# Patient Record
Sex: Female | Born: 1980 | Race: White | Hispanic: No | Marital: Single | Smoking: Current every day smoker
Health system: Southern US, Community
[De-identification: ages and names within clinical notes are randomized; demographics above are authoritative.]

## PROBLEM LIST (undated history)

## (undated) DIAGNOSIS — R569 Unspecified convulsions: Secondary | ICD-10-CM

## (undated) DIAGNOSIS — G4700 Insomnia, unspecified: Secondary | ICD-10-CM

## (undated) DIAGNOSIS — F1910 Other psychoactive substance abuse, uncomplicated: Secondary | ICD-10-CM

## (undated) DIAGNOSIS — F329 Major depressive disorder, single episode, unspecified: Secondary | ICD-10-CM

## (undated) DIAGNOSIS — F419 Anxiety disorder, unspecified: Secondary | ICD-10-CM

## (undated) DIAGNOSIS — F32A Depression, unspecified: Secondary | ICD-10-CM

## (undated) HISTORY — PX: SHOULDER SURGERY: SHX246

## (undated) HISTORY — PX: NO PAST SURGERIES: SHX2092

## (undated) HISTORY — PX: WISDOM TOOTH EXTRACTION: SHX21

---

## 2011-07-19 ENCOUNTER — Emergency Department (HOSPITAL_BASED_OUTPATIENT_CLINIC_OR_DEPARTMENT_OTHER)
Admission: EM | Admit: 2011-07-19 | Discharge: 2011-07-19 | Disposition: A | Discharge status: Home / Self Care | Payer: BC Managed Care – PPO | Attending: Emergency Medicine | Admitting: Emergency Medicine

## 2011-07-19 ENCOUNTER — Encounter: Payer: Self-pay | Admitting: *Deleted

## 2011-07-19 DIAGNOSIS — F411 Generalized anxiety disorder: Secondary | ICD-10-CM | POA: Insufficient documentation

## 2011-07-19 DIAGNOSIS — F17200 Nicotine dependence, unspecified, uncomplicated: Secondary | ICD-10-CM | POA: Insufficient documentation

## 2011-07-19 DIAGNOSIS — F419 Anxiety disorder, unspecified: Secondary | ICD-10-CM

## 2011-07-19 DIAGNOSIS — Z760 Encounter for issue of repeat prescription: Secondary | ICD-10-CM | POA: Insufficient documentation

## 2011-07-19 HISTORY — DX: Insomnia, unspecified: G47.00

## 2011-07-19 HISTORY — DX: Anxiety disorder, unspecified: F41.9

## 2011-07-19 MED ORDER — CLONAZEPAM 2 MG PO TABS: 2.0000 mg | ORAL_TABLET | Freq: Two times a day (BID) | ORAL | Status: DC
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
## 2011-07-19 NOTE — ED Notes (Signed)
Pt requesting refill of clonopin. Pt states she has been out x 4 days. Pt tearful.

## 2011-07-19 NOTE — ED Provider Notes (Signed)
Medical screening examination/treatment/procedure(s) were conducted as a shared visit with non-physician practitioner(s) and myself.  I personally evaluated the patient during the encounter   Celene Kras, MD 07/19/11 [phone]

## 2011-11-11 ENCOUNTER — Encounter (HOSPITAL_BASED_OUTPATIENT_CLINIC_OR_DEPARTMENT_OTHER): Payer: Self-pay

## 2011-11-11 ENCOUNTER — Emergency Department (HOSPITAL_BASED_OUTPATIENT_CLINIC_OR_DEPARTMENT_OTHER)
Admission: EM | Admit: 2011-11-11 | Discharge: 2011-11-11 | Disposition: A | Discharge status: Home / Self Care | Payer: Self-pay | Attending: Emergency Medicine | Admitting: Emergency Medicine

## 2011-11-11 ENCOUNTER — Emergency Department (INDEPENDENT_AMBULATORY_CARE_PROVIDER_SITE_OTHER): Payer: Self-pay

## 2011-11-11 DIAGNOSIS — M549 Dorsalgia, unspecified: Secondary | ICD-10-CM

## 2011-11-11 DIAGNOSIS — R079 Chest pain, unspecified: Secondary | ICD-10-CM

## 2011-11-11 DIAGNOSIS — M25519 Pain in unspecified shoulder: Secondary | ICD-10-CM

## 2011-11-11 DIAGNOSIS — F411 Generalized anxiety disorder: Secondary | ICD-10-CM | POA: Insufficient documentation

## 2011-11-11 DIAGNOSIS — Z79899 Other long term (current) drug therapy: Secondary | ICD-10-CM | POA: Insufficient documentation

## 2011-11-11 DIAGNOSIS — F17200 Nicotine dependence, unspecified, uncomplicated: Secondary | ICD-10-CM | POA: Insufficient documentation

## 2011-11-11 LAB — BASIC METABOLIC PANEL
BUN: 17 mg/dL (ref 6–23)
Creatinine, Ser: 0.7 mg/dL (ref 0.50–1.10)
GFR calc Af Amer: >90 mL/min (ref >90)
GFR calc non Af Amer: >90 mL/min (ref >90)

## 2011-11-11 LAB — CBC
Hemoglobin: 12.2 g/dL (ref 12.0–15.0)
MCH: 32.4 pg (ref 26.0–34.0)
MCHC: 35 g/dL (ref 30.0–36.0)
RDW: 11.3 % — ABNORMAL LOW (ref 11.5–15.5)

## 2011-11-11 LAB — DIFFERENTIAL
Basophils Relative: 0 % (ref 0–1)
Eosinophils Absolute: 0 10*3/uL (ref 0.0–0.7)
Monocytes Absolute: 0.4 10*3/uL (ref 0.1–1.0)
Monocytes Relative: 8 % (ref 3–12)
Neutrophils Relative %: 75 % (ref 43–77)

## 2011-11-11 MED ORDER — DIAZEPAM 5 MG PO TABS: 5.0000 mg | ORAL_TABLET | Freq: Three times a day (TID) | ORAL | Status: AC | PRN

## 2011-11-11 MED ORDER — IOHEXOL 350 MG/ML SOLN
80.0000 mL | Freq: Once | INTRAVENOUS | Status: AC | PRN
Start: 2011-11-11 — End: 2011-11-11
  Administered 2011-11-11: 80 mL via INTRAVENOUS

## 2011-11-11 MED ORDER — OXYCODONE-ACETAMINOPHEN 5-325 MG PO TABS: 1.0000 | ORAL_TABLET | ORAL | Status: AC | PRN

## 2011-11-11 MED ORDER — OXYCODONE-ACETAMINOPHEN 5-325 MG PO TABS
1.0000 | ORAL_TABLET | Freq: Once | ORAL | Status: AC
  Administered 2011-11-11: 1 via ORAL
  Filled 2011-11-11: qty 1

## 2011-11-11 MED ORDER — METHOCARBAMOL 500 MG PO TABS
1000.0000 mg | ORAL_TABLET | Freq: Once | ORAL | Status: AC
  Administered 2011-11-11: 1000 mg via ORAL
  Filled 2011-11-11: qty 2

## 2011-11-11 MED ORDER — SODIUM CHLORIDE 0.9 % IV BOLUS (SEPSIS)
1000.0000 mL | Freq: Once | INTRAVENOUS | Status: AC
  Administered 2011-11-11: 1000 mL via INTRAVENOUS

## 2011-11-11 NOTE — ED Notes (Signed)
Patient transported to X-ray 
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
## 2011-11-11 NOTE — ED Notes (Signed)
Pt returned from CT.  States improvement after PO medication. PO fluids offered. Pt informed of plan of care.  Awaiting CT results.

## 2011-11-11 NOTE — ED Notes (Signed)
Patient transported to CT 
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
## 2011-11-11 NOTE — ED Notes (Signed)
MD at bedside. 

## 2012-11-18 ENCOUNTER — Emergency Department (HOSPITAL_COMMUNITY)
Admission: EM | Admit: 2012-11-18 | Discharge: 2012-11-18 | Disposition: A | Discharge status: Home / Self Care | Payer: Self-pay | Attending: Emergency Medicine | Admitting: Emergency Medicine

## 2012-11-18 ENCOUNTER — Emergency Department (HOSPITAL_COMMUNITY): Payer: Self-pay

## 2012-11-18 ENCOUNTER — Encounter (HOSPITAL_COMMUNITY): Payer: Self-pay | Admitting: Emergency Medicine

## 2012-11-18 DIAGNOSIS — F411 Generalized anxiety disorder: Secondary | ICD-10-CM | POA: Insufficient documentation

## 2012-11-18 DIAGNOSIS — S298XXA Other specified injuries of thorax, initial encounter: Secondary | ICD-10-CM | POA: Insufficient documentation

## 2012-11-18 DIAGNOSIS — T07XXXA Unspecified multiple injuries, initial encounter: Secondary | ICD-10-CM

## 2012-11-18 DIAGNOSIS — F17200 Nicotine dependence, unspecified, uncomplicated: Secondary | ICD-10-CM | POA: Insufficient documentation

## 2012-11-18 DIAGNOSIS — S6990XA Unspecified injury of unspecified wrist, hand and finger(s), initial encounter: Secondary | ICD-10-CM | POA: Insufficient documentation

## 2012-11-18 DIAGNOSIS — IMO0002 Reserved for concepts with insufficient information to code with codable children: Secondary | ICD-10-CM | POA: Insufficient documentation

## 2012-11-18 DIAGNOSIS — S0993XA Unspecified injury of face, initial encounter: Secondary | ICD-10-CM | POA: Insufficient documentation

## 2012-11-18 DIAGNOSIS — R0781 Pleurodynia: Secondary | ICD-10-CM

## 2012-11-18 MED ORDER — NAPROXEN 500 MG PO TABS: 500.0000 mg | ORAL_TABLET | Freq: Two times a day (BID) | ORAL | Status: DC

## 2012-11-18 MED ORDER — OXYCODONE-ACETAMINOPHEN 5-325 MG PO TABS
2.0000 | ORAL_TABLET | Freq: Once | ORAL | Status: AC
  Administered 2012-11-18: 2 via ORAL
  Filled 2012-11-18: qty 2

## 2012-11-18 MED ORDER — HYDROCODONE-ACETAMINOPHEN 5-325 MG PO TABS: 2.0000 | ORAL_TABLET | Freq: Four times a day (QID) | ORAL | Status: DC | PRN
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
## 2012-11-19 NOTE — ED Provider Notes (Signed)
Medical screening examination/treatment/procedure(s) were performed by non-physician practitioner and as supervising physician I was immediately available for consultation/collaboration.   Dione Booze, MD 11/19/12 [phone]

## 2012-12-03 ENCOUNTER — Emergency Department (HOSPITAL_COMMUNITY): Payer: Self-pay

## 2012-12-03 ENCOUNTER — Emergency Department (HOSPITAL_COMMUNITY)
Admission: EM | Admit: 2012-12-03 | Discharge: 2012-12-03 | Disposition: A | Discharge status: Home / Self Care | Payer: Self-pay | Attending: Emergency Medicine | Admitting: Emergency Medicine

## 2012-12-03 ENCOUNTER — Encounter (HOSPITAL_COMMUNITY): Payer: Self-pay | Admitting: *Deleted

## 2012-12-03 DIAGNOSIS — F17200 Nicotine dependence, unspecified, uncomplicated: Secondary | ICD-10-CM | POA: Insufficient documentation

## 2012-12-03 DIAGNOSIS — S8990XA Unspecified injury of unspecified lower leg, initial encounter: Secondary | ICD-10-CM | POA: Insufficient documentation

## 2012-12-03 DIAGNOSIS — M25561 Pain in right knee: Secondary | ICD-10-CM

## 2012-12-03 DIAGNOSIS — M25572 Pain in left ankle and joints of left foot: Secondary | ICD-10-CM

## 2012-12-03 DIAGNOSIS — S298XXA Other specified injuries of thorax, initial encounter: Secondary | ICD-10-CM | POA: Insufficient documentation

## 2012-12-03 DIAGNOSIS — Z79899 Other long term (current) drug therapy: Secondary | ICD-10-CM | POA: Insufficient documentation

## 2012-12-03 DIAGNOSIS — R0789 Other chest pain: Secondary | ICD-10-CM

## 2012-12-03 DIAGNOSIS — F411 Generalized anxiety disorder: Secondary | ICD-10-CM | POA: Insufficient documentation

## 2012-12-03 MED ORDER — HYDROCODONE-ACETAMINOPHEN 5-325 MG PO TABS
1.0000 | ORAL_TABLET | Freq: Once | ORAL | Status: AC
  Administered 2012-12-03: 1 via ORAL
  Filled 2012-12-03: qty 1

## 2012-12-03 MED ORDER — HYDROCODONE-ACETAMINOPHEN 5-325 MG PO TABS: 1.0000 | ORAL_TABLET | Freq: Four times a day (QID) | ORAL | Status: DC | PRN

## 2012-12-03 MED ORDER — IBUPROFEN 600 MG PO TABS: 600.0000 mg | ORAL_TABLET | Freq: Three times a day (TID) | ORAL | Status: DC | PRN

## 2012-12-03 MED ORDER — IBUPROFEN 400 MG PO TABS
600.0000 mg | ORAL_TABLET | Freq: Once | ORAL | Status: AC
  Administered 2012-12-03: 600 mg via ORAL
  Filled 2012-12-03: qty 1

## 2012-12-03 NOTE — Progress Notes (Signed)
Orthopedic Tech Progress Note Patient Details:  Rita Carey [birth year] [id number]  Ortho Devices Type of Ortho Device: Crutches Ortho Device/Splint Interventions: Application   Nikki Dom 12/03/2012, 6:24 PM
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
## 2012-12-03 NOTE — ED Notes (Signed)
Ortho paged. 
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
## 2013-07-03 ENCOUNTER — Encounter (HOSPITAL_COMMUNITY): Payer: Self-pay | Admitting: Emergency Medicine

## 2013-07-03 ENCOUNTER — Emergency Department (HOSPITAL_COMMUNITY)
Admission: EM | Admit: 2013-07-03 | Discharge: 2013-07-04 | Disposition: A | Discharge status: Other Acute Inpatient Hospital | Payer: BC Managed Care – PPO | Attending: Emergency Medicine | Admitting: Emergency Medicine

## 2013-07-03 DIAGNOSIS — G4700 Insomnia, unspecified: Secondary | ICD-10-CM | POA: Insufficient documentation

## 2013-07-03 DIAGNOSIS — F1910 Other psychoactive substance abuse, uncomplicated: Secondary | ICD-10-CM

## 2013-07-03 DIAGNOSIS — Z79899 Other long term (current) drug therapy: Secondary | ICD-10-CM | POA: Insufficient documentation

## 2013-07-03 DIAGNOSIS — Z3202 Encounter for pregnancy test, result negative: Secondary | ICD-10-CM | POA: Insufficient documentation

## 2013-07-03 DIAGNOSIS — F411 Generalized anxiety disorder: Secondary | ICD-10-CM | POA: Insufficient documentation

## 2013-07-03 DIAGNOSIS — F17200 Nicotine dependence, unspecified, uncomplicated: Secondary | ICD-10-CM | POA: Insufficient documentation

## 2013-07-03 DIAGNOSIS — F1110 Opioid abuse, uncomplicated: Secondary | ICD-10-CM | POA: Insufficient documentation

## 2013-07-03 LAB — COMPREHENSIVE METABOLIC PANEL
ALT: 8 U/L (ref 0–35)
AST: 20 U/L (ref 0–37)
Alkaline Phosphatase: 70 U/L (ref 39–117)
BUN: 8 mg/dL (ref 6–23)
CO2: 26 mEq/L (ref 19–32)
GFR calc Af Amer: >90 mL/min (ref >90)
GFR calc non Af Amer: >90 mL/min (ref >90)
Glucose, Bld: 93 mg/dL (ref 70–99)
Potassium: 3.7 mEq/L (ref 3.5–5.1)
Sodium: 138 mEq/L (ref 135–145)
Total Protein: 7 g/dL (ref 6.0–8.3)

## 2013-07-03 LAB — POCT PREGNANCY, URINE: Preg Test, Ur: NEGATIVE

## 2013-07-03 LAB — CBC
Hemoglobin: 12.6 g/dL (ref 12.0–15.0)
MCHC: 34.8 g/dL (ref 30.0–36.0)
Platelets: 254 10*3/uL (ref 150–400)
RBC: 3.89 MIL/uL (ref 3.87–5.11)

## 2013-07-03 LAB — RAPID URINE DRUG SCREEN, HOSP PERFORMED
Amphetamines: NOT DETECTED
Barbiturates: NOT DETECTED
Cocaine: NOT DETECTED

## 2013-07-03 LAB — ETHANOL: Alcohol, Ethyl (B): <11 mg/dL (ref 0–11)

## 2013-07-03 MED ORDER — ALUM & MAG HYDROXIDE-SIMETH 200-200-20 MG/5ML PO SUSP: 30.0000 mL | ORAL | Status: DC | PRN

## 2013-07-03 MED ORDER — ZOLPIDEM TARTRATE 5 MG PO TABS: 5.0000 mg | ORAL_TABLET | Freq: Every evening | ORAL | Status: DC | PRN

## 2013-07-03 MED ORDER — IBUPROFEN 200 MG PO TABS: 600.0000 mg | ORAL_TABLET | Freq: Three times a day (TID) | ORAL | Status: DC | PRN

## 2013-07-03 MED ORDER — LORAZEPAM 1 MG PO TABS
1.0000 mg | ORAL_TABLET | Freq: Once | ORAL | Status: AC
  Administered 2013-07-03: 1 mg via ORAL
  Filled 2013-07-03: qty 1

## 2013-07-03 MED ORDER — LORAZEPAM 1 MG PO TABS: 1.0000 mg | ORAL_TABLET | Freq: Three times a day (TID) | ORAL | Status: DC | PRN

## 2013-07-03 MED ORDER — NICOTINE 21 MG/24HR TD PT24: 21.0000 mg | MEDICATED_PATCH | Freq: Every day | TRANSDERMAL | Status: DC

## 2013-07-03 MED ORDER — ONDANSETRON HCL 4 MG PO TABS: 4.0000 mg | ORAL_TABLET | Freq: Three times a day (TID) | ORAL | Status: DC | PRN
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
## 2013-07-03 NOTE — ED Notes (Signed)
PT HAS 2 WALLETS AND 2 PHONES.

## 2013-07-03 NOTE — ED Notes (Signed)
PT AND BELONGINGS SEARCHED AND WANDED BY SECURITY

## 2013-07-03 NOTE — ED Notes (Signed)
telepsych in progress 

## 2013-07-03 NOTE — Progress Notes (Signed)
Patient confirms she does not have a pcp.  Patient confirms she has a psychiatrist Dr. Jiles Crocker.

## 2013-07-03 NOTE — ED Provider Notes (Signed)
CSN: [id number]     Arrival date & time 07/03/13  1836 History   First MD Initiated Contact with Patient 07/03/13 1907     Chief Complaint  Patient presents with  . Medical Clearance   (Consider location/radiation/quality/duration/timing/severity/associated sxs/prior Treatment) HPI Comments: 32 year old female with a past medical history of heroin abuse and anxiety presents to the emergency department requesting detox from heroin. Patient states she's been using heroine "for a while", last used at 1:00 this morning. Also admits to abusing methadone and alcohol, drinks about half a pint of alcohol daily. No history of withdrawal seizures. States she lost her her mom and that is why she began using drugs again. She went through withdrawal 5 years ago the hospital in [place]. Denies suicidal or homicidal ideations. She is very anxious about going through detox. States she's compliant with her anti-anxiety and insomnia medications. Denies any other medical problems.  The history is provided by the patient.    Past Medical History  Diagnosis Date  . Insomnia   . Anxiety   . Anxiety    History reviewed. No pertinent past surgical history. No family history on file. History  Substance Use Topics  . Smoking status: Current Every Day Smoker -- 1.00 packs/day  . Smokeless tobacco: Not on file  . Alcohol Use: No   OB History   Grav Para Term Preterm Abortions TAB SAB Ect Mult Living                 Review of Systems  Psychiatric/Behavioral: Negative for suicidal ideas. The patient is nervous/anxious.        Positive for drug abuse.  All other systems reviewed and are negative.    Allergies  Review of patient's allergies indicates no known allergies.  Home Medications   Current Outpatient Rx  Name  Route  Sig  Dispense  Refill  . clonazePAM (KLONOPIN) 2 MG tablet   Oral   Take 1-2 mg by mouth 2 (two) times daily as needed. Take 1 tab in the morning and at bedtime and take 1/2 tab in  the afternoon          . zolpidem (AMBIEN) 10 MG tablet   Oral   Take 10 mg by mouth at bedtime as needed for sleep.          BP 122/73  Pulse 85  Temp(Src) 99.2 F (37.3 C) (Oral)  Resp 21  SpO2 99%  LMP 07/03/2013 Physical Exam  Nursing note and vitals reviewed. Constitutional: She is oriented to person, place, and time. She appears well-developed and well-nourished. No distress.  Shaky, tearful.  HENT:  Head: Normocephalic and atraumatic.  Mouth/Throat: Oropharynx is clear and moist.  Eyes: Conjunctivae and EOM are normal. Pupils are equal, round, and reactive to light.  Neck: Normal range of motion. Neck supple.  Cardiovascular: Normal rate, regular rhythm and normal heart sounds.   Pulmonary/Chest: Effort normal and breath sounds normal.  Abdominal: Soft. Bowel sounds are normal. There is no tenderness.  Musculoskeletal: Normal range of motion. She exhibits no edema.  Neurological: She is alert and oriented to person, place, and time.  Skin: Skin is warm and dry. She is not diaphoretic.  Psychiatric: Her speech is normal and behavior is normal. Thought content normal. Her mood appears anxious. She expresses no homicidal and no suicidal ideation.    ED Course  Procedures (including critical care time) Labs Review Labs Reviewed  COMPREHENSIVE METABOLIC PANEL - Abnormal; Notable for the following:  Total Bilirubin 0.2 (*)    All other components within normal limits  CBC  ETHANOL  URINE RAPID DRUG SCREEN (HOSP PERFORMED)   Imaging Review No results found.  MDM   1. Polysubstance abuse      Patient with polysubstance abuse, anxiety, requesting detox. She is very anxious, tearful. Renda Rolls with ACT team evaluated patient and feels she needs inpatient detox. Psych labs pending.   Patient medically cleared. She was accepted at behavioral health.  Trevor Mace, PA-C 07/03/13 2300
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
## 2013-07-03 NOTE — BH Assessment (Signed)
Notified by Oneita Hurt at [facility]. Pt has been accepted to [facility] by Janann August NP, assigned to bed 303-1, Attending will be Dr. Dub Mikes for inpatient detox treatment. All Appropriate support documentation is complete to include Consent to Release, Voluntary Admission form, and checklist. Pt's nurse Autumn was informed and original support documentation given to pt's nurse. Pt's support documentation was faxed to [facility].   Glorious Peach, MS, LCASA Assessment Counselor

## 2013-07-04 ENCOUNTER — Encounter (HOSPITAL_COMMUNITY): Payer: Self-pay | Admitting: *Deleted

## 2013-07-04 ENCOUNTER — Inpatient Hospital Stay (HOSPITAL_COMMUNITY)
Admission: AD | Admit: 2013-07-04 | Discharge: 2013-07-10 | DRG: 897 | Disposition: A | Discharge status: Home / Self Care | Payer: No Typology Code available for payment source | Source: Intra-hospital | Attending: Psychiatry | Admitting: Psychiatry

## 2013-07-04 DIAGNOSIS — F1994 Other psychoactive substance use, unspecified with psychoactive substance-induced mood disorder: Secondary | ICD-10-CM | POA: Diagnosis present

## 2013-07-04 DIAGNOSIS — F1920 Other psychoactive substance dependence, uncomplicated: Secondary | ICD-10-CM | POA: Diagnosis present

## 2013-07-04 DIAGNOSIS — F410 Panic disorder [episodic paroxysmal anxiety] without agoraphobia: Secondary | ICD-10-CM | POA: Diagnosis present

## 2013-07-04 DIAGNOSIS — F10239 Alcohol dependence with withdrawal, unspecified: Secondary | ICD-10-CM

## 2013-07-04 DIAGNOSIS — F19939 Other psychoactive substance use, unspecified with withdrawal, unspecified: Principal | ICD-10-CM | POA: Diagnosis present

## 2013-07-04 DIAGNOSIS — F1193 Opioid use, unspecified with withdrawal: Secondary | ICD-10-CM | POA: Diagnosis present

## 2013-07-04 DIAGNOSIS — F39 Unspecified mood [affective] disorder: Secondary | ICD-10-CM

## 2013-07-04 DIAGNOSIS — G4700 Insomnia, unspecified: Secondary | ICD-10-CM | POA: Diagnosis present

## 2013-07-04 DIAGNOSIS — F1120 Opioid dependence, uncomplicated: Secondary | ICD-10-CM

## 2013-07-04 DIAGNOSIS — F1020 Alcohol dependence, uncomplicated: Secondary | ICD-10-CM

## 2013-07-04 DIAGNOSIS — F19239 Other psychoactive substance dependence with withdrawal, unspecified: Secondary | ICD-10-CM

## 2013-07-04 DIAGNOSIS — Z79899 Other long term (current) drug therapy: Secondary | ICD-10-CM

## 2013-07-04 MED ORDER — ACETAMINOPHEN 325 MG PO TABS
650.0000 mg | ORAL_TABLET | Freq: Four times a day (QID) | ORAL | Status: DC | PRN
Start: 2013-07-04 — End: 2013-07-10
  Administered 2013-07-04 – 2013-07-09 (×3): 650 mg via ORAL

## 2013-07-04 MED ORDER — MAGNESIUM HYDROXIDE 400 MG/5ML PO SUSP: 30.0000 mL | Freq: Every day | ORAL | Status: DC | PRN

## 2013-07-04 MED ORDER — HYDROXYZINE HCL 25 MG PO TABS: 25.0000 mg | ORAL_TABLET | Freq: Four times a day (QID) | ORAL | Status: DC | PRN

## 2013-07-04 MED ORDER — NAPROXEN 500 MG PO TABS
500.0000 mg | ORAL_TABLET | Freq: Two times a day (BID) | ORAL | Status: AC | PRN
  Administered 2013-07-07 (×2): 500 mg via ORAL
  Filled 2013-07-04 (×2): qty 1

## 2013-07-04 MED ORDER — THIAMINE HCL 100 MG/ML IJ SOLN: 100.0000 mg | Freq: Once | INTRAMUSCULAR | Status: DC

## 2013-07-04 MED ORDER — ADULT MULTIVITAMIN W/MINERALS CH
1.0000 | ORAL_TABLET | Freq: Every day | ORAL | Status: DC
  Administered 2013-07-04 – 2013-07-10 (×7): 1 via ORAL
  Filled 2013-07-04 (×9): qty 1

## 2013-07-04 MED ORDER — ONDANSETRON 4 MG PO TBDP: 4.0000 mg | ORAL_TABLET | Freq: Four times a day (QID) | ORAL | Status: AC | PRN

## 2013-07-04 MED ORDER — TRAZODONE HCL 50 MG PO TABS
50.0000 mg | ORAL_TABLET | Freq: Every day | ORAL | Status: DC
  Filled 2013-07-04 (×5): qty 1

## 2013-07-04 MED ORDER — CLONIDINE HCL 0.1 MG PO TABS
0.1000 mg | ORAL_TABLET | ORAL | Status: AC
  Administered 2013-07-07 – 2013-07-08 (×4): 0.1 mg via ORAL
  Filled 2013-07-04 (×5): qty 1

## 2013-07-04 MED ORDER — DICYCLOMINE HCL 20 MG PO TABS: 20.0000 mg | ORAL_TABLET | Freq: Four times a day (QID) | ORAL | Status: AC | PRN

## 2013-07-04 MED ORDER — CLONIDINE HCL 0.1 MG PO TABS
0.1000 mg | ORAL_TABLET | Freq: Four times a day (QID) | ORAL | Status: AC
  Administered 2013-07-04 – 2013-07-06 (×8): 0.1 mg via ORAL
  Filled 2013-07-04 (×12): qty 1

## 2013-07-04 MED ORDER — ONDANSETRON 4 MG PO TBDP
4.0000 mg | ORAL_TABLET | Freq: Four times a day (QID) | ORAL | Status: DC | PRN
  Administered 2013-07-04: 4 mg via ORAL

## 2013-07-04 MED ORDER — CHLORDIAZEPOXIDE HCL 25 MG PO CAPS
25.0000 mg | ORAL_CAPSULE | Freq: Four times a day (QID) | ORAL | Status: AC
  Administered 2013-07-04 – 2013-07-05 (×6): 25 mg via ORAL
  Filled 2013-07-04 (×6): qty 1

## 2013-07-04 MED ORDER — METHOCARBAMOL 500 MG PO TABS
500.0000 mg | ORAL_TABLET | Freq: Three times a day (TID) | ORAL | Status: AC | PRN
  Administered 2013-07-06 – 2013-07-08 (×6): 500 mg via ORAL
  Filled 2013-07-04 (×6): qty 1

## 2013-07-04 MED ORDER — VITAMIN B-1 100 MG PO TABS
100.0000 mg | ORAL_TABLET | Freq: Every day | ORAL | Status: DC
  Administered 2013-07-05 – 2013-07-10 (×6): 100 mg via ORAL
  Filled 2013-07-04 (×8): qty 1

## 2013-07-04 MED ORDER — BOOST / RESOURCE BREEZE PO LIQD
1.0000 | Freq: Two times a day (BID) | ORAL | Status: DC
  Administered 2013-07-04 – 2013-07-10 (×13): 1 via ORAL
  Filled 2013-07-04 (×15): qty 1

## 2013-07-04 MED ORDER — CHLORDIAZEPOXIDE HCL 25 MG PO CAPS
25.0000 mg | ORAL_CAPSULE | Freq: Every day | ORAL | Status: AC
  Administered 2013-07-08: 25 mg via ORAL
  Filled 2013-07-04: qty 1

## 2013-07-04 MED ORDER — HYDROXYZINE HCL 50 MG PO TABS: 50.0000 mg | ORAL_TABLET | Freq: Three times a day (TID) | ORAL | Status: DC | PRN

## 2013-07-04 MED ORDER — NICOTINE 21 MG/24HR TD PT24
21.0000 mg | MEDICATED_PATCH | Freq: Every day | TRANSDERMAL | Status: DC
  Administered 2013-07-04 – 2013-07-10 (×7): 21 mg via TRANSDERMAL
  Filled 2013-07-04 (×11): qty 1

## 2013-07-04 MED ORDER — CLONIDINE HCL 0.1 MG PO TABS
0.1000 mg | ORAL_TABLET | Freq: Every day | ORAL | Status: AC
  Administered 2013-07-09 – 2013-07-10 (×2): 0.1 mg via ORAL
  Filled 2013-07-04 (×2): qty 1

## 2013-07-04 MED ORDER — CHLORDIAZEPOXIDE HCL 25 MG PO CAPS
25.0000 mg | ORAL_CAPSULE | Freq: Three times a day (TID) | ORAL | Status: AC
  Administered 2013-07-05 – 2013-07-06 (×3): 25 mg via ORAL
  Filled 2013-07-04 (×2): qty 1

## 2013-07-04 MED ORDER — CHLORDIAZEPOXIDE HCL 25 MG PO CAPS
25.0000 mg | ORAL_CAPSULE | Freq: Four times a day (QID) | ORAL | Status: AC | PRN
  Administered 2013-07-05: 25 mg via ORAL
  Filled 2013-07-04: qty 1

## 2013-07-04 MED ORDER — CHLORDIAZEPOXIDE HCL 25 MG PO CAPS
25.0000 mg | ORAL_CAPSULE | ORAL | Status: AC
  Administered 2013-07-06 – 2013-07-07 (×2): 25 mg via ORAL
  Filled 2013-07-04 (×2): qty 1

## 2013-07-04 MED ORDER — LOPERAMIDE HCL 2 MG PO CAPS: 2.0000 mg | ORAL_CAPSULE | ORAL | Status: DC | PRN

## 2013-07-04 MED ORDER — ALUM & MAG HYDROXIDE-SIMETH 200-200-20 MG/5ML PO SUSP: 30.0000 mL | ORAL | Status: DC | PRN

## 2013-07-04 MED ORDER — ZOLPIDEM TARTRATE 5 MG PO TABS: 5.0000 mg | ORAL_TABLET | Freq: Every evening | ORAL | Status: DC | PRN

## 2013-07-04 NOTE — Progress Notes (Signed)
Patient did attend the evening karaoke group. Pt was engaged, supportive, and participated by singing two songs.   

## 2013-07-04 NOTE — Tx Team (Signed)
Initial Interdisciplinary Treatment Plan  PATIENT STRENGTHS: (choose at least two) Ability for insight Active sense of humor Average or above average intelligence Capable of independent living Communication skills General fund of knowledge Motivation for treatment/growth Physical Health Special hobby/interest Supportive family/friends Work skills  PATIENT STRESSORS: Educational concerns Financial difficulties Occupational concerns Substance abuse   PROBLEM LIST: Problem List/Patient Goals Date to be addressed Date deferred Reason deferred Estimated date of resolution  "To get thru my detox in a safe space" 07/04/13     "self help classes eat better, balance a checkbook"            Substance abuse 07/04/13     Increased risk for suicide 07/04/13                              DISCHARGE CRITERIA:  Ability to meet basic life and health needs Adequate post-discharge living arrangements Improved stabilization in mood, thinking, and/or behavior Motivation to continue treatment in a less acute level of care Need for constant or close observation no longer present Reduction of life-threatening or endangering symptoms to within safe limits Safe-care adequate arrangements made Verbal commitment to aftercare and medication compliance Withdrawal symptoms are absent or subacute and managed without 24-hour nursing intervention  PRELIMINARY DISCHARGE PLAN: Attend 12-step recovery group Outpatient therapy Return to previous living arrangement  PATIENT/FAMIILY INVOLVEMENT: This treatment plan has been presented to and reviewed with the patient, SWARA DONZE, and/or family member.  The patient and family have been given the opportunity to ask questions and make suggestions.  Fransico Michael Laverne 07/04/2013, 1:16 AM
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
## 2013-07-04 NOTE — Progress Notes (Signed)
Recreation Therapy Notes  Date: 09.25.2014 Time: 2:30pm Location: [address] Hall Dayroom  Group Topic: Software engineer Activities (AAA)  Behavioral Response: Engaged, Attentive, Appropriate  Affect: Euthymic  Clinical Observations/Feedback: Dog Team: [employer] Patient interacted appropriately with peer, dog team, LRT and MHT.   [name] Patrica Mendell, LRT/CTRS  Kendarius Vigen L 07/04/2013 5:03 PM

## 2013-07-04 NOTE — Progress Notes (Signed)
Patient ID: ENZLEY KITCHENS, female   DOB: [birth year], 32 y.o.   MRN: [id number]  D: Pt was pleasant and cooperative, but flat, and slightly anxious during the adm process. States she drinks either liquor and/or beer daily. 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  Stated she asked for time off so "she could get through something". Stated that lately she's been spending all her money on etoh and drugs, then has to borrow to pay her bills. Stated she just "started drinking heavily after she got involved with her girlfriend". Pt stated that she and her girlfriend "recently went their separate ways". However, writer was informed that pt called another pt on the [address] hall. Pt admitted to visiting on [address] several days ago. It is believed that pt's ex/present girlfriend is a pt on the [address].  Pt also list the death of her mother in 05-Jun-2014as a stressor.

## 2013-07-04 NOTE — Progress Notes (Signed)
D: Patient appropriate and cooperative with staff. Patient's affect/mood is anxious. She complained of nausea/vomiting. Patient reported on the self inventory sheet that her sleep and appetite is poor, energy level is low and ability to pay attention is improving. She rated depression and hopelessness as non applicable. Patient attended one morning group. Compliant with medication regimen.  A: Support and encouragement provided to patient. Scheduled medications administered per MD orders. PRN Zofran given for nausea/vomiting. Maintain Q15 minute checks for safety.  R: Patient receptive. Reported relief from nausea/vomiting symptoms. Denies SI/HI/AVH. Patient remains safe.

## 2013-07-04 NOTE — H&P (Signed)
Psychiatric Admission Assessment Adult  Patient Identification:  Rita Carey Date of Evaluation:  07/04/2013 Chief Complaint:  ALCOHOL DEPENDENCE History of Present Illness: 32 year old WF presented to [facility] requesting detox from Heroin, methadone, alcohol and also increased anxiety. She had been clean for 6 years until her mother died in 15-Mar-2023 of this year. She relapsed shortly after that and now reports that she uses 5-6 bags of heroin a day, when she can get it she will drink methadone and prefers this because she says it lasts longer and she is able to work. She can not quantify how much she used last, and feels that it was about 10 days ago. She last used Heroine 48 hours ago. Xaviera also reports drinking 1/2 pint of liquor every 3 days or so. She states she has never had DTs but she has had blackouts.  She denies alcohol withdrawal seizures.       Rita Carey also takes Welbutrin, Klonopin and Ambien given to her by [employer].       She denies SI/HI or AVH. She is however grieving the loss of her mother.  Elements:  Location:  Adult unit in patient. Quality:  chronic. Severity:  severe. Timing:  6 months. Duration:  6 months. Context:  patient notes a decrease in her job performance, worsening her current relationship, weight loss, derease in her ADLs, outburst of anger with mood labi. ility. Associated Signs/Synptoms: Depression Symptoms:  anhedonia, psychomotor agitation, difficulty concentrating, (Hypo) Manic Symptoms:  Distractibility, Impulsivity, Irritable Mood, Labiality of Mood, Anxiety Symptoms:  Excessive Worry, Psychotic Symptoms:  None PTSD Symptoms: Had a traumatic exposure:  does not disclose  Psychiatric Specialty Exam: Physical Exam  Constitutional: She appears well-developed and well-nourished.  HENT:  Head: Normocephalic and atraumatic.  Skin: She is diaphoretic.  Psychiatric: Her speech is normal and behavior is normal. Thought content normal. Her mood appears  anxious. Cognition and memory are normal. She expresses impulsivity and inappropriate judgment.  Patient is seen and the chart is reviewed. I agree with the findings of the exam completed in the ED by PA < 24 hours ago with no exceptions.    Review of Systems  Constitutional: Positive for chills, malaise/fatigue and diaphoresis.  HENT: Positive for congestion.   Eyes: Positive for blurred vision (coriza).  Cardiovascular: Negative.   Gastrointestinal: Positive for nausea, abdominal pain and diarrhea.  Genitourinary: Negative.   Musculoskeletal: Positive for myalgias and back pain.  Skin: Negative.   Neurological: Positive for tingling, weakness and headaches.  Endo/Heme/Allergies: Negative.   Psychiatric/Behavioral: Positive for substance abuse. Negative for depression, suicidal ideas, hallucinations and memory loss. The patient is nervous/anxious and has insomnia.     Blood pressure 110/82, pulse 84, temperature 98.5 F (36.9 C), temperature source Oral, resp. rate 16, height 5\' 3"  (1.6 m), weight 44.906 kg (99 lb), last menstrual period 07/03/2013.Body mass index is 17.54 kg/(m^2).  General Appearance: Disheveled  Eye Contact::  Good  Speech:  Clear and Coherent  Volume:  Normal  Mood:  Anxious  Affect:  Congruent  Thought Process:  Goal Directed  Orientation:  Full (Time, Place, and Person)  Thought Content:  Negative  Suicidal Thoughts:  No  Homicidal Thoughts:  No  Memory:  Immediate;   Fair  Judgement:  Impaired  Insight:  Shallow  Psychomotor Activity:  Restlessness and Tremor  Concentration:  Fair  Recall:  Fair  Akathisia:  No  Handed:  Right  AIMS (if indicated):     Assets:  Communication Skills Desire for Improvement Housing Physical Health  Sleep:  Number of Hours: 2.75    Past Psychiatric History: Diagnosis:PTSD, GAD  Hospitalizations:  [facility] at age 22,   Outpatient Care:   Methadone clinic x 6 years  Substance Abuse Care:  Self-Mutilation:   Punches walls, bangs head  Suicidal Attempts:  Violent Behaviors:   Past Medical History:   Past Medical History  Diagnosis Date  . Insomnia   . Anxiety   . Anxiety    None. Allergies:  No Known Allergies PTA Medications: Prescriptions prior to admission  Medication Sig Dispense Refill  . zolpidem (AMBIEN) 10 MG tablet Take 10 mg by mouth at bedtime as needed for sleep.      . clonazePAM (KLONOPIN) 2 MG tablet Take 1-2 mg by mouth 2 (two) times daily as needed. Take 1 tab in the morning and at bedtime and take 1/2 tab in the afternoon         Previous Psychotropic Medications:  Medication/Dose    Welbutrin    Klonopin  1 mg BID    Ambien           Substance Abuse History in the last 12 months:  yes  Consequences of Substance Abuse: Legal Consequences:  2nd DWI and several driving while license is revoked.  Social History:  reports that she has been smoking Cigarettes.  She has a 11 pack-year smoking history. She does not have any smokeless tobacco history on file. She reports that  drinks alcohol. She reports that she uses illicit drugs. Additional Social History: Pain Medications: none History of alcohol / drug use?: Yes Longest period of sobriety (when/how long): approx one yr, 3 yrs ago Negative Consequences of Use: Financial;Legal;Work / School;Personal relationships Withdrawal Symptoms: Sweats;Other (Comment) (insomnia, RLS) 2 - Last Use / Amount: "some years ago" Current Place of Residence:   Place of Birth:   Family Members: Marital Status:  Single Children:  Sons:  Daughters: Relationships: Education:  Corporate treasurer Problems/Performance: Religious Beliefs/Practices: History of Abuse (Emotional/Phsycial/Sexual) Occupational Experiences; Military History:  None. Legal History: Hobbies/Interests:  Family History:  History reviewed. No pertinent family history.  Results for orders placed during the hospital encounter of 07/03/13 (from the past  72 hour(s))  CBC     Status: None   Collection Time    07/03/13  8:35 PM      Result Value Range   WBC 5.0  4.0 - 10.5 K/uL   RBC 3.89  3.87 - 5.11 MIL/uL   Hemoglobin 12.6  12.0 - 15.0 g/dL   HCT 10.2  72.5 - 36.6 %   MCV 93.1  78.0 - 100.0 fL   MCH 32.4  26.0 - 34.0 pg   MCHC 34.8  30.0 - 36.0 g/dL   RDW 44.0  34.7 - 42.5 %   Platelets 254  150 - 400 K/uL  COMPREHENSIVE METABOLIC PANEL     Status: Abnormal   Collection Time    07/03/13  8:35 PM      Result Value Range   Sodium 138  135 - 145 mEq/L   Potassium 3.7  3.5 - 5.1 mEq/L   Chloride 101  96 - 112 mEq/L   CO2 26  19 - 32 mEq/L   Glucose, Bld 93  70 - 99 mg/dL   BUN 8  6 - 23 mg/dL   Creatinine, Ser 9.56  0.50 - 1.10 mg/dL   Calcium 9.2  8.4 - 38.7 mg/dL   Total  Protein 7.0  6.0 - 8.3 g/dL   Albumin 3.8  3.5 - 5.2 g/dL   AST 20  0 - 37 U/L   ALT 8  0 - 35 U/L   Alkaline Phosphatase 70  39 - 117 U/L   Total Bilirubin 0.2 (*) 0.3 - 1.2 mg/dL   GFR calc non Af Amer >90  >90 mL/min   GFR calc Af Amer >90  >90 mL/min   Comment: (NOTE)     The eGFR has been calculated using the CKD EPI equation.     This calculation has not been validated in all clinical situations.     eGFR's persistently <90 mL/min signify possible Chronic Kidney     Disease.  ETHANOL     Status: None   Collection Time    07/03/13  8:35 PM      Result Value Range   Alcohol, Ethyl (B) <11  0 - 11 mg/dL   Comment:            LOWEST DETECTABLE LIMIT FOR     SERUM ALCOHOL IS 11 mg/dL     FOR MEDICAL PURPOSES ONLY  URINE RAPID DRUG SCREEN (HOSP PERFORMED)     Status: Abnormal   Collection Time    07/03/13 10:52 PM      Result Value Range   Opiates POSITIVE (*) NONE DETECTED   Cocaine NONE DETECTED  NONE DETECTED   Benzodiazepines NONE DETECTED  NONE DETECTED   Amphetamines NONE DETECTED  NONE DETECTED   Tetrahydrocannabinol POSITIVE (*) NONE DETECTED   Barbiturates NONE DETECTED  NONE DETECTED   Comment:            DRUG SCREEN FOR MEDICAL  PURPOSES     ONLY.  IF CONFIRMATION IS NEEDED     FOR ANY PURPOSE, NOTIFY LAB     WITHIN 5 DAYS.                LOWEST DETECTABLE LIMITS     FOR URINE DRUG SCREEN     Drug Class       Cutoff (ng/mL)     Amphetamine      1000     Barbiturate      200     Benzodiazepine   200     Tricyclics       300     Opiates          300     Cocaine          300     THC              50  POCT PREGNANCY, URINE     Status: None   Collection Time    07/03/13 10:59 PM      Result Value Range   Preg Test, Ur NEGATIVE  NEGATIVE   Comment:            THE SENSITIVITY OF THIS     METHODOLOGY IS >24 mIU/mL   Psychological Evaluations:  Assessment:   DSM5:  Schizophrenia Disorders:   Obsessive-Compulsive Disorders:   Trauma-Stressor Disorders:  Posttraumatic Stress Disorder (309.81) Substance/Addictive Disorders:  Alcohol Withdrawal (291.81) and Opioid Disorder - Severe (304.00) Depressive Disorders:  Disruptive Mood Dysregulation Disorder (296.99)  AXIS I:  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 AXIS II:  Deferred AXIS III:   Past Medical History  Diagnosis Date  . Insomnia   . Anxiety   .  Anxiety    AXIS IV:  educational problems, occupational problems, problems related to legal system/crime and problems with primary support group AXIS V:  41-50 serious symptoms  Treatment Plan/Recommendations:   1. Admit for crisis management and stabilization. 2. Medication management to reduce current symptoms to base line and improve the patient's overall level of functioning. 3. Treat health problems as indicated. 4. Develop treatment plan to decrease risk of relapse upon discharge and to reduce the need for readmission. 5. Psycho-social education regarding relapse prevention and self care. 6. Health care follow up as needed for medical problems. 7. Restart home medications where appropriate. 8. Librium protocol for  benzo and alcohol withdrawal. 9. Clonidine protocol for opiate withdrawal. 10. Will restart her Welbutrin as written.  Treatment Plan Summary: Daily contact with patient to assess and evaluate symptoms and progress in treatment Medication management Current Medications:  Current Facility-Administered Medications  Medication Dose Route Frequency Provider Last Rate Last Dose  . acetaminophen (TYLENOL) tablet 650 mg  650 mg Oral Q6H PRN Evanna Janann August, NP      . alum & mag hydroxide-simeth (MAALOX/MYLANTA) 200-200-20 MG/5ML suspension 30 mL  30 mL Oral Q4H PRN Evanna Janann August, NP      . chlordiazePOXIDE (LIBRIUM) capsule 25 mg  25 mg Oral Q6H PRN Evanna Janann August, NP      . chlordiazePOXIDE (LIBRIUM) capsule 25 mg  25 mg Oral QID Audrea Muscat, NP   25 mg at 07/04/13 1155   Followed by  . [START ON 07/05/2013] chlordiazePOXIDE (LIBRIUM) capsule 25 mg  25 mg Oral TID Audrea Muscat, NP       Followed by  . [START ON 07/06/2013] chlordiazePOXIDE (LIBRIUM) capsule 25 mg  25 mg Oral BH-qamhs Evanna Janann August, NP       Followed by  . [START ON 07/08/2013] chlordiazePOXIDE (LIBRIUM) capsule 25 mg  25 mg Oral Daily Evanna Cori Merry Proud, NP      . hydrOXYzine (ATARAX/VISTARIL) tablet 50 mg  50 mg Oral TID PRN Audrea Muscat, NP      . loperamide (IMODIUM) capsule 2-4 mg  2-4 mg Oral PRN Evanna Janann August, NP      . magnesium hydroxide (MILK OF MAGNESIA) suspension 30 mL  30 mL Oral Daily PRN Evanna Janann August, NP      . multivitamin with minerals tablet 1 tablet  1 tablet Oral Daily Evanna Janann August, NP   1 tablet at 07/04/13 0749  . nicotine (NICODERM CQ - dosed in mg/24 hours) patch 21 mg  21 mg Transdermal Daily Nehemiah Settle, MD   21 mg at 07/04/13 0820  . ondansetron (ZOFRAN-ODT) disintegrating tablet 4 mg  4 mg Oral Q6H PRN Audrea Muscat, NP   4 mg at 07/04/13 1020  . thiamine (B-1) injection 100 mg  100 mg Intramuscular Once Evanna Janann August, NP      . Melene Muller ON 07/05/2013] thiamine (VITAMIN B-1) tablet 100 mg  100 mg Oral Daily Evanna Cori Burkett, NP      . zolpidem (AMBIEN) tablet 5 mg  5 mg Oral QHS PRN Evanna Janann August, NP        Observation Level/Precautions:  Detox  Laboratory:  reviewed  Psychotherapy:    Medications:  Clonidine, Librium, Welbutrin, Trazodone  Consultations:  If needed  Discharge Concerns:  Follow up care  Estimated LOS:  4-5 days  Other:     I certify that inpatient services furnished can reasonably be  expected to improve the patient's condition.   Rona Ravens. Mashburn RPAC 2:11 PM 07/04/2013  Patient is seen personally for psychiatric evaluation along with physician extender and completed suicide risk assessment and developed treatment plan. Reviewed the information documented and agree with the treatment plan.  Hayat Warbington,JANARDHAHA R. 07/04/2013 4:16 PM

## 2013-07-04 NOTE — Progress Notes (Addendum)
Nutrition Brief Note  Pt meets criteria for severe MALNUTRITION in the context of social/environmental circumstances as evidenced by <50-75% estimated energy intake for the past 4 months in addition to pt with visible severe muscle wasting and subcutaneous fat loss in clavicles, upper arms, and hands.  Patient identified on the Malnutrition Screening Tool (MST) Report.  Wt Readings from Last 10 Encounters:  07/04/13 99 lb (44.906 kg)  11/11/11 100 lb (45.36 kg)   Body mass index is 17.54 kg/(m^2). Patient meets criteria for underweight based on current BMI.   Discussed intake PTA with patient and compared to intake presently.  Pt reports since heroin use in May of this year after her mother passed away, food has not been a priority for her. Pt states when she was getting high food would make her nauseous and she would gradually eat less and less. Reports usual weight of 105-108 pounds, now at 99 pounds - states this weight loss occurred in the past 5 months. Pt also drinks 1/2 pint of liquor or drinks 2 beers every few days. Pt states she has been on Ensure in the past but felt like when she used to drink it that it would just sit on her stomach and make her nausea worse. Pt getting Zofran for nausea, however states Phenergan helps her better. Pt also c/o that she has not slept in 4 days and is requesting Ambien to help her sleep as she takes this for her insomnia at home. Pt also states sometimes her throat/stomach burns after eating and she thinks she may have acid reflux. Pt getting daily multivitamin and thiamine.  Pt getting daily multivitamin, thiamine, and PRN Zofran.   Current diet order is regular and pt is also offered choice of unit snacks mid-morning and mid-afternoon.  Pt is eating as desired.   Labs and medications reviewed.   Nutrition Dx:  Unintended wt change r/t suboptimal oral intake AEB pt report  Interventions:   Discussed the importance of nutrition and getting on a  regular meal schedule and encouraged intake of food and beverages as nausea improves.    Discussed pt's medications requests with PA   Supplements: Resource Breeze BID  No additional nutrition interventions warranted at this time. If nutrition issues arise, please consult RD.   Levon Hedger MS, RD, LDN [phone] Pager [phone] After Hours Pager

## 2013-07-04 NOTE — Progress Notes (Signed)
Patient ID: Rita Carey, female   DOB: [birth year], 32 y.o.   MRN: [id number] Toni Amend, a recently d/c'd pt and Zabella's girlfriend came to visit her this evening and was asked to leave the facility after being allowed onto the [address] [employer] [facility]. 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 Toni Amend appeared to be intoxicated but was cooperative with staff and left when asked to. Writer encouraged pt to consider recusing herself of this duty do to the obvious conflict of interest, especially if she plans to stay clean and sober after d/c. Pt agreed.

## 2013-07-04 NOTE — [facility] Suicide Risk Assessment (Signed)
[facility] INPATIENT: Family/Significant Other Suicide Prevention Education  Suicide Prevention Education:  Education Completed; No one has been identified by the patient as the family member/significant other with whom the patient will be residing, and identified as the person(s) who will aid the patient in the event of a mental health crisis (suicidal ideations/suicide attempt). With written consent from the patient, the family member/significant other has been provided the following suicide prevention education, prior to the and/or following the discharge of the patient.  The suicide prevention education provided includes the following:  Suicide risk factors  Suicide prevention and interventions  National Suicide Hotline telephone number  [facility] assessment telephone number  [facility] Emergency Assistance 911  [facility] and/or Residential Mobile Crisis Unit telephone number Request made of family/significant other to:  Remove weapons (e.g., guns, rifles, knives), all items previously/currently identified as safety concern.  Remove drugs/medications (over-the-counter, prescriptions, illicit drugs), all items previously/currently identified as a safety concern. The family member/significant other verbalizes understanding of the suicide prevention education information provided. The family member/significant other agrees to remove the items of safety concern listed above. Pt did not c/o SI at admission, nor have they endorsed SI during their stay here. SPE not required.  Reyes Ivan, LCSWA 07/04/2013  9:46 AM

## 2013-07-04 NOTE — [facility] Group Notes (Signed)
[facility] LCSW Group Therapy  07/04/2013 3:51 PM  Type of Therapy:  Group Therapy  Participation Level:  Did Not Attend   Rita Carey, Rita Carey 07/04/2013, 3:51 PM  

## 2013-07-04 NOTE — Progress Notes (Signed)
Adult Psychoeducational Group Note  Date:  07/04/2013 Time:  3:03 PM  Group Topic/Focus:  Stages of Change:   The focus of this group is to explain the stages of change and help patients identify changes they want to make upon discharge.  Participation Level:  Minimal  Participation Quality:  Attended even though she was not feeling well   Affect:  Anxious  Cognitive:  Oriented  Insight: Lacking  Engagement in Group:  Improving, Limited and Resistant  Modes of Intervention:  Discussion, Education, Socialization and Support  Additional Comments: Pt shared she is having a difficult time with her detox symptoms.  Reynolds Bowl 07/04/2013, 3:03 PM

## 2013-07-04 NOTE — Progress Notes (Signed)
Adult Psychoeducational Group Note  Date:  07/04/2013 Time:  9:00 AM  Group Topic/Focus:  Morning Wellness  Participation Level:  Minimal  Participation Quality:  Attentive  Affect:  Depressed and Flat  Cognitive:  Alert and Oriented  Insight: Limited  Engagement in Group:  Limited  Modes of Intervention:  Discussion  Additional Comments:  Pt. stated a goal for the day and shared with the group.  Harold Barban E 07/04/2013, 11:29 AM

## 2013-07-05 NOTE — Progress Notes (Signed)
[facility] MD Progress Note  07/05/2013 1:21 PM Rita Carey  MRN:  [id number] Subjective:  Patient reports she was able to rest last night. Continues to have chills and some aches and pains. She states the wellbutrin has not been helping her and feels none of the psychotropic medications work well for her except klonopin.  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 Insomnia   . Anxiety   . Anxiety    Axis IV: other psychosocial or environmental problems Axis V: 41-50 serious symptoms  ADL's:  Intact  Sleep: Fair  Appetite:  Fair  Suicidal Ideation:  denies Homicidal Ideation:  denies AEB (as evidenced by):  Psychiatric Specialty Exam: Review of Systems  Constitutional: Positive for malaise/fatigue.  HENT: Negative.   Eyes: Negative.   Respiratory: Negative.   Cardiovascular: Negative.   Gastrointestinal: Negative.   Genitourinary: Negative.   Musculoskeletal: Positive for myalgias.  Skin: Negative.   Neurological: Negative.   Endo/Heme/Allergies: Negative.   Psychiatric/Behavioral: Positive for substance abuse. The patient is nervous/anxious.     Blood pressure 111/64, pulse 78, temperature 97.4 F (36.3 C), temperature source Oral, resp. rate 12, height 5\' 3"  (1.6 m), weight 44.906 kg (99 lb), last menstrual period 07/03/2013.Body mass index is 17.54 kg/(m^2).  General Appearance: Casual  Eye Contact::  Fair  Speech:  Normal Rate  Volume:  Normal  Mood:  Anxious and Dysphoric  Affect:  Constricted and Flat  Thought Process:  Circumstantial   Orientation:  Full (Time, Place, and Person)  Thought Content:  Rumination  Suicidal Thoughts:  No  Homicidal Thoughts:  No  Memory:  Immediate;   Fair Recent;   Fair Remote;   Fair  Judgement:  Impaired  Insight:  Shallow  Psychomotor Activity:  Normal  Concentration:  Fair  Recall:  Fair  Akathisia:  No  Handed:  Right  AIMS (if indicated):     Assets:  Communication Skills Housing Social Support  Sleep:  Number of Hours: 5.25   Current Medications: Current Facility-Administered Medications  Medication Dose Route Frequency Provider Last Rate Last Dose  . acetaminophen (TYLENOL) tablet 650 mg  650 mg Oral Q6H PRN Audrea Muscat, NP   650 mg at 07/04/13 1704  . alum & mag hydroxide-simeth (MAALOX/MYLANTA) 200-200-20 MG/5ML suspension 30 mL  30 mL Oral Q4H PRN Evanna Janann August, NP      . chlordiazePOXIDE (LIBRIUM) capsule 25 mg  25 mg Oral Q6H PRN Evanna Janann August, NP      . chlordiazePOXIDE (LIBRIUM) capsule 25 mg  25 mg Oral QID Audrea Muscat, NP   25 mg at 07/05/13 4098   Followed by  . chlordiazePOXIDE (LIBRIUM) capsule 25 mg  25 mg Oral TID Audrea Muscat, NP       Followed by  . [START ON 07/06/2013] chlordiazePOXIDE (LIBRIUM) capsule 25 mg  25 mg Oral BH-qamhs Evanna Janann August, NP       Followed by  . [START ON 07/08/2013] chlordiazePOXIDE (LIBRIUM) capsule 25 mg  25 mg Oral Daily Evanna Cori Burkett, NP      . cloNIDine (CATAPRES) tablet 0.1 mg  0.1 mg Oral  QID Verne Spurr, PA-C   0.1 mg at 07/04/13 2204   Followed by  . [START ON 07/07/2013] cloNIDine (CATAPRES) tablet 0.1 mg  0.1 mg Oral BH-qamhs Verne Spurr, PA-C       Followed by  . [START ON 07/09/2013] cloNIDine (CATAPRES) tablet 0.1 mg  0.1 mg Oral QAC breakfast Verne Spurr, PA-C      . dicyclomine (BENTYL) tablet 20 mg  20 mg Oral Q6H PRN Verne Spurr, PA-C      . feeding supplement (RESOURCE BREEZE) liquid 1 Container  1 Container Oral BID BM Lavena Bullion, RD   1 Container at  07/05/13 1000  . magnesium hydroxide (MILK OF MAGNESIA) suspension 30 mL  30 mL Oral Daily PRN Evanna Janann August, NP      . methocarbamol (ROBAXIN) tablet 500 mg  500 mg Oral Q8H PRN Verne Spurr, PA-C      . multivitamin with minerals tablet 1 tablet  1 tablet Oral Daily Evanna Janann August, NP   1 tablet at 07/05/13 0816  . naproxen (NAPROSYN) tablet 500 mg  500 mg Oral BID PRN Verne Spurr, PA-C      . nicotine (NICODERM CQ - dosed in mg/24 hours) patch 21 mg  21 mg Transdermal Daily Nehemiah Settle, MD   21 mg at 07/05/13 0817  . ondansetron (ZOFRAN-ODT) disintegrating tablet 4 mg  4 mg Oral Q6H PRN Verne Spurr, PA-C      . thiamine (B-1) injection 100 mg  100 mg Intramuscular Once Evanna Janann August, NP      . thiamine (VITAMIN B-1) tablet 100 mg  100 mg Oral Daily Evanna Cori Merry Proud, NP   100 mg at 07/05/13 0816  . traZODone (DESYREL) tablet 50 mg  50 mg Oral QHS Verne Spurr, PA-C        Lab Results:  Results for orders placed during the hospital encounter of 07/03/13 (from the past 48 hour(s))  CBC     Status: None   Collection Time    07/03/13  8:35 PM      Result Value Range   WBC 5.0  4.0 - 10.5 K/uL   RBC 3.89  3.87 - 5.11 MIL/uL   Hemoglobin 12.6  12.0 - 15.0 g/dL   HCT 16.1  09.6 - 04.5 %   MCV 93.1  78.0 - 100.0 fL   MCH 32.4  26.0 - 34.0 pg   MCHC 34.8  30.0 - 36.0 g/dL   RDW 40.9  81.1 - 91.4 %   Platelets 254  150 - 400 K/uL  COMPREHENSIVE METABOLIC PANEL     Status: Abnormal   Collection Time    07/03/13  8:35 PM      Result Value Range   Sodium 138  135 - 145 mEq/L   Potassium 3.7  3.5 - 5.1 mEq/L   Chloride 101  96 - 112 mEq/L   CO2 26  19 - 32 mEq/L   Glucose, Bld 93  70 - 99 mg/dL   BUN 8  6 - 23 mg/dL   Creatinine, Ser 7.82  0.50 - 1.10 mg/dL   Calcium 9.2  8.4 - 95.6 mg/dL   Total Protein 7.0  6.0 - 8.3 g/dL   Albumin 3.8  3.5 - 5.2 g/dL   AST 20  0 - 37 U/L   ALT 8  0 - 35 U/L   Alkaline Phosphatase 70  39 - 117 U/L   Total  Bilirubin 0.2 (*) 0.3 -  1.2 mg/dL   GFR calc non Af Amer >90  >90 mL/min   GFR calc Af Amer >90  >90 mL/min   Comment: (NOTE)     The eGFR has been calculated using the CKD EPI equation.     This calculation has not been validated in all clinical situations.     eGFR's persistently <90 mL/min signify possible Chronic Kidney     Disease.  ETHANOL     Status: None   Collection Time    07/03/13  8:35 PM      Result Value Range   Alcohol, Ethyl (B) <11  0 - 11 mg/dL   Comment:            LOWEST DETECTABLE LIMIT FOR     SERUM ALCOHOL IS 11 mg/dL     FOR MEDICAL PURPOSES ONLY  URINE RAPID DRUG SCREEN (HOSP PERFORMED)     Status: Abnormal   Collection Time    07/03/13 10:52 PM      Result Value Range   Opiates POSITIVE (*) NONE DETECTED   Cocaine NONE DETECTED  NONE DETECTED   Benzodiazepines NONE DETECTED  NONE DETECTED   Amphetamines NONE DETECTED  NONE DETECTED   Tetrahydrocannabinol POSITIVE (*) NONE DETECTED   Barbiturates NONE DETECTED  NONE DETECTED   Comment:            DRUG SCREEN FOR MEDICAL PURPOSES     ONLY.  IF CONFIRMATION IS NEEDED     FOR ANY PURPOSE, NOTIFY LAB     WITHIN 5 DAYS.                LOWEST DETECTABLE LIMITS     FOR URINE DRUG SCREEN     Drug Class       Cutoff (ng/mL)     Amphetamine      1000     Barbiturate      200     Benzodiazepine   200     Tricyclics       300     Opiates          300     Cocaine          300     THC              50  POCT PREGNANCY, URINE     Status: None   Collection Time    07/03/13 10:59 PM      Result Value Range   Preg Test, Ur NEGATIVE  NEGATIVE   Comment:            THE SENSITIVITY OF THIS     METHODOLOGY IS >24 mIU/mL    Physical Findings: AIMS: Facial and Oral Movements Muscles of Facial Expression: None, normal Lips and Perioral Area: None, normal Jaw: None, normal Tongue: None, normal,Extremity Movements Upper (arms, wrists, hands, fingers): None, normal Lower (legs, knees, ankles, toes): None,  normal, Trunk Movements Neck, shoulders, hips: None, normal, Overall Severity Severity of abnormal movements (highest score from questions above): None, normal Incapacitation due to abnormal movements: None, normal Patient's awareness of abnormal movements (rate only patient's report): No Awareness, Dental Status Current problems with teeth and/or dentures?: No Does patient usually wear dentures?: No  CIWA:  CIWA-Ar Total: 1 COWS:  COWS Total Score: 2  Treatment Plan Summary: Daily contact with patient to assess and evaluate symptoms and progress in treatment Medication management  Plan: Patient detoxing without complications. Not interested in rehab or satrting  medication to address anxiety. If patient continues to be stable, consider discharge Sunday.  Medical Decision Making Problem Points:  Established problem, stable/improving (1) and Review of psycho-social stressors (1) Data Points:  Review of medication regiment & side effects (2)  I certify that inpatient services furnished can reasonably be expected to improve the patient's condition.   Ilyanna Baillargeon 07/05/2013, 1:21 PM

## 2013-07-05 NOTE — BHH Group Notes (Signed)
Adult Psychoeducational Group Note  Date:  07/05/2013 Time:  10:03 PM  Group Topic/Focus:  AA Meeting  Participation Level:  Active  Participation Quality:  Appropriate  Affect:  Appropriate  Cognitive:  Appropriate  Insight: Appropriate  Engagement in Group:  Engaged  Modes of Intervention:  Discussion and Education  Additional Comments:  Robby attended group.  Caroll Rancher A 07/05/2013, 10:03 PM

## 2013-07-05 NOTE — Progress Notes (Signed)
D patient states he slept well last nite, appetite is poor, energy level is low and ability to pay attention is good, c/o WD s/s of diarrhea, chills. Denies SI or HI and no AVH. Her goal today is to eat better, realign priorities, no drugs or alcohol, focus on work and positive leisure, go to NA and find a sponsor. Taking meds as ordered by MD, going to DR for meals, attending group and participating. A q54min safety checks continues and support offered R patient remains safe on the unit

## 2013-07-05 NOTE — Progress Notes (Signed)
Recreation Therapy Notes  Date: 09.25.2014 Time: 3:00pm Location: [address] Dayroom  Group Topic: Leisure Social worker) Addresses:  Patient will identify leisure areas of interest. Patient will identify   Behavioral Response: Engaged, Attentive, Appropraite  Intervention: Informational Survey  Activity: Leisure Skills Checklist. Patient was asked to complete a survey to identify how they view leisure, as well as leisure areas of interest (social, health and wellness, intellectual).   Education:  Leisure Education, Building control surveyor  Education Outcome: Acknowledges understanding  Clinical Observations/Feedback: Patient completed survey as requested and commented on the difference in the way she views leisure vs the book definition offered at the beginning of group session. Patient actively engaged in group discussion about the meaningful use of leisure, as well as offering suggestions and support to peers. Patient spoke about living in [place] for some years and missing the art scene there. Patient stated she has had difficulty finding same type of leisure interest here in [place]. Patient receptive to options provided by LRT.   Patient observed to shiver while wrapped in a blanket during group session. Patient additionally chewed on a straw to help her counteract withdrawal symptoms. LRT reported patient physical symptoms to RN following session.     [name] [name], LRT/CTRS  [name] L 07/05/2013 8:26 AM

## 2013-07-05 NOTE — BHH Group Notes (Signed)
[facility] LCSW Aftercare Discharge Planning Group Note   07/05/2013 8:45 AM  Participation Quality:  Alert and Appropriate   Mood/Affect:  Appropriate, Manic  Depression Rating:  1  Anxiety Rating:  1  Thoughts of Suicide:  Pt denies SI/HI  Will you contract for safety?   Yes  Current AVH:  Pt denies  Plan for Discharge/Comments:  Pt attended discharge planning group and actively participated in group.  CSW provided pt with today's workbook.  Pt presents manic today with pressured speech and flight of ideas.  Pt states that she is still interested in going to [facility] from here but wants to go home first.  Pt will follow up at [facility] the [place] for medication management and therapy.  Pt states that she is also open to trying NA meetings and a sponsor.  No further needs voiced by pt at this time.    Transportation Means: Pt reports access to transportation  Supports: No supports mentioned at this time  Reyes Ivan, LCSWA 07/05/2013 9:42 AM

## 2013-07-05 NOTE — Tx Team (Signed)
Interdisciplinary Treatment Plan Update (Adult)  Date: 07/05/2013  Time Reviewed:  9:45 AM  Progress in Treatment: Attending groups: Yes Participating in groups:  Yes Taking medication as prescribed:  Yes Tolerating medication:  Yes Family/Significant othe contact made: Yes, Patient understands diagnosis:  Yes Discussing patient identified problems/goals with staff:  Yes Medical problems stabilized or resolved:  Yes Denies suicidal/homicidal ideation: Yes Issues/concerns per patient self-inventory:  Yes Other:  New problem(s) identified: N/A  Discharge Plan or Barriers: CSW assessing for appropriate referrals.    Reason for Continuation of Hospitalization: Anxiety Depression Medication Stabilization  Comments: N/A  Estimated length of stay: 2-3 days  For review of initial/current patient goals, please see plan of care.  Attendees: Patient:     Family:     Physician:  Dr. Daleen Bo 07/05/2013 11:11 AM   Nursing:   Roswell Miners, RN 07/05/2013 11:11 AM   Clinical Social Worker:  Reyes Ivan, LCSWA 07/05/2013 11:11 AM   Other:  Burnetta Sabin, RN 07/05/2013 11:11 AM   Other:  Trula Slade, LCSWA 07/05/2013 11:11 AM   Other:  07/05/2013 11:11 AM   Other:     Other:    Other:    Other:    Other:    Other:    Other:     Scribe for Treatment Team:   Carmina Miller, 07/05/2013 , 11:11 AM

## 2013-07-05 NOTE — BHH Group Notes (Signed)
[facility] LCSW Group Therapy  07/05/2013 3:16 PM  Type of Therapy:  Group Therapy  Participation Level:  Did Not Attend  Smart, Herbert Seta 07/05/2013, 3:16 PM

## 2013-07-05 NOTE — Progress Notes (Deleted)
Recreation Therapy Notes  Date: 09.25.2014 Time: 3:00pm Location: [address] Dayroom  Group Topic: Leisure Education  Goal Area(s) Addresses:  Patient will identify leisure areas of interest. Patient will identify   Behavioral Response: Did not attend.   [name] Adeli Frost, LRT/CTRS  Jarquavious Fentress L 07/05/2013 8:23 AM

## 2013-07-05 NOTE — Progress Notes (Signed)
Adult Psychoeducational Group Note  Date:  07/05/2013 Time:  11:00 AM  Group Topic/Focus:  Recovery Goals:   The focus of this group is to identify appropriate goals for recovery and establish a plan to achieve them.  Participation Level:  Active  Participation Quality:  Appropriate, Sharing and Supportive  Affect:  Appropriate  Cognitive:  Appropriate  Insight: Appropriate  Engagement in Group:  Engaged  Modes of Intervention:  Activity and Discussion  Additional Comments:  The focus of the group was SMART goals in relation to relapse and recovery. The SMART acronym was broken down and explained in detail. Each pt received a [facility] journal and was encouraged to write their goals, feelings, and questions down.   Roanna Banning, [place] N 07/05/2013, 11:00 AM

## 2013-07-06 DIAGNOSIS — F1010 Alcohol abuse, uncomplicated: Secondary | ICD-10-CM

## 2013-07-06 DIAGNOSIS — F1120 Opioid dependence, uncomplicated: Secondary | ICD-10-CM

## 2013-07-06 DIAGNOSIS — F19239 Other psychoactive substance dependence with withdrawal, unspecified: Principal | ICD-10-CM

## 2013-07-06 DIAGNOSIS — F411 Generalized anxiety disorder: Secondary | ICD-10-CM

## 2013-07-06 MED ORDER — MIRTAZAPINE 15 MG PO TABS
15.0000 mg | ORAL_TABLET | Freq: Every day | ORAL | Status: DC
  Administered 2013-07-06: 15 mg via ORAL
  Filled 2013-07-06 (×4): qty 1

## 2013-07-06 MED ORDER — DIPHENHYDRAMINE HCL 25 MG PO CAPS
50.0000 mg | ORAL_CAPSULE | Freq: Every evening | ORAL | Status: DC | PRN
  Administered 2013-07-06: 50 mg via ORAL
  Filled 2013-07-06: qty 2

## 2013-07-06 NOTE — Progress Notes (Signed)
Patient ID: Rita Carey, female   DOB: 03-31-1981, 33 y.o.   MRN: [id number] D)  Has been quiet but anxious, tearful at times.  Attended group. Came to med window afterward for hs meds, requested trazodone be changed to [place] which was what she takes at home. States main issue with w/d is the anxiety.   A)  Will continue to monitor for safety, continue POC, NP ordered benadryl for sleep, pt became tearful, had wanted ambien, R)  Safety maintained.

## 2013-07-06 NOTE — ED Provider Notes (Signed)
Medical screening examination/treatment/procedure(s) were performed by non-physician practitioner and as supervising physician I was immediately available for consultation/collaboration.  Flint Melter, MD 07/06/13 [phone]

## 2013-07-06 NOTE — Progress Notes (Signed)
Psychoeducational Group Note  Date:  05/19/2012 Time: 1300  Group Topic/Focus:  Identifying Needs:   The focus of this group is to help patients identify their personal needs that have been historically problematic and identify healthy behaviors to address their needs.  Participation Level:  Active  Participation Quality: good  Affect: flat  Cognitive:  fair  Insight:  good  Engagement in Group: engaged  Additional Comments:   PDuke RN [employer]

## 2013-07-06 NOTE — Progress Notes (Addendum)
D Ryna has done a good job today...geting adjusted to her detox.Marland Kitchenattending her groups and learning the routine and her peers. SHe was given prn robaxin, for c/o leg discomfort, and she stated this " helped a lot". SHe attends her groups, is engaged in her recovery and is actively trying to develop healthier coping skills.   A SHe denies active SI within the past 24 hrs and stated her DC plan includes to " eat better and to abstyain from substances".\   R Safety is in place and poc moves forward.

## 2013-07-06 NOTE — Progress Notes (Signed)
[facility] MD Progress Note  07/06/2013 6:52 PM Rita Carey  MRN:  [id number] Subjective:  Rita Carey is sitting on her bed reading this evening after dinner. She continues to experience withdrawal symptoms, but denies any cravings. She endorses poor sleep. Her appetite is improving. She denies that she has any depression, and rates her depression as a 1 on a scale of 1-10 where 10 is the worst. She does endorse a significant amount of anxiety, and rates it as a 7 on a scale of 1-10. She denies any suicidal or homicidal ideation. She denies any auditory or visual hallucinations. Diagnosis:   DSM5:  Schizophrenia Disorders: none  Obsessive-Compulsive Disorders: denies  Trauma-Stressor Disorders: denies  Substance/Addictive Disorders: Alcohol Related Disorder - Moderate (303.90) and Opioid Disorder - Moderate (304.00)  Depressive Disorders: Disruptive Mood Dysregulation Disorder (296.99)  Axis I: Alcohol Abuse and Anxiety Disorder NOS, Opiate dependence in early withdrawal  Axis II: Deferred  Axis III:  Past Medical History   Diagnosis  Date   .  Insomnia    .  Anxiety    .  Anxiety    Axis IV: other psychosocial or environmental problems  Axis V: 41-50 serious symptoms  ADL's:  Intact  Sleep: Poor  Appetite:  Fair  Suicidal Ideation:  Patient denies any thought, plan, or intent Homicidal Ideation:  Patient denies any thought, plan, or intent AEB (as evidenced by):  Psychiatric Specialty Exam: Review of Systems  Constitutional: Negative.   HENT: Negative.   Eyes: Negative.   Respiratory: Negative.   Cardiovascular: Negative.   Gastrointestinal: Negative.   Genitourinary: Negative.   Musculoskeletal: Positive for myalgias and joint pain.  Skin: Negative.   Neurological: Negative.   Endo/Heme/Allergies: Negative.   Psychiatric/Behavioral: Positive for substance abuse. Negative for depression, suicidal ideas and hallucinations. The patient is nervous/anxious and has insomnia.      Blood pressure 92/62, pulse 90, temperature 97.6 F (36.4 C), temperature source Oral, resp. rate 16, height 5\' 3"  (1.6 m), weight 44.906 kg (99 lb), last menstrual period 07/03/2013.Body mass index is 17.54 kg/(m^2).  General Appearance: Casual  Eye Contact::  Fair  Speech:  Clear and Coherent  Volume:  Decreased  Mood:  Anxious and Dysphoric  Affect:  Congruent  Thought Process:  Linear  Orientation:  Full (Time, Place, and Person)  Thought Content:  WDL  Suicidal Thoughts:  No  Homicidal Thoughts:  No  Memory:  Immediate;   Fair Recent;   Fair Remote;   Fair  Judgement:  Fair  Insight:  Fair  Psychomotor Activity:  Normal  Concentration:  Good  Recall:  Good  Akathisia:  No  Handed:    AIMS (if indicated):     Assets:  Communication Skills Desire for Improvement  Sleep:  Number of Hours: 5.5   Current Medications: Current Facility-Administered Medications  Medication Dose Route Frequency Provider Last Rate Last Dose  . acetaminophen (TYLENOL) tablet 650 mg  650 mg Oral Q6H PRN Audrea Muscat, NP   650 mg at 07/04/13 1704  . alum & mag hydroxide-simeth (MAALOX/MYLANTA) 200-200-20 MG/5ML suspension 30 mL  30 mL Oral Q4H PRN Evanna Janann August, NP      . chlordiazePOXIDE (LIBRIUM) capsule 25 mg  25 mg Oral Q6H PRN Audrea Muscat, NP   25 mg at 07/05/13 2139  . chlordiazePOXIDE (LIBRIUM) capsule 25 mg  25 mg Oral BH-qamhs Evanna Janann August, NP       Followed by  . [START ON 07/08/2013] chlordiazePOXIDE (  LIBRIUM) capsule 25 mg  25 mg Oral Daily Evanna Janann August, NP      . cloNIDine (CATAPRES) tablet 0.1 mg  0.1 mg Oral QID Verne Spurr, PA-C   0.1 mg at 07/06/13 1737   Followed by  . [START ON 07/07/2013] cloNIDine (CATAPRES) tablet 0.1 mg  0.1 mg Oral BH-qamhs Verne Spurr, PA-C       Followed by  . [START ON 07/09/2013] cloNIDine (CATAPRES) tablet 0.1 mg  0.1 mg Oral QAC breakfast Verne Spurr, PA-C      . dicyclomine (BENTYL) tablet 20 mg  20 mg Oral Q6H  PRN Verne Spurr, PA-C      . diphenhydrAMINE (BENADRYL) capsule 50 mg  50 mg Oral QHS PRN Verne Spurr, PA-C   50 mg at 07/06/13 0027  . feeding supplement (RESOURCE BREEZE) liquid 1 Container  1 Container Oral BID BM Lavena Bullion, RD   1 Container at 07/06/13 [id number]  . magnesium hydroxide (MILK OF MAGNESIA) suspension 30 mL  30 mL Oral Daily PRN Evanna Janann August, NP      . methocarbamol (ROBAXIN) tablet 500 mg  500 mg Oral Q8H PRN Verne Spurr, PA-C   500 mg at 07/06/13 1215  . mirtazapine (REMERON) tablet 15 mg  15 mg Oral QHS Jorje Guild, PA-C      . multivitamin with minerals tablet 1 tablet  1 tablet Oral Daily Evanna Janann August, NP   1 tablet at 07/06/13 0810  . naproxen (NAPROSYN) tablet 500 mg  500 mg Oral BID PRN Verne Spurr, PA-C      . nicotine (NICODERM CQ - dosed in mg/24 hours) patch 21 mg  21 mg Transdermal Daily Nehemiah Settle, MD   21 mg at 07/06/13 0811  . ondansetron (ZOFRAN-ODT) disintegrating tablet 4 mg  4 mg Oral Q6H PRN Verne Spurr, PA-C      . thiamine (B-1) injection 100 mg  100 mg Intramuscular Once Evanna Cori Burkett, NP      . thiamine (VITAMIN B-1) tablet 100 mg  100 mg Oral Daily Evanna Janann August, NP   100 mg at 07/06/13 9604    Lab Results: No results found for this or any previous visit (from the past 48 hour(s)).  Physical Findings: AIMS: Facial and Oral Movements Muscles of Facial Expression: None, normal Lips and Perioral Area: None, normal Jaw: None, normal Tongue: None, normal,Extremity Movements Upper (arms, wrists, hands, fingers): None, normal Lower (legs, knees, ankles, toes): None, normal, Trunk Movements Neck, shoulders, hips: None, normal, Overall Severity Severity of abnormal movements (highest score from questions above): None, normal Incapacitation due to abnormal movements: None, normal Patient's awareness of abnormal movements (rate only patient's report): No Awareness, Dental Status Current problems with teeth  and/or dentures?: No Does patient usually wear dentures?: No  CIWA:  CIWA-Ar Total: 5 COWS:  COWS Total Score: 2  Treatment Plan Summary: Daily contact with patient to assess and evaluate symptoms and progress in treatment Medication management  Plan: Observation Level/Precautions:  Detox  Laboratory:  Reviewed  Psychotherapy:   attend groups  Medications:   Continue detox protocol. Try Remeron 15 mg at bedtime for sleep.  Consultations:  None  Discharge Concerns:   high risk for relapse  Estimated LOS: 5-7 days  Other:      Medical Decision Making Problem Points:  Established problem, stable/improving (1) and Review of psycho-social stressors (1) Data Points:  Review or order clinical lab tests (1) Review of new medications or change in  dosage (2)  I certify that inpatient services furnished can reasonably be expected to improve the patient's condition.   WATT,ALAN 07/06/2013, 6:52 PM  Discussed with provider. Reviewed note. Agree with above findings, assessment and plan.   Jacqulyn Cane, M.D.  07/06/2013 11:16 PM
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
## 2013-07-06 NOTE — [facility] Group Notes (Signed)
[facility] Group Notes:  (Nursing/MHT/Case Management/Adjunct)  Date:  07/06/2013  Time:  3:26 PM  Type of Therapy:  Psychoeducational Skills  Participation Level:  Active  Participation Quality:  Appropriate  Affect:  Appropriate  Cognitive:  Appropriate  Insight:  Appropriate  Engagement in Group:  Engaged  Modes of Intervention:  Problem-solving  Summary of Progress/Problems: Pt attended healthy coping skills group, and was able to engage in treatment.   Corry Storie Shanta 07/06/2013, 3:26 PM 

## 2013-07-07 DIAGNOSIS — F988 Other specified behavioral and emotional disorders with onset usually occurring in childhood and adolescence: Secondary | ICD-10-CM

## 2013-07-07 MED ORDER — GABAPENTIN 300 MG PO CAPS
ORAL_CAPSULE | ORAL | Status: AC
  Filled 2013-07-07: qty 1

## 2013-07-07 MED ORDER — ATOMOXETINE HCL 25 MG PO CAPS
25.0000 mg | ORAL_CAPSULE | Freq: Every day | ORAL | Status: DC
  Administered 2013-07-07 – 2013-07-09 (×3): 25 mg via ORAL
  Filled 2013-07-07 (×2): qty 1
  Filled 2013-07-07: qty 28
  Filled 2013-07-07 (×2): qty 1

## 2013-07-07 MED ORDER — GABAPENTIN 300 MG PO CAPS
300.0000 mg | ORAL_CAPSULE | Freq: Three times a day (TID) | ORAL | Status: DC
  Administered 2013-07-07 – 2013-07-10 (×9): 300 mg via ORAL
  Filled 2013-07-07 (×7): qty 1
  Filled 2013-07-07: qty 42
  Filled 2013-07-07: qty 1
  Filled 2013-07-07: qty 42
  Filled 2013-07-07: qty 1
  Filled 2013-07-07: qty 42
  Filled 2013-07-07: qty 1

## 2013-07-07 MED ORDER — TRAZODONE HCL 50 MG PO TABS
50.0000 mg | ORAL_TABLET | Freq: Every evening | ORAL | Status: DC | PRN
  Administered 2013-07-07 – 2013-07-09 (×4): 50 mg via ORAL
  Filled 2013-07-07 (×5): qty 1
  Filled 2013-07-07: qty 28
  Filled 2013-07-07 (×3): qty 1
  Filled 2013-07-07: qty 28

## 2013-07-07 NOTE — [facility] Group Notes (Signed)
[facility] Group Notes: (Clinical Social Work)   07/07/2013      Type of Therapy:  Group Therapy   Participation Level:  Did Not Attend    Ambrose Mantle, LCSW 07/07/2013, 12:38 PM

## 2013-07-07 NOTE — Progress Notes (Signed)
[facility] MD Progress Note  07/07/2013 12:02 PM Rita Carey  MRN:  [id number] Subjective:  Rita Carey reports that she did not sleep well, and feels that the Remeron gave her a headache. We reviewed multiple other medications and she does not want to try Vistaril, Seroquel, amitriptyline, or Risperdal. She is willing to do trazodone. She continues to express significant withdrawal symptoms including stomach cramps, muscle aches, joint pain, and chills. She denies any cravings for opioids or other substances. She endorses a significant amount of anxiety, and rates her anxiety as a 5 on a scale of 1-10 where 10 is the worst. She denies that she has any depression. She denies any suicidal or homicidal ideation. She denies any auditory or visual hallucinations. Her appetite is good. She asked to be screened for ADHD, and reports that she is easily distracted, has difficulty focusing, his forgetful, loses items, procrastinates, and has trouble finishing projects.  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  Insomnia    .  Anxiety    .  Anxiety    Axis IV: other psychosocial or environmental problems  Axis V: 41-50 serious symptoms  ADL's:  Intact  Sleep: Poor  Appetite:  Good  Suicidal Ideation:  Patient denies thoughts, plan, or intent Homicidal Ideation:  Patient denies thoughts, plan, or intent AEB (as evidenced by):  Psychiatric Specialty Exam: Review of Systems  Constitutional: Positive for chills and malaise/fatigue.  Eyes: Negative.    Cardiovascular: Negative.   Gastrointestinal: Positive for abdominal pain.  Genitourinary: Negative.   Musculoskeletal: Positive for myalgias and joint pain.  Neurological: Positive for headaches.  Endo/Heme/Allergies: Negative.   Psychiatric/Behavioral: Positive for substance abuse. Negative for depression, suicidal ideas and hallucinations. The patient is nervous/anxious and has insomnia.     Blood pressure 108/70, pulse 63, temperature 97.5 F (36.4 C), temperature source Oral, resp. rate 19, height 5\' 3"  (1.6 m), weight 44.906 kg (99 lb), last menstrual period 07/03/2013.Body mass index is 17.54 kg/(m^2).  General Appearance: Disheveled  Eye Solicitor::  Fair  Speech:  Clear and Coherent  Volume:  Decreased  Mood:  Anxious and Dysphoric  Affect:  Congruent  Thought Process:  Goal Directed  Orientation:  Full (Time, Place, and Person)  Thought Content:  WDL  Suicidal Thoughts:  No  Homicidal Thoughts:  No  Memory:  Immediate;   Fair Recent;   Fair Remote;   Fair  Judgement:  Impaired  Insight:  Lacking  Psychomotor Activity:  Normal  Concentration:  Fair  Recall:  Fair  Akathisia:  No  Handed:    AIMS (if indicated):     Assets:  Communication Skills Desire for Improvement  Sleep:  Number of Hours: 5.5   Current Medications: Current Facility-Administered Medications  Medication Dose Route Frequency Provider Last Rate Last Dose  . acetaminophen (TYLENOL) tablet 650 mg  650 mg Oral Q6H PRN Audrea Muscat, NP   650 mg at 07/04/13 1704  . alum & mag hydroxide-simeth (MAALOX/MYLANTA) 200-200-20 MG/5ML suspension 30 mL  30 mL Oral Q4H PRN Evanna Janann August, NP      . atomoxetine (  STRATTERA) capsule 25 mg  25 mg Oral QHS Jorje Guild, PA-C      . [START ON 07/08/2013] chlordiazePOXIDE (LIBRIUM) capsule 25 mg  25 mg Oral Daily Evanna Cori Merry Proud, NP      . cloNIDine (CATAPRES) tablet 0.1 mg  0.1 mg Oral BH-qamhs Neil Mashburn, PA-C   0.1 mg at 07/07/13 0745   Followed by   . [START ON 07/09/2013] cloNIDine (CATAPRES) tablet 0.1 mg  0.1 mg Oral QAC breakfast Verne Spurr, PA-C      . dicyclomine (BENTYL) tablet 20 mg  20 mg Oral Q6H PRN Verne Spurr, PA-C      . diphenhydrAMINE (BENADRYL) capsule 50 mg  50 mg Oral QHS PRN Verne Spurr, PA-C   50 mg at 07/06/13 0027  . feeding supplement (RESOURCE BREEZE) liquid 1 Container  1 Container Oral BID BM Lavena Bullion, RD   1 Container at 07/07/13 0745  . magnesium hydroxide (MILK OF MAGNESIA) suspension 30 mL  30 mL Oral Daily PRN Evanna Janann August, NP      . methocarbamol (ROBAXIN) tablet 500 mg  500 mg Oral Q8H PRN Verne Spurr, PA-C   500 mg at 07/07/13 0745  . multivitamin with minerals tablet 1 tablet  1 tablet Oral Daily Evanna Janann August, NP   1 tablet at 07/07/13 0744  . naproxen (NAPROSYN) tablet 500 mg  500 mg Oral BID PRN Verne Spurr, PA-C      . nicotine (NICODERM CQ - dosed in mg/24 hours) patch 21 mg  21 mg Transdermal Daily Nehemiah Settle, MD   21 mg at 07/07/13 0744  . ondansetron (ZOFRAN-ODT) disintegrating tablet 4 mg  4 mg Oral Q6H PRN Verne Spurr, PA-C      . thiamine (B-1) injection 100 mg  100 mg Intramuscular Once Evanna Janann August, NP      . thiamine (VITAMIN B-1) tablet 100 mg  100 mg Oral Daily Evanna Cori Merry Proud, NP   100 mg at 07/07/13 0745  . traZODone (DESYREL) tablet 50 mg  50 mg Oral QHS,MR X 1 Jorje Guild, PA-C        Lab Results: No results found for this or any previous visit (from the past 48 hour(s)).  Physical Findings: AIMS: Facial and Oral Movements Muscles of Facial Expression: None, normal Lips and Perioral Area: None, normal Jaw: None, normal Tongue: None, normal,Extremity Movements Upper (arms, wrists, hands, fingers): None, normal Lower (legs, knees, ankles, toes): None, normal, Trunk Movements Neck, shoulders, hips: None, normal, Overall Severity Severity of abnormal movements (highest score from questions above): None, normal Incapacitation  due to abnormal movements: None, normal Patient's awareness of abnormal movements (rate only patient's report): No Awareness, Dental Status Current problems with teeth and/or dentures?: No Does patient usually wear dentures?: No  CIWA:  CIWA-Ar Total: 3 COWS:  COWS Total Score: 2  Treatment Plan Summary: Daily contact with patient to assess and evaluate symptoms and progress in treatment Medication management Research options for followup care  Plan: We will discontinue the Remeron and try her on trazodone 50 mg at bedtime with a repeat if necessary. We will also initiate Strattera 25 mg at bedtime to target her and attentive type ADHD. Otherwise we will continue her safe medical detox. Medical Decision Making Problem Points:  Established problem, stable/improving (1) and Review of psycho-social stressors (1) Data Points:  Review or order clinical lab tests (1) Review of medication regiment & side effects (2) Review of new medications or change in  dosage (2)  I certify that inpatient services furnished can reasonably be expected to improve the patient's condition.   WATT,ALAN 07/07/2013, 12:02 PM  Discussed with provider. Reviewed note. Agree with above findings, assessment and plan.   Jacqulyn Cane, M.D.  07/07/2013 11:38 PM

## 2013-07-07 NOTE — Progress Notes (Signed)
Pt continues to report that she is having severe withdrawal symptoms, and that the neurontin did not work. Jorje Guild PA made aware of situation, no new orders noted.

## 2013-07-07 NOTE — Progress Notes (Signed)
Patient ID: Rita Carey, female   DOB: 06-05-1981, 32 y.o.   MRN: [id number] D)   Stays in her room frequently to stay was, she reports, and wears a big hoodie as well as a blanket when she comes out of her room.  States she is still chilling at times, has trouble keeping warm.  Was given heat packs for pain and aching in her rt leg, which seemed to be helping.  Is interacting more with peers on the hall, pleasant, cooperative, compliant. A)  Will continue to monitor for safety, Continue POC R)  Safety maintained.

## 2013-07-07 NOTE — Progress Notes (Signed)
[name] has had a difficult day today, staying emotionally regulated and in control of her behaviors and her emotions, simultaneously. She is seen crying and sitting on the floor, outside the 300 hall med room, crying and shaking, sobbing " I just need some medicine"   [name] attends group and takes meds as scheduled. She completed her AM self inventory and on it she denied SI within the past 24 hrs, she rated her derpession and hopelessness "1/1" and stated  Her DC plan is to " eat better, abstain from alcohol and drugs and maintain her meds".   R Safety is in place and poc moves forward.

## 2013-07-08 DIAGNOSIS — F1193 Opioid use, unspecified with withdrawal: Secondary | ICD-10-CM | POA: Diagnosis present

## 2013-07-08 DIAGNOSIS — F410 Panic disorder [episodic paroxysmal anxiety] without agoraphobia: Secondary | ICD-10-CM | POA: Diagnosis present

## 2013-07-08 DIAGNOSIS — F1920 Other psychoactive substance dependence, uncomplicated: Secondary | ICD-10-CM | POA: Diagnosis present

## 2013-07-08 DIAGNOSIS — F1994 Other psychoactive substance use, unspecified with psychoactive substance-induced mood disorder: Secondary | ICD-10-CM | POA: Diagnosis present

## 2013-07-08 DIAGNOSIS — F1120 Opioid dependence, uncomplicated: Secondary | ICD-10-CM | POA: Diagnosis present

## 2013-07-08 DIAGNOSIS — F39 Unspecified mood [affective] disorder: Secondary | ICD-10-CM | POA: Diagnosis present

## 2013-07-08 MED ORDER — CHLORDIAZEPOXIDE HCL 25 MG PO CAPS
25.0000 mg | ORAL_CAPSULE | Freq: Once | ORAL | Status: AC
  Administered 2013-07-08: 25 mg via ORAL
  Filled 2013-07-08: qty 1

## 2013-07-08 MED ORDER — ZOLPIDEM TARTRATE 10 MG PO TABS: 5.0000 mg | ORAL_TABLET | Freq: Every evening | ORAL | Status: DC | PRN

## 2013-07-08 MED ORDER — ZOLPIDEM TARTRATE 5 MG PO TABS
5.0000 mg | ORAL_TABLET | Freq: Every evening | ORAL | Status: DC | PRN
  Administered 2013-07-08: 5 mg via ORAL
  Filled 2013-07-08: qty 1

## 2013-07-08 NOTE — Progress Notes (Signed)
D:Pt reports that she plans to eat better, improve finances and stay away from drugs following discharge. Pt has mild anxiety. She is interacting with staff and peers on the unit.   A:Offered support, encouragement and 15 minute checks. R:Pt denies si and hi. Safety maintained on the unit.

## 2013-07-08 NOTE — Tx Team (Signed)
Interdisciplinary Treatment Plan Update (Adult)  Date: 07/08/2013  Time Reviewed:  9:45 AM  Progress in Treatment: Attending groups: Yes Participating in groups:  Yes Taking medication as prescribed:  Yes Tolerating medication:  Yes Family/Significant othe contact made: N/A Patient understands diagnosis:  Yes Discussing patient identified problems/goals with staff:  Yes Medical problems stabilized or resolved:  Yes Denies suicidal/homicidal ideation: Yes Issues/concerns per patient self-inventory:  Yes Other:  New problem(s) identified: N/A  Discharge Plan or Barriers: Pt will follow up at [facility] of the [place] for medication management and therapy.  Referral made to [facility] for further treatment but pt doesn't want to go now.    Reason for Continuation of Hospitalization: Anxiety Depression Detox Medication Stabilization  Comments: N/A  Estimated length of stay: 1-2 days  For review of initial/current patient goals, please see plan of care.  Attendees: Patient:     Family:     Physician:  Dr. Dub Mikes 07/08/2013 10:46 AM   Nursing:   Dellia Cloud, RN 07/08/2013 10:46 AM   Clinical Social Worker:  Reyes Ivan, LCSWA 07/08/2013 10:46 AM   Other: Onnie Boer, RN case manager 07/08/2013 10:46 AM   Other:  Trula Slade, LCSWA 07/08/2013 10:46 AM   Other:  Serena Colonel, Np 07/08/2013 10:46 AM   Other:     Other:    Other:    Other:    Other:    Other:    Other:     Scribe for Treatment Team:   Carmina Miller, 07/08/2013 , 10:46 AM

## 2013-07-08 NOTE — [facility] Group Notes (Signed)
[facility] LCSW Aftercare Discharge Planning Group Note   07/08/2013 8:45 AM  Participation Quality:  Alert and Appropriate   Mood/Affect:  Appropriate, Anxious  Depression Rating:  0  Anxiety Rating:  8-9  Thoughts of Suicide:  Pt denies SI/HI  Will you contract for safety?   Yes  Current AVH:  Pt denies  Plan for Discharge/Comments:  Pt attended discharge planning group and actively participated in group.  CSW provided pt with today's workbook.  Pt reports having panic attacks yesterday and still high anxiety.  Referral was made to [facility] but pt states she no longer interested in going there due to missing too much work.  Pt will follow up at [facility] of the [facility] for medication management and therapy.  Pt states that she is also open to trying NA meetings and a sponsor.  No further needs voiced by pt at this time.    Transportation Means: Pt reports access to transportation  Supports: No supports mentioned at this time  Reyes Ivan, LCSWA 07/08/2013 9:50 AM

## 2013-07-08 NOTE — Progress Notes (Addendum)
Patient ID: Rita Carey, female   DOB: 1980-12-19, 32 y.o.   MRN: [id number] D: pt. Reports anxiety at "8" of 10. "withdrawal symptoms have tapered off quite a bit" A: Writer introduced self to client and encourage her to also use coping skills i.e. Deep breathing for symptoms of anxiety. Writer encouraged group. Staff will monitor q87min for safety. R: pt. Is safe on the unit. Pt. Attended group.

## 2013-07-08 NOTE — Progress Notes (Signed)
[facility] MD Progress Note  07/08/2013 3:51 PM Rita Carey  MRN:  [id number] Subjective:  Endorses persistent severe anxiety. States that she had a panic attack and that it was very embarrassing to have one in front of the other patients. She claims she has not have one in years as she was taking the Klonopin. Claims is the only medication that helps her. She used to take Klonopin 2 mg TID and it has been decreased to 1 mg BID. She claims that she has not been able to have follow up appointments and she has ran out of it. She admits she will have to buy off the streets. She states she is under a lot of stress. Still grieving the death of her mother. She is having a hard time dealing with her girlfriend active use of alcohol and her behavior when intoxicated. She is employed only part time and she has financial difficulties. She claims to have used multiple other medications but none of them help with the panic. She is concerned of the panic getting worst if she was not able to have klonopin available to her Diagnosis:   DSM5: Schizophrenia Disorders:   Obsessive-Compulsive Disorders:   Trauma-Stressor Disorders:   Substance/Addictive Disorders:  Opioid Disorder - Severe (304.00) Depressive Disorders:  Major Depressive Disorder - Severe (296.23)  Axis I: Panic Disorder  ADL's:  Intact  Sleep: Poor  Appetite:  Poor  Suicidal Ideation:  Plan:  denies Intent:  denies Means:  denies Homicidal Ideation:  Plan:  denies Intent:  denies Means:  denies AEB (as evidenced by):  Psychiatric Specialty Exam: Review of Systems  Constitutional: Negative.   HENT: Negative.   Eyes: Negative.   Respiratory: Negative.   Cardiovascular: Negative.   Gastrointestinal: Negative.   Genitourinary: Negative.   Musculoskeletal: Negative.   Skin: Negative.   Neurological: Negative.   Endo/Heme/Allergies: Negative.   Psychiatric/Behavioral: Positive for depression and substance abuse. The patient is  nervous/anxious and has insomnia.     Blood pressure 118/86, pulse 80, temperature 97.5 F (36.4 C), temperature source Oral, resp. rate 16, height 5\' 3"  (1.6 m), weight 44.906 kg (99 lb), last menstrual period 07/03/2013.Body mass index is 17.54 kg/(m^2).  General Appearance: Disheveled  Eye Solicitor::  Fair  Speech:  Clear and Coherent  Volume:  fluctuated  Mood:  Anxious and Irritable  Affect:  anxious, worried  Thought Process:  Coherent and Goal Directed  Orientation:  Full (Time, Place, and Person)  Thought Content:  symptoms, worried about not having the klonopin and experiencing persistence of the panic attacks  Suicidal Thoughts:  No  Homicidal Thoughts:  No  Memory:  Immediate;   Fair Recent;   Fair Remote;   Fair  Judgement:  Fair  Insight:  Present and superficial  Psychomotor Activity:  Restlessness  Concentration:  Fair  Recall:  Fair  Akathisia:  No  Handed:    AIMS (if indicated):     Assets:  Desire for Improvement Housing  Sleep:  Number of Hours: 6.5   Current Medications: Current Facility-Administered Medications  Medication Dose Route Frequency Provider Last Rate Last Dose  . acetaminophen (TYLENOL) tablet 650 mg  650 mg Oral Q6H PRN Audrea Muscat, NP   650 mg at 07/04/13 1704  . alum & mag hydroxide-simeth (MAALOX/MYLANTA) 200-200-20 MG/5ML suspension 30 mL  30 mL Oral Q4H PRN Evanna Janann August, NP      . atomoxetine (STRATTERA) capsule 25 mg  25 mg Oral QHS Jorje Guild,  PA-C   25 mg at 07/07/13 2148  . cloNIDine (CATAPRES) tablet 0.1 mg  0.1 mg Oral BH-qamhs Neil Mashburn, PA-C   0.1 mg at 07/08/13 4540   Followed by  . [START ON 07/09/2013] cloNIDine (CATAPRES) tablet 0.1 mg  0.1 mg Oral QAC breakfast Verne Spurr, PA-C      . dicyclomine (BENTYL) tablet 20 mg  20 mg Oral Q6H PRN Verne Spurr, PA-C      . diphenhydrAMINE (BENADRYL) capsule 50 mg  50 mg Oral QHS PRN Verne Spurr, PA-C   50 mg at 07/06/13 0027  . feeding supplement (RESOURCE  BREEZE) liquid 1 Container  1 Container Oral BID BM Lavena Bullion, RD   1 Container at 07/08/13 1427  . gabapentin (NEURONTIN) capsule 300 mg  300 mg Oral TID Jorje Guild, PA-C   300 mg at 07/08/13 1157  . magnesium hydroxide (MILK OF MAGNESIA) suspension 30 mL  30 mL Oral Daily PRN Evanna Janann August, NP      . methocarbamol (ROBAXIN) tablet 500 mg  500 mg Oral Q8H PRN Verne Spurr, PA-C   500 mg at 07/07/13 1818  . multivitamin with minerals tablet 1 tablet  1 tablet Oral Daily Evanna Janann August, NP   1 tablet at 07/08/13 9811  . naproxen (NAPROSYN) tablet 500 mg  500 mg Oral BID PRN Verne Spurr, PA-C   500 mg at 07/07/13 2015  . nicotine (NICODERM CQ - dosed in mg/24 hours) patch 21 mg  21 mg Transdermal Daily Nehemiah Settle, MD   21 mg at 07/08/13 0824  . ondansetron (ZOFRAN-ODT) disintegrating tablet 4 mg  4 mg Oral Q6H PRN Verne Spurr, PA-C      . thiamine (B-1) injection 100 mg  100 mg Intramuscular Once Evanna Janann August, NP      . thiamine (VITAMIN B-1) tablet 100 mg  100 mg Oral Daily Evanna Cori Merry Proud, NP   100 mg at 07/08/13 9147  . traZODone (DESYREL) tablet 50 mg  50 mg Oral QHS,MR X 1 Jorje Guild, PA-C   50 mg at 07/07/13 2152  . zolpidem (AMBIEN) tablet 5 mg  5 mg Oral QHS PRN Nehemiah Settle, MD        Lab Results: No results found for this or any previous visit (from the past 48 hour(s)).  Physical Findings: AIMS: Facial and Oral Movements Muscles of Facial Expression: None, normal Lips and Perioral Area: None, normal Jaw: None, normal Tongue: None, normal,Extremity Movements Upper (arms, wrists, hands, fingers): None, normal Lower (legs, knees, ankles, toes): None, normal, Trunk Movements Neck, shoulders, hips: None, normal, Overall Severity Severity of abnormal movements (highest score from questions above): None, normal Incapacitation due to abnormal movements: None, normal Patient's awareness of abnormal movements (rate only patient's  report): No Awareness, Dental Status Current problems with teeth and/or dentures?: No Does patient usually wear dentures?: No  CIWA:  CIWA-Ar Total: 1 COWS:  COWS Total Score: 2  Treatment Plan Summary: Daily contact with patient to assess and evaluate symptoms and progress in treatment Medication management  Plan: Supportive approach/coping skills/relapse prevention           Extend the Librium for 24 hours           Optimize response to the psychotropics (non benzodiazepines) to help with her anxiety disorder           Not sleeping well,poor response to Trazodone, Remeron           Will  try Ambien what she claims works for her           Complete the Opioid Detox protocol Medical Decision Making Problem Points:  Review of psycho-social stressors (1) Data Points:  Review of medication regiment & side effects (2) Review of new medications or change in dosage (2)  I certify that inpatient services furnished can reasonably be expected to improve the patient's condition.   Rhyder Bratz A 07/08/2013, 3:51 PM

## 2013-07-08 NOTE — [facility] Group Notes (Signed)
[facility] LCSW Group Therapy  07/08/2013  1:15 PM   Type of Therapy:  Group Therapy  Participation Level:  Active  Participation Quality:  Appropriate and Attentive  Affect:  Appropriate  Cognitive:  Alert and Appropriate  Insight:  Developing/Improving and Engaged  Engagement in Therapy:  Developing/Improving and Engaged  Modes of Intervention:  Clarification, Confrontation, Discussion, Education, Exploration, Limit-setting, Orientation, Problem-solving, Rapport Building, Dance movement psychotherapist, Socialization and Support  Summary of Progress/Problems: Pt identified obstacles faced currently and processed barriers involved in overcoming these obstacles. Pt identified steps necessary for overcoming these obstacles and explored motivation (internal and external) for facing these difficulties head on. Pt further identified one area of concern in their lives and chose a goal to focus on for today.  Pt shared that her biggest obstacle was using drugs and overcoming that.  Pt processed the loss of her ambition and how drugs impacted her life negatively.  Pt states that she plans to get a sponsor to hold her accountable and stay away from negative people that would influence her to use.  Pt actively participated and was engaged in group discussion.  Rita Carey, [place] 07/08/2013 2:39 PM

## 2013-07-08 NOTE — Progress Notes (Signed)
Patient ID: Rita Carey, female   DOB: 04/09/1981, 32 y.o.   MRN: [id number] D)  Has been upset at times tonight after med changes today.  States still feeling anxious, wanted librium at hs, tearful at times.  Didn't want to go to group this evening, stated AA group doesn't cover the issues that affect NA.  Compliant with meds available, did attend group but was late.  A)  Will continue to monitor for safety, continue POC R)  safety maintained.

## 2013-07-09 DIAGNOSIS — F1994 Other psychoactive substance use, unspecified with psychoactive substance-induced mood disorder: Secondary | ICD-10-CM

## 2013-07-09 MED ORDER — CLONAZEPAM 1 MG PO TABS: 1.0000 mg | ORAL_TABLET | Freq: Two times a day (BID) | ORAL | Status: DC

## 2013-07-09 MED ORDER — CLONAZEPAM 1 MG PO TABS
1.0000 mg | ORAL_TABLET | Freq: Two times a day (BID) | ORAL | Status: DC
  Administered 2013-07-09 – 2013-07-10 (×3): 1 mg via ORAL
  Filled 2013-07-09 (×3): qty 1

## 2013-07-09 NOTE — Progress Notes (Signed)
D: Patient denies SI/HI and A/V hallucinations; patient reports sleep is poor; reports appetite is good ; reports energy level is normal ; reports ability to pay attention is improving; rates depression as 1/10;  Patient reporting anxiety and is shaking and becomes tearful and physician made aware  A: Monitored q 15 minutes; patient encouraged to attend groups; patient educated about medications; patient given medications per physician orders; patient encouraged to express feelings and/or concerns  R: Patient is reports anxiety but when observed in the dayroom the patient is sitting quietly watching television during free time and during group the patient is able to focus and talk about the medications not working; patient's interaction with staff and peers is appropriate but sometimes intrusive; patient was able to set goal to talk with staff 1:1 when having feelings of SI; patient is taking medications as prescribed and tolerating medications; patient is attending all groups and sometimes monopolizing

## 2013-07-09 NOTE — Progress Notes (Signed)
Recreation Therapy Notes   Date: 09.30.2014 Time: 2:30pm Location: 300 [address] Dayroom  Group Topic: Software engineer Activities (AAA)  Behavioral Response: Engaged, Attentive, Appropriate  Affect: Euthymic  Clinical Observations/Feedback: Dog Team: Education officer, museum. Patient interacted appropriately with peer, dog team, LRT and MHT.   [name] Aundra Espin, LRT/CTRS  Jearl Klinefelter 07/09/2013 3:58 PM

## 2013-07-09 NOTE — [facility] Group Notes (Signed)
The focus of this group is to educate the patient on the purpose and policies of crisis stabilization and provide a format to answer questions about their admission.  The group details unit policies and expectations of patients while admitted.  Stretching exercises done.  Pt attended and was engaged in the group however seemed to try and monopolize the group and needed redirection several times.  Pt focused on medications and the fact that she was not getting benzodiazepines while here at [facility].  Pt is very enmeshed with Casimiro Needle, another pt on the hall.
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
## 2013-07-09 NOTE — Progress Notes (Signed)
[facility] MD Progress Note  07/09/2013 3:37 PM Rita Carey  MRN:  [id number] Subjective:  Claimed severe anxiety, point of building up to a persistent panic. states that she cant handle it. She states she thinks she has the Opioids under control but that the panic she cant deal with. She is concerned that she is not going to be able to function if the panic was to take over. She states that she has some Klonopin left and she plans to take it when she gets out, but concerned that if the panic gets established, she will not be able to get them under control. She states she has tried multiple medications in the past and klonopin seems the only one that works for her even at the 1 mg BID dose.  Diagnosis:   DSM5: Schizophrenia Disorders:   Obsessive-Compulsive Disorders:   Trauma-Stressor Disorders:   Substance/Addictive Disorders:  Opioid Disorder - Severe (304.00) Depressive Disorders:  Major Depressive Disorder - Moderate (296.22)  Axis I: Panic Disorder and Substance Induced Mood Disorder  ADL's:  Intact  Sleep: Poor  Appetite:  Fair  Suicidal Ideation:  Plan:  denies Intent:  denies Means:  denies Homicidal Ideation:  Plan:  denies Intent:  denies Means:  denies AEB (as evidenced by):  Psychiatric Specialty Exam: Review of Systems  Constitutional: Negative.   HENT: Negative.   Eyes: Negative.   Respiratory: Negative.   Cardiovascular: Negative.   Gastrointestinal: Negative.   Genitourinary: Negative.   Musculoskeletal: Negative.   Skin: Negative.   Neurological: Negative.   Endo/Heme/Allergies: Negative.   Psychiatric/Behavioral: Positive for depression and substance abuse. The patient is nervous/anxious.     Blood pressure 105/71, pulse 91, temperature 97.5 F (36.4 C), temperature source Oral, resp. rate 16, height 5\' 3"  (1.6 m), weight 44.906 kg (99 lb), last menstrual period 07/03/2013.Body mass index is 17.54 kg/(m^2).  General Appearance: Fairly Groomed  Proofreader::  Fair  Speech:  Clear and Coherent and broken at times  Volume:  fluctuates  Mood:  Anxious and fear of losing control  Affect:  Tearful and anxious, tense, frustrated, fear  Thought Process:  Coherent and Goal Directed  Orientation:  Full (Time, Place, and Person)  Thought Content:  symptoms, attempts to cope, fear of losing control  Suicidal Thoughts:  No  Homicidal Thoughts:  No  Memory:  Immediate;   Fair Recent;   Fair Remote;   Fair  Judgement:  Fair  Insight:  superficial  Psychomotor Activity:  Restlessness  Concentration:  Fair  Recall:  Fair  Akathisia:  No  Handed:    AIMS (if indicated):     Assets:  Desire for Improvement  Sleep:  Number of Hours: 6   Current Medications: Current Facility-Administered Medications  Medication Dose Route Frequency Provider Last Rate Last Dose  . acetaminophen (TYLENOL) tablet 650 mg  650 mg Oral Q6H PRN Audrea Muscat, NP   650 mg at 07/09/13 0536  . alum & mag hydroxide-simeth (MAALOX/MYLANTA) 200-200-20 MG/5ML suspension 30 mL  30 mL Oral Q4H PRN Evanna Janann August, NP      . atomoxetine (STRATTERA) capsule 25 mg  25 mg Oral QHS Jorje Guild, PA-C   25 mg at 07/08/13 2141  . clonazePAM (KLONOPIN) tablet 1 mg  1 mg Oral BID Rachael Fee, MD   1 mg at 07/09/13 1027  . cloNIDine (CATAPRES) tablet 0.1 mg  0.1 mg Oral QAC breakfast Verne Spurr, PA-C   0.1 mg at 07/09/13  1610  . diphenhydrAMINE (BENADRYL) capsule 50 mg  50 mg Oral QHS PRN Verne Spurr, PA-C   50 mg at 07/06/13 0027  . feeding supplement (RESOURCE BREEZE) liquid 1 Container  1 Container Oral BID BM Lavena Bullion, RD   1 Container at 07/09/13 1400  . gabapentin (NEURONTIN) capsule 300 mg  300 mg Oral TID Jorje Guild, PA-C   300 mg at 07/09/13 1159  . magnesium hydroxide (MILK OF MAGNESIA) suspension 30 mL  30 mL Oral Daily PRN Evanna Janann August, NP      . multivitamin with minerals tablet 1 tablet  1 tablet Oral Daily Evanna Janann August, NP   1 tablet at  07/09/13 0750  . nicotine (NICODERM CQ - dosed in mg/24 hours) patch 21 mg  21 mg Transdermal Daily Nehemiah Settle, MD   21 mg at 07/09/13 0750  . thiamine (B-1) injection 100 mg  100 mg Intramuscular Once Evanna Cori Burkett, NP      . thiamine (VITAMIN B-1) tablet 100 mg  100 mg Oral Daily Evanna Cori Merry Proud, NP   100 mg at 07/09/13 0750  . traZODone (DESYREL) tablet 50 mg  50 mg Oral QHS,MR X 1 Jorje Guild, PA-C   50 mg at 07/08/13 2317  . zolpidem (AMBIEN) tablet 5 mg  5 mg Oral QHS PRN Nehemiah Settle, MD   5 mg at 07/08/13 2145    Lab Results: No results found for this or any previous visit (from the past 48 hour(s)).  Physical Findings: AIMS: Facial and Oral Movements Muscles of Facial Expression: None, normal Lips and Perioral Area: None, normal Jaw: None, normal Tongue: None, normal,Extremity Movements Upper (arms, wrists, hands, fingers): None, normal Lower (legs, knees, ankles, toes): None, normal, Trunk Movements Neck, shoulders, hips: None, normal, Overall Severity Severity of abnormal movements (highest score from questions above): None, normal Incapacitation due to abnormal movements: None, normal Patient's awareness of abnormal movements (rate only patient's report): No Awareness, Dental Status Current problems with teeth and/or dentures?: No Does patient usually wear dentures?: No  CIWA:  CIWA-Ar Total: 1 COWS:  COWS Total Score: 6  Treatment Plan Summary: Daily contact with patient to assess and evaluate symptoms and progress in treatment Medication management  Plan: Supportive approach/coping skills/relapse prevention/CBT/Mindfulness           After discussing pros and cons we decided to resume the klonopin, get the panic under           control.   Medical Decision Making Problem Points:  Review of psycho-social stressors (1) Data Points:  Review of medication regiment & side effects (2) Review of new medications or change in dosage (2)  I  certify that inpatient services furnished can reasonably be expected to improve the patient's condition.   Rita Carey A 07/09/2013, 3:37 PM

## 2013-07-09 NOTE — [facility] Group Notes (Signed)
[facility] LCSW Group Therapy  07/09/2013 1:51 PM  Type of Therapy:  Group Therapy  Participation Level:  Minimal  Participation Quality:  Inattentive  Affect:  Flat  Cognitive:  Lacking  Insight:  Limited  Engagement in Therapy:  None  Modes of Intervention:  Discussion, Education, Exploration, Socialization and Support  Summary of Progress/Problems: MHA Speaker came to talk about his personal journey with substance abuse and addiction. The pt processed ways by which to relate to the speaker. MHA speaker provided handouts and educational information pertaining to groups and services offered by the [facility]. Rita Carey was inattentive and disengaged throughout today's group.She wrote in her journal as speaker shared his personal experiences with MI and SA. Rita Carey does not appear to be making progress in the group setting AEB her inattention and lack of interaction with other group members. Rita Carey presented with depressed mood and flat affect.    Smart, [name] 07/09/2013, 1:51 PM

## 2013-07-09 NOTE — Progress Notes (Signed)
Adult Psychoeducational Group Note  Date:  07/09/2013 Time:  11:56 PM  Group Topic/Focus:  Wrap-Up Group:   The focus of this group is to help patients review their daily goal of treatment and discuss progress on daily workbooks.  Participation Level:  Active  Participation Quality:  Appropriate  Affect:  Appropriate  Cognitive:  Appropriate  Insight: Appropriate  Engagement in Group:  Engaged  Modes of Intervention:  Discussion, Education, Socialization and Support  Additional Comments:  Pt stated that she is in the hospital for opiate addiction. 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   Laural Benes, Zowie Lundahl 07/09/2013, 11:56 PM

## 2013-07-10 MED ORDER — ZOLPIDEM TARTRATE 5 MG PO TABS: 5.0000 mg | ORAL_TABLET | Freq: Every evening | ORAL | Status: DC | PRN

## 2013-07-10 MED ORDER — TRAZODONE HCL 50 MG PO TABS: 50.0000 mg | ORAL_TABLET | Freq: Every evening | ORAL | Status: DC | PRN

## 2013-07-10 MED ORDER — ATOMOXETINE HCL 25 MG PO CAPS: 25.0000 mg | ORAL_CAPSULE | Freq: Every day | ORAL | Status: DC

## 2013-07-10 MED ORDER — CLONAZEPAM 1 MG PO TABS
1.0000 mg | ORAL_TABLET | Freq: Once | ORAL | Status: AC
  Administered 2013-07-10: 1 mg via ORAL
  Filled 2013-07-10: qty 1

## 2013-07-10 MED ORDER — GABAPENTIN 300 MG PO CAPS: 300.0000 mg | ORAL_CAPSULE | Freq: Three times a day (TID) | ORAL | Status: DC

## 2013-07-10 MED ORDER — CLONAZEPAM 2 MG PO TABS: 1.0000 mg | ORAL_TABLET | Freq: Two times a day (BID) | ORAL | Status: DC | PRN

## 2013-07-10 NOTE — Tx Team (Signed)
Interdisciplinary Treatment Plan Update (Adult)  Date: 07/10/2013  Time Reviewed:  9:45 AM  Progress in Treatment: Attending groups: Yes Participating in groups:  Yes Taking medication as prescribed:  Yes Tolerating medication:  Yes Family/Significant othe contact made: N/A Patient understands diagnosis:  Yes Discussing patient identified problems/goals with staff:  Yes Medical problems stabilized or resolved:  Yes Denies suicidal/homicidal ideation: Yes Issues/concerns per patient self-inventory:  Yes Other:  New problem(s) identified: N/A  Discharge Plan or Barriers: Pt will follow up at [facility] [place] for medication management and therapy as well as NA meetings for added support.  Reason for Continuation of Hospitalization: Stable to d/c today  Comments: N/A  Estimated length of stay: D/C today  For review of initial/current patient goals, please see plan of care.  Attendees: Patient:     Family:     Physician:  Dr. Dub Mikes 07/10/2013 10:22 AM   Nursing:   Burnetta Sabin, RN 07/10/2013 10:22 AM   Clinical Social Worker:  Reyes Ivan, LCSWA 07/10/2013 10:22 AM   Other: Onnie Boer, RN case manager 07/10/2013 10:22 AM   Other:  Trula Slade, LCSWA 07/10/2013 10:22 AM   Other:  Roswell Miners, RN 07/10/2013 10:22 AM   Other:     Other:    Other:    Other:    Other:    Other:    Other:     Scribe for Treatment Team:   Carmina Miller, 07/10/2013 , 10:22 AM

## 2013-07-10 NOTE — Progress Notes (Signed)
[facility] Adult Case Management Discharge Plan :  Will you be returning to the same living situation after discharge: Yes,  returning to own home with roommates At discharge, do you have transportation home?:Yes,  roommates will pick pt up Do you have the ability to pay for your medications:Yes,  access to meds  Release of information consent forms completed and in the chart;  Patient's signature needed at discharge.  Patient to Follow up at: Follow-up Information   Follow up with [facility] [place] On 07/11/2013. (Walk in on this date for hospital discharge appointment, for medication management and therapy.  Walk in clinic is Monday - Friday 8 am - 3 pm)    Contact information:   [address], [place] [address] Phone: [phone] Fax: [phone]      Patient denies SI/HI:   Yes,  denies SI/HI    Safety Planning and Suicide Prevention discussed:  Yes,  discussed with pt.  N/A to contact family/friends due to no SI on admission.   Carmina Miller 07/10/2013, 10:23 AM

## 2013-07-10 NOTE — [facility] Group Notes (Signed)
[facility] LCSW Aftercare Discharge Planning Group Note   07/10/2013 8:45 AM  Participation Quality:  Alert and Appropriate   Mood/Affect:  Appropriate and Calm  Depression Rating:  0  Anxiety Rating:  4  Thoughts of Suicide:  Pt denies SI/HI  Will you contract for safety?   Yes  Current AVH:  Pt denies  Plan for Discharge/Comments:  Pt attended discharge planning group and actively participated in group.  CSW provided pt with today's workbook.  Pt reports feeling stable to d/c today.  Pt will follow up at [facility] for medication management and therapy.  Pt states that she plans to attend NA meetings and a sponsor.  Pt will return home in [place].  No further needs voiced by pt at this time.    Transportation Means: Pt reports access to transportation - roommate will pick pt up  Supports: No supports mentioned at this time  Reyes Ivan, LCSWA 07/10/2013 9:31 AM

## 2013-07-10 NOTE — [facility]HH Suicide Risk Assessment (Signed)
Suicide Risk Assessment  Discharge Assessment     Demographic Factors:  Caucasian and Gay, lesbian, or bisexual orientation  Mental Status Per Nursing Assessment::   On Admission:     Current Mental Status by Physician: In full contact with reality. There are no suicidal ideas, plans or intent. Her mood is euthymic, but worried, affect is appropriate. There is no evidence of active withdrawal. She states she is committed to abstinence. She is planning to pursue the medications she is taking now on an outpatient basis. Still thinks she needs the Klonopin and is going to see if her outpatient provider can prescribe it for her. She is going to try to get another job as one of the co workers is her main source of drugs. She is also going to claim some space from her GF who is going to have to deal with her own addiction to alcohol.    Loss Factors: Loss of significant relationship  Historical Factors: NA  Risk Reduction Factors:   Employed and Positive social support  Continued Clinical Symptoms:  Severe Anxiety and/or Agitation Alcohol/Substance Abuse/Dependencies  Cognitive Features That Contribute To Risk:  Closed-mindedness Polarized thinking Thought constriction (tunnel vision)    Suicide Risk:  Minimal: No identifiable suicidal ideation.  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 Insomnia   . Anxiety   . Anxiety    AXIS IV:  other psychosocial or environmental problems AXIS V:  61-70 mild symptoms  Plan Of Care/Follow-up recommendations:  Activity:  as tolerated Diet:  regular Follow up outpatient basis Is patient on multiple antipsychotic therapies at  discharge:  No   Has Patient had three or more failed trials of antipsychotic monotherapy by history:  No  Recommended Plan for Multiple Antipsychotic Therapies: NA  Rita Carey A 07/10/2013, 12:39 PM

## 2013-07-10 NOTE — B[facility] Group Notes (Signed)
[facility] LCSW Group Therapy  07/10/2013  1:15 PM   Type of Therapy:  Group Therapy  Participation Level:  Active  Participation Quality:  Appropriate and Attentive  Affect:  Appropriate and Calm  Cognitive:  Alert and Appropriate  Insight:  Developing/Improving and Engaged  Engagement in Therapy:  Developing/Improving and Engaged  Modes of Intervention:  Clarification, Confrontation, Discussion, Education, Exploration, Limit-setting, Orientation, Problem-solving, Rapport Building, Dance movement psychotherapist, Socialization and Support  Summary of Progress/Problems: The topic for group today was emotional regulation.  This group focused on both positive and negative emotion identification and allowed group members to process ways to identify feelings, regulate negative emotions, and find healthy ways to manage internal/external emotions. Group members were asked to reflect on a time when their reaction to an emotion led to a negative outcome and explored how alternative responses using emotion regulation would have benefited them. Group members were also asked to discuss a time when emotion regulation was utilized when a negative emotion was experienced.  Pt sat quietly through group, engaging in some side conversation.  Pt actively listened to group discussion.    Reyes Ivan, LCSWA 07/10/2013 2:54 PM

## 2013-07-10 NOTE — Progress Notes (Signed)
Writer observed patient sitting in the dayroom watching tv with minimal interaction with peers. Writer spoke to patient at medication window 1:1,  where her hs medications were reviewed. Patient concerned that she will have trouble sleeping tonight and wants to know what she has other than her trazadone. Patient informed of medications, support and encouragement offered, safety maintained with 15 min checks. Will continue to monitor. Patient denies si/hi/a/v hallucinations.

## 2013-07-10 NOTE — Discharge Summary (Signed)
Physician Discharge Summary Note  Patient:  Rita Carey is an 32 y.o., female MRN:  [id number] DOB:  09-21-1981 Patient phone:  [phone] (home)  Patient address:   [address] [place] [address],   Date of Admission:  07/04/2013 Date of Discharge: 07/10/13  Reason for Admission:  Drug detox  Discharge Diagnoses: Active Problems:   Opioid dependence   Opioid use with withdrawal   Panic disorder   Unspecified episodic mood disorder   Substance induced mood disorder  Review of Systems  Constitutional: Negative.   HENT: Negative.   Eyes: Negative.   Respiratory: Negative.   Cardiovascular: Negative.   Gastrointestinal: Negative.   Genitourinary: Negative.   Musculoskeletal: Negative.   Skin: Negative.   Neurological: Negative.   Endo/Heme/Allergies: Negative.   Psychiatric/Behavioral: Positive for depression (Stabilized with medication prior to discharge) and substance abuse (Opioid dependence). Negative for suicidal ideas, hallucinations and memory loss. The patient is nervous/anxious (Stabilized with medication prior to discharge) and has insomnia (Stabilized with medication prior to discharge).     DSM5: Schizophrenia Disorders:  NA Obsessive-Compulsive Disorders:  NA Trauma-Stressor Disorders:  NA Substance/Addictive Disorders:  Opioid dependence Depressive Disorders:  Substance induced mood disorder  Axis Diagnosis:   AXIS I:  Opioid dependence. Opioid dependence, Substance induced mood disorder AXIS II:  Deferred AXIS III:   Past Medical History  Diagnosis Date  . Insomnia   . Anxiety   . Anxiety    AXIS IV:  other psychosocial or environmental problems and Substance dependence AXIS V:  63  Level of Care:  OP  Hospital Course: 32 year old WF presented to [facility] requesting detox from Heroin, methadone, alcohol and also increased anxiety. She had been clean for 6 years until her mother died in 03/01/23 of this year. She relapsed shortly after that  and now reports that she uses 5-6 bags of heroin a day, when she can get it she will drink methadone and prefers this because she says it lasts longer and she is able to work. She can not quantify how much she used last, and feels that it was about 10 days ago. She last used Heroine 48 hours ago. Rita Carey also reports drinking 1/2 pint of liquor every 3 days or so. She states she has never had DTs but she has had blackouts. She denies alcohol withdrawal seizures. Rita Carey also takes Welbutrin, Klonopin and Ambien given to her by [employer].  Upon admission in this hospital, and after admission assessment/evaluation coupled with toxicology/UDS reports that showed alcohol levels of <11, (+) opiate and THC, it was determined that [name] will need detoxification treatment protocol to stabilize her systems of drug intoxication and to combat the withdrawal symptoms of these substances as well. She was then started on clonidine detoxification treatment protocols for. She was also enrolled in group counseling sessions and activities where she was counseled and learned coping skills that should help her after discharge to cope better with her symptoms and manage her substance abuse/dependence issues for a much sustained sobreity. She also was enrolled/participated in the AA/NA meetings being offered and held on this unit. [name] rather presented with no other previously existing and or identifiable medical conditions that required treatment and or monitoring. She was monitored closely for any potential problems that may arise as a result of and or during detoxification treatment. Patient tolerated her detoxification treatment regimen without any significant adverse effects and or reactions reported and or presented.  Patient attended treatment team meeting this am  and met with the treatment team members. Her reason for admission, present symptoms, substance abuse issues, response to to treatment and discharge plans  discussed. Patient endorsed that she is doing well and stable for discharge to pursue the next phase of her substance abuse treatment. It was then agreed upon between patient and the team that she will be discharged to follow-up care at the [facility] of [place] in [place], [place] 100[address]/14 between 08:00 am and 03:00 PM. Patient has been informed and instructed that this is a walk-in appointment. The address, time, date and contact information for this clinic provided for patient in writing.   Upon discharge, patient adamantly denies suicidal, homicidal ideations, auditory, visual hallucinations, delusional thoughts, paranoia and or withdrawal symptoms. Patient left [facility] with all personal belongings in no apparent distress. She received 2 weeks worth supply samples of her [facility] discharged medications, including a 30 days worth prescriptions for all her discharge medications. Transportation per patient arrangement (room mate).  Consults:  psychiatry  Significant Diagnostic Studies:  labs: CBC with diff, CMP, UDS, Toxicology tests, U/A  Discharge Vitals:   Blood pressure 92/62, pulse 108, temperature 98 F (36.7 C), temperature source Oral, resp. rate 16, height 5\' 3"  (1.6 m), weight 44.906 kg (99 lb), last menstrual period 07/03/2013. Body mass index is 17.54 kg/(m^2). Lab Results:   No results found for this or any previous visit (from the past 72 hour(s)).  Physical Findings: AIMS: Facial and Oral Movements Muscles of Facial Expression: None, normal Lips and Perioral Area: None, normal Jaw: None, normal Tongue: None, normal,Extremity Movements Upper (arms, wrists, hands, fingers): None, normal Lower (legs, knees, ankles, toes): None, normal, Trunk Movements Neck, shoulders, hips: None, normal, Overall Severity Severity of abnormal movements (highest score from questions above): None, normal Incapacitation due to abnormal movements: None, normal Patient's awareness of abnormal movements  (rate only patient's report): No Awareness, Dental Status Current problems with teeth and/or dentures?: No Does patient usually wear dentures?: No  CIWA:  CIWA-Ar Total: 0 COWS:  COWS Total Score: 6  Psychiatric Specialty Exam: See Psychiatric Specialty Exam and Suicide Risk Assessment completed by Attending Physician prior to discharge.  Discharge destination:  Home  Is patient on multiple antipsychotic therapies at discharge:  No   Has Patient had three or more failed trials of antipsychotic monotherapy by history:  No  Recommended Plan for Multiple Antipsychotic Therapies: NA     Medication List       Indication   atomoxetine 25 MG capsule  Commonly known as:  STRATTERA  Take 1 capsule (25 mg total) by mouth at bedtime. X 1 week then increase to 2 capsules at bedtime For ADHD   Indication:  Attention Deficit Hyperactivity Disorder     clonazePAM 2 MG tablet  Commonly known as:  KLONOPIN  Take 0.5-1 tablets (1-2 mg total) by mouth 2 (two) times daily as needed. Take 1 tab in the morning and at bedtime and take 1/2 tab in the afternoon: For anxiety   Indication:  For anxiety     gabapentin 300 MG capsule  Commonly known as:  NEURONTIN  Take 1 capsule (300 mg total) by mouth 3 (three) times daily. For anxiety   Indication:  Anxiety     traZODone 50 MG tablet  Commonly known as:  DESYREL  Take 1 tablet (50 mg total) by mouth at bedtime and may repeat dose one time if needed. For sleep   Indication:  Trouble Sleeping     zolpidem  5 MG tablet  Commonly known as:  AMBIEN  Take 1 tablet (5 mg total) by mouth at bedtime as needed for sleep.   Indication:  Trouble Sleeping       Follow-up Information   Follow up with [facility] of the [place] On 07/11/2013. (Walk in on this date for hospital discharge appointment, for medication management and therapy.  Walk in clinic is Monday - Friday 8 am - 3 pm)    Contact information:   [address] [place] [address]  Phone: [phone] Fax: [phone]     Follow-up recommendations: Activity:  As tolerated Diet: As recommended by your primary care doctor. Keep all scheduled follow-up appointments as recommended.  Continue to work your relapse prevention plan Comments: Take all your medications as prescribed by your mental healthcare provider. Report any adverse effects and or reactions from your medicines to your outpatient provider promptly. Patient is instructed and cautioned to not engage in alcohol and or illegal drug use while on prescription medicines. In the event of worsening symptoms, patient is instructed to call the crisis hotline, 911 and or go to the nearest ED for appropriate evaluation and treatment of symptoms. Follow-up with your primary care provider for your other medical issues, concerns and or health care needs.    Total Discharge Time:  Greater than 30 minutes.  Signed: Sanjuana Kava, PMHNP-BC 07/10/2013, 1:46 PM Agree with assessment and plan Madie Reno A. Dub Mikes, M.D.

## 2013-07-10 NOTE — Progress Notes (Signed)
Patient ID: Rita Carey, female   DOB: 09-10-1981, 32 y.o.   MRN: [id number] Patient was discharged per physician order; patient denies SI/HI and A/V hallucinations; patient received samples, letter, copy of AVS, and prescriptions after it was reviewed; patient has no other questions or concerns at this time; patient left the unit ambulatory after verbalizing and signing that she received all belongings

## 2013-07-15 NOTE — Progress Notes (Signed)
Patient Discharge Instructions:  After Visit Summary (AVS):   Faxed to:  07/15/13 Discharge Summary Note:   Faxed to:  07/15/13 Psychiatric Admission Assessment Note:   Faxed to:  07/15/13 Suicide Risk Assessment - Discharge Assessment:   Faxed to:  07/15/13 Faxed/Sent to the Next Level Care provider:  07/15/13 Faxed to [facility] of the [facility] @ [phone]  Jerelene Redden, 07/15/2013, 2:39 PM

## 2013-12-28 ENCOUNTER — Ambulatory Visit (HOSPITAL_COMMUNITY)
Admission: RE | Admit: 2013-12-28 | Discharge: 2013-12-28 | Disposition: A | Discharge status: Home / Self Care | Payer: No Typology Code available for payment source | Attending: Psychiatry | Admitting: Psychiatry

## 2013-12-28 ENCOUNTER — Encounter (HOSPITAL_COMMUNITY): Payer: Self-pay | Admitting: Emergency Medicine

## 2013-12-28 ENCOUNTER — Emergency Department (HOSPITAL_COMMUNITY)
Admission: EM | Admit: 2013-12-28 | Discharge: 2013-12-28 | Disposition: A | Discharge status: Home / Self Care | Payer: Self-pay | Attending: Emergency Medicine | Admitting: Emergency Medicine

## 2013-12-28 ENCOUNTER — Encounter (HOSPITAL_COMMUNITY): Payer: Self-pay | Admitting: *Deleted

## 2013-12-28 DIAGNOSIS — F17200 Nicotine dependence, unspecified, uncomplicated: Secondary | ICD-10-CM | POA: Insufficient documentation

## 2013-12-28 DIAGNOSIS — F1910 Other psychoactive substance abuse, uncomplicated: Secondary | ICD-10-CM

## 2013-12-28 DIAGNOSIS — F1110 Opioid abuse, uncomplicated: Secondary | ICD-10-CM | POA: Insufficient documentation

## 2013-12-28 DIAGNOSIS — F411 Generalized anxiety disorder: Secondary | ICD-10-CM | POA: Insufficient documentation

## 2013-12-28 DIAGNOSIS — F329 Major depressive disorder, single episode, unspecified: Secondary | ICD-10-CM | POA: Insufficient documentation

## 2013-12-28 DIAGNOSIS — F1210 Cannabis abuse, uncomplicated: Secondary | ICD-10-CM | POA: Insufficient documentation

## 2013-12-28 DIAGNOSIS — Z79899 Other long term (current) drug therapy: Secondary | ICD-10-CM | POA: Insufficient documentation

## 2013-12-28 DIAGNOSIS — R259 Unspecified abnormal involuntary movements: Secondary | ICD-10-CM | POA: Insufficient documentation

## 2013-12-28 DIAGNOSIS — F1310 Sedative, hypnotic or anxiolytic abuse, uncomplicated: Secondary | ICD-10-CM | POA: Insufficient documentation

## 2013-12-28 DIAGNOSIS — G4700 Insomnia, unspecified: Secondary | ICD-10-CM | POA: Insufficient documentation

## 2013-12-28 DIAGNOSIS — R6883 Chills (without fever): Secondary | ICD-10-CM | POA: Insufficient documentation

## 2013-12-28 DIAGNOSIS — R110 Nausea: Secondary | ICD-10-CM | POA: Insufficient documentation

## 2013-12-28 DIAGNOSIS — F3289 Other specified depressive episodes: Secondary | ICD-10-CM | POA: Insufficient documentation

## 2013-12-28 DIAGNOSIS — IMO0001 Reserved for inherently not codable concepts without codable children: Secondary | ICD-10-CM | POA: Insufficient documentation

## 2013-12-28 DIAGNOSIS — R4589 Other symptoms and signs involving emotional state: Secondary | ICD-10-CM | POA: Insufficient documentation

## 2013-12-28 HISTORY — DX: Other psychoactive substance abuse, uncomplicated: F19.10

## 2013-12-28 HISTORY — DX: Major depressive disorder, single episode, unspecified: F32.9

## 2013-12-28 HISTORY — DX: Depression, unspecified: F32.A

## 2013-12-28 LAB — URINALYSIS, ROUTINE W REFLEX MICROSCOPIC
Bilirubin Urine: NEGATIVE
GLUCOSE, UA: NEGATIVE mg/dL
HGB URINE DIPSTICK: NEGATIVE
Ketones, ur: 15 mg/dL — AB
LEUKOCYTES UA: NEGATIVE
Nitrite: NEGATIVE
Protein, ur: NEGATIVE mg/dL
SPECIFIC GRAVITY, URINE: 1.017 (ref 1.005–1.030)
Urobilinogen, UA: 0.2 mg/dL (ref 0.0–1.0)
pH: 7.5 (ref 5.0–8.0)

## 2013-12-28 LAB — COMPREHENSIVE METABOLIC PANEL
ALK PHOS: 73 U/L (ref 39–117)
ALT: 14 U/L (ref 0–35)
AST: 25 U/L (ref 0–37)
Albumin: 3.8 g/dL (ref 3.5–5.2)
BILIRUBIN TOTAL: 0.3 mg/dL (ref 0.3–1.2)
BUN: 9 mg/dL (ref 6–23)
CHLORIDE: 104 meq/L (ref 96–112)
CO2: 25 mEq/L (ref 19–32)
Calcium: 9.1 mg/dL (ref 8.4–10.5)
Creatinine, Ser: 0.6 mg/dL (ref 0.50–1.10)
GLUCOSE: 120 mg/dL — AB (ref 70–99)
Potassium: 4.1 mEq/L (ref 3.7–5.3)
SODIUM: 141 meq/L (ref 137–147)
Total Protein: 7.4 g/dL (ref 6.0–8.3)

## 2013-12-28 LAB — CBC
HCT: 39.1 % (ref 36.0–46.0)
Hemoglobin: 13.9 g/dL (ref 12.0–15.0)
MCH: 33.5 pg (ref 26.0–34.0)
MCHC: 35.5 g/dL (ref 30.0–36.0)
MCV: 94.2 fL (ref 78.0–100.0)
PLATELETS: 257 10*3/uL (ref 150–400)
RBC: 4.15 MIL/uL (ref 3.87–5.11)
RDW: 11.7 % (ref 11.5–15.5)
WBC: 8.7 10*3/uL (ref 4.0–10.5)

## 2013-12-28 LAB — RAPID URINE DRUG SCREEN, HOSP PERFORMED
Amphetamines: NOT DETECTED
Barbiturates: NOT DETECTED
Benzodiazepines: POSITIVE — AB
Cocaine: NOT DETECTED
OPIATES: POSITIVE — AB
Tetrahydrocannabinol: POSITIVE — AB

## 2013-12-28 LAB — SALICYLATE LEVEL

## 2013-12-28 LAB — ETHANOL: Alcohol, Ethyl (B): <11 mg/dL (ref 0–11)

## 2013-12-28 LAB — ACETAMINOPHEN LEVEL: Acetaminophen (Tylenol), Serum: <15 ug/mL (ref 10–30)

## 2013-12-28 MED ORDER — CLONAZEPAM 0.5 MG PO TABS
0.5000 mg | ORAL_TABLET | Freq: Two times a day (BID) | ORAL | Status: DC
  Administered 2013-12-28: 0.5 mg via ORAL
  Filled 2013-12-28: qty 1

## 2013-12-28 MED ORDER — ZOLPIDEM TARTRATE 5 MG PO TABS: 5.0000 mg | ORAL_TABLET | Freq: Every evening | ORAL | Status: DC | PRN

## 2013-12-28 MED ORDER — NAPROXEN 250 MG PO TABS: 500.0000 mg | ORAL_TABLET | Freq: Two times a day (BID) | ORAL | Status: DC | PRN

## 2013-12-28 MED ORDER — CLONIDINE HCL 0.1 MG PO TABS: 0.1000 mg | ORAL_TABLET | Freq: Every day | ORAL | Status: DC

## 2013-12-28 MED ORDER — LOPERAMIDE HCL 2 MG PO CAPS: 2.0000 mg | ORAL_CAPSULE | ORAL | Status: DC | PRN

## 2013-12-28 MED ORDER — ONDANSETRON 8 MG PO TBDP: 8.0000 mg | ORAL_TABLET | Freq: Once | ORAL | Status: DC

## 2013-12-28 MED ORDER — PROMETHAZINE HCL 25 MG/ML IJ SOLN
12.5000 mg | Freq: Once | INTRAMUSCULAR | Status: AC
  Administered 2013-12-28: 12.5 mg via INTRAVENOUS
  Filled 2013-12-28: qty 1

## 2013-12-28 MED ORDER — CLONIDINE HCL 0.1 MG PO TABS: 0.1000 mg | ORAL_TABLET | Freq: Four times a day (QID) | ORAL | Status: DC | PRN

## 2013-12-28 MED ORDER — SODIUM CHLORIDE 0.9 % IV BOLUS (SEPSIS)
500.0000 mL | Freq: Once | INTRAVENOUS | Status: AC
  Administered 2013-12-28: 500 mL via INTRAVENOUS

## 2013-12-28 MED ORDER — IBUPROFEN 400 MG PO TABS
600.0000 mg | ORAL_TABLET | Freq: Once | ORAL | Status: AC
  Administered 2013-12-28: 600 mg via ORAL
  Filled 2013-12-28 (×2): qty 1

## 2013-12-28 MED ORDER — DIPHENHYDRAMINE HCL 50 MG/ML IJ SOLN
12.5000 mg | Freq: Once | INTRAMUSCULAR | Status: AC
  Administered 2013-12-28: 12.5 mg via INTRAVENOUS
  Filled 2013-12-28: qty 1

## 2013-12-28 MED ORDER — PROMETHAZINE HCL 25 MG PO TABS
25.0000 mg | ORAL_TABLET | Freq: Four times a day (QID) | ORAL | Status: DC | PRN
  Filled 2013-12-28: qty 1

## 2013-12-28 MED ORDER — HYDROXYZINE HCL 25 MG PO TABS
25.0000 mg | ORAL_TABLET | Freq: Four times a day (QID) | ORAL | Status: DC | PRN
  Administered 2013-12-28: 25 mg via ORAL
  Filled 2013-12-28: qty 1

## 2013-12-28 MED ORDER — NICOTINE 21 MG/24HR TD PT24
21.0000 mg | MEDICATED_PATCH | Freq: Every day | TRANSDERMAL | Status: DC
  Administered 2013-12-28: 21 mg via TRANSDERMAL
  Filled 2013-12-28: qty 1

## 2013-12-28 MED ORDER — CLONIDINE HCL 0.1 MG PO TABS
0.1000 mg | ORAL_TABLET | Freq: Four times a day (QID) | ORAL | Status: DC
  Administered 2013-12-28: 0.1 mg via ORAL
  Filled 2013-12-28: qty 1

## 2013-12-28 MED ORDER — ONDANSETRON HCL 4 MG PO TABS: 4.0000 mg | ORAL_TABLET | Freq: Three times a day (TID) | ORAL | Status: DC | PRN

## 2013-12-28 MED ORDER — PROMETHAZINE HCL 25 MG PO TABS: 25.0000 mg | ORAL_TABLET | Freq: Four times a day (QID) | ORAL | Status: DC | PRN

## 2013-12-28 MED ORDER — IBUPROFEN 400 MG PO TABS
600.0000 mg | ORAL_TABLET | Freq: Three times a day (TID) | ORAL | Status: DC | PRN
  Administered 2013-12-28: 600 mg via ORAL
  Filled 2013-12-28 (×2): qty 1

## 2013-12-28 MED ORDER — CLONIDINE HCL 0.1 MG PO TABS: 0.1000 mg | ORAL_TABLET | ORAL | Status: DC

## 2013-12-28 MED ORDER — ONDANSETRON 4 MG PO TBDP
4.0000 mg | ORAL_TABLET | Freq: Four times a day (QID) | ORAL | Status: DC | PRN
  Administered 2013-12-28: 4 mg via ORAL
  Filled 2013-12-28: qty 1

## 2013-12-28 MED ORDER — ALUM & MAG HYDROXIDE-SIMETH 200-200-20 MG/5ML PO SUSP: 30.0000 mL | ORAL | Status: DC | PRN

## 2013-12-28 MED ORDER — DICYCLOMINE HCL 20 MG PO TABS
20.0000 mg | ORAL_TABLET | Freq: Four times a day (QID) | ORAL | Status: DC | PRN
  Administered 2013-12-28: 20 mg via ORAL
  Filled 2013-12-28: qty 1

## 2013-12-28 MED ORDER — METHOCARBAMOL 500 MG PO TABS
500.0000 mg | ORAL_TABLET | Freq: Three times a day (TID) | ORAL | Status: DC | PRN
  Administered 2013-12-28: 500 mg via ORAL
  Filled 2013-12-28: qty 1

## 2013-12-28 MED ORDER — CLONIDINE HCL 0.1 MG PO TABS: 0.2000 mg | ORAL_TABLET | Freq: Once | ORAL | Status: DC
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
## 2013-12-28 NOTE — Discharge Instructions (Signed)
Avoid drug use.  See resource guide provided for additional community resources for substance abuse issues. Return to ER if worse, new symptoms, persistent vomiting, other concern.   Substance Abuse Your exam indicates that you have a problem with substance abuse. Substance abuse is the misuse of alcohol or drugs that causes problems in family life, friendships, and work relationships. Substance abuse is the most important cause of premature illness, disability, and death in our society. It is also the greatest threat to a person's mental and spiritual well being. Substance abuse can start out in an innocent way, such as social drinking or taking a little extra medication prescribed by your doctor. No one starts out with the intention of becoming an alcoholic or an addict. Substance abuse victims cannot control their use of alcohol or drugs. They may become intoxicated daily or go on weekend binges. Often there is a strong desire to quit, but attempts to stop using often fail. Encounters with law enforcement or conflicts with family members, friends, and work associates are signs of a potential problem. Recovery is always possible, although the craving for some drugs makes it difficult to quit without assistance. Many treatment programs are available to help people stop abusing alcohol or drugs. The first step in treatment is to admit you have a problem. This is a major hurdle because denial is a powerful force with substance abuse. Alcoholics Anonymous, Narcotics Anonymous, Cocaine Anonymous, and other recovery groups and programs can be very useful in helping people to quit. If you do not feel okay about your drug or alcohol use and if it is causing you trouble, we want to encourage you to talk about it with your doctor or with someone from a recovery group who can help you. You could also call the [employer] on Drug Abuse at 1-800-662-HELP. It is up to you to take the first step. AL-ANON and  ALA-TEEN are support groups for friends and family members of an alcohol or drug dependent person. The people who love and care for the alcoholic or addicted person often need help, too. For information about these organizations, check your phone directory or call a local alcohol or drug treatment center. Document Released: 11/03/2004 Document Revised: 12/19/2011 Document Reviewed: 09/27/2008 [facility] Patient Information 2014 [place], [place].   Opiate Dependence The above names are all different names used for opiates. Opiates are any medication made from the poppy plant. It is a medication which produces a calming, sleepy effect. Because achieving this effect requires more and more of this drug to get the same result, opiates become addictive. A family history of addiction or an addictive personality increases the risk. When drug use is interfering with normal living activities, it has become abuse. This includes problems with family, friends, and your job. Psychological dependence has developed when your mind tells you that the drug is needed. This emotional dependence is the craving for the "high" that some drugs cause. Emotional addicts always want this high instead of the way they are feeling when not using the drug. This is difficult to overcome. This is usually followed by physical dependence that has developed when continuing increases of drug are required to get the same feeling or "high". This is known as addiction or chemical dependency.  SIGNS OF CHEMICAL DEPENDENCY  Friends and family tell you there is a problem.  Fighting when using drugs.  Mood swings and insomnia.  Forgetfulness.  Not remembering what you do while using (blackouts).  Feeling sick from  using drugs but you continue using.  Lie about use or amounts of drugs (chemicals) used.  Need chemicals to get you going.  Suffer in work International aid/development worker or school because of drug use.  Need drugs to relate to people or feel  comfortable in social situations.  Use drugs to forget problems.  Difficulty with attention.  Neglecting obligations. If you answered "yes" to any or some of the above signs of chemical dependency, you may have a problem. The longer the use of drugs continues, the greater the problems will become. Do not experiment with drugs.  SIGNS AND SYMPTOMS OF PHYSICAL DEPENDENCE  Sudden stopping of the narcotic is uncomfortable when tolerance has developed. Physical problems will develop. This is called withdrawal.  How bad the withdrawal is varies from person to person. Some of the smaller problems are:  Tremors in the hands or shakes and jitters.  A fast heart rate and rapid breathing.  An increase in temperature.  Anxiety and panic attacks with bad dreams.  Muscle aches and pains. You may have more serious problems. These can include:  Feeling sick to your stomach or throwing up.  Dehydration develops if you cannot keep fluids down.  Tremors and chills or fever with sweating and anxiety.  Hallucinations and cravings.  Body aching with restlessness and insomnia.  Seizures or convulsions. These problems can last for months. These uncomfortable feeling can cause you to use drugs again just to feel better. OTHER HEALTH RISKS OF NARCOTICS USE INCLUDE:  The increased possibility of getting AIDS, hepatitis, other infectious or sexually transmitted diseases.  Unplanned pregnancy and having a baby born addicted to narcotics. You then put your baby through painful withdrawal symptoms including: shaking, jerking, and crying in pain. Many babies die. Other babies have lifelong disabilities and learning problems. TREATMENT  Effective treatment and management of narcotic addiction requires a multi-faceted, team approach that includes:  Medications to minimize the symptoms of narcotic withdrawal.  Medications to reduce the need for continued narcotic use.  Medical management of unrelated  medical problems.  Pain management.  Social services.  Psychological treatment.  Behavioral therapies. Stopping your dependence is hard but may save your life. If you continue using drugs, the only possible outcome is loss of self respect and esteem, violence, and eventually prison or death. To stop abuse, you must first realize you have a problem. You control your behavior. Once you realize this, commit to quitting. Addiction is a disease. You need medical help to get well. Your caregiver can counsel you or refer you for counseling. The best way to do this is to seek out an organization for help. These include Alcoholics Anonymous, Narcotics Anonymous, or the [employer] on Alcoholism and Drug Dependence. HOW TO STAY CLEAN WHEN YOU HAVE QUIT USING  Develop healthy activities.  Form friendships with those who do not use drugs.  Stay away from all drugs. Alcohol will lessen your ability to say no.  Have ready excuses available about why you cannot use. If that is difficult, stay away from people who knew you used. HOME CARE INSTRUCTIONS   Both prescription medicines and over-the-counter medicines are used as part of the treatment. It is critical to follow the recommendations of your caregiver at home. Any additional over-the-counter medications or changes in the recommended treatment plan should be discussed with your caregiver first.  It is important to keep fluids down. Juices, soda, Gatorade, or a mixture will help prevent dehydration.  Be prepared for the emotional swings  of quitting.  Call your local emergency services if seizures (convulsions) occur or if you are unable to keep liquids down.  Keep a written record of medications you take and times given. Overcoming addictions takes years. Over time you will have a lessening of the craving for narcotics. Talk to your caregiver or a member of your support group if you need more help.  Addiction cannot be cured but it can be  stopped. Treatment centers are listed in the yellow pages under: Cocaine, Narcotics, and Alcoholics Anonymous. Most hospitals and clinics can refer you to a specialized care center. Document Released: 07/24/2007 Document Revised: 12/19/2011 Document Reviewed: 07/24/2007 [facility] Patient Information 2014 [place], [place].      Emergency Department Resource Guide 1) Find a Doctor and Pay Out of Pocket Although you won't have to find out who is covered by your insurance plan, it is a good idea to ask around and get recommendations. You will then need to call the office and see if the doctor you have chosen will accept you as a new patient and what types of options they offer for patients who are self-pay. Some doctors offer discounts or will set up payment plans for their patients who do not have insurance, but you will need to ask so you aren't surprised when you get to your appointment.  2) Contact Your Local Health Department Not all health departments have doctors that can see patients for sick visits, but many do, so it is worth a call to see if yours does. If you don't know where your local health department is, you can check in your phone book. The CDC also has a tool to help you locate your state's health department, and many state websites also have listings of all of their local health departments.  3) Find a Walk-in Clinic If your illness is not likely to be very severe or complicated, you may want to try a walk in clinic. These are popping up all over the country in pharmacies, drugstores, and shopping centers. They're usually staffed by nurse practitioners or physician assistants that have been trained to treat common illnesses and complaints. They're usually fairly quick and inexpensive. However, if you have serious medical issues or chronic medical problems, these are probably not your best option.  No Primary Care Doctor: - Call Health Connect at  [phone] - they can help you locate a  primary care doctor that  accepts your insurance, provides certain services, etc. - Physician Referral Service- [phone]  Chronic Pain Problems: Organization         Address  Phone   Notes  [facility]  [phone] Patients need to be referred by their primary care doctor.   Medication Assistance: Organization         Address  Phone   Notes  [facility] Medication [facility] [address] [place] [place], [place] [address] [phone] --Must be a resident of [facility] -- Must have NO insurance coverage whatsoever (no Medicaid/ Medicare, etc.) -- The pt. MUST have a primary care doctor that directs their care regularly and follows them in the community   MedAssist  [phone]   [employer]  [phone]    Agencies that provide inexpensive medical care: Organization         Address  Phone   Notes  [facility]  [phone]   [facility]    [phone]   Women's  [facility] [address] [place], [place] [address] [phone]   [facility] [address] [place] [place] [phone]   Planned Parenthood    [phone]   [facility]    [phone]   [facility] and [facility]  [address], [place] Phone:  [phone], Fax:  [phone] Hours of Operation:  9 am - 6 pm, M-F.  Also accepts Medicaid/Medicare and self-pay.  [facility]  [address], [place] Phone: [phone], Fax: [phone]. Hours of Operation:  8:30 am - 5:30 pm, M-F.  Also accepts Medicaid and self-pay.  [facility] [place] [address], [place] Phone: [phone]   [facility] [address] Natasha Bence [place], [place] [phone], Ext. [phone] Mondays & Thursdays: 7-9 AM.  First 15 patients are seen on a first come, first serve basis.    Medicaid-accepting [facility]  Providers:  Organization         Address  Phone   Notes  [facility] [address], [place] [phone] Also accepts self-pay patients.  [facility] [address] Laurell Josephs [place], [place]  [phone]   [facility] [address], [place] [phone]   [facility] [address] [place] [phone]   Renaye Rakers [address], [place]   [phone] Only accepts [place] patients after they have their name applied to their card.   Self-Pay (no insurance) in [facility]:  Organization         Address  Phone   Notes  Sickle Cell Patients, [facility] Internal Medicine [address] [place], [place] [phone]    [facility] [address] [place], [place] [phone]   [facility] [place]  [address], [place] [phone]   [facility]/Dr. Osei-Bonsu  [address] [place] or [address], [place] [phone] Phone number for both [place] and [place] locations is the same.  [facility]gent Medical and [facility] [address], [place] [phone]   [facility] [address] [place] or [address] [phone]   [facility] [address], [place] [phone], phone; [phone], fax Sees patients 1st and 3rd Saturday of every month.  Must not qualify for public or private insurance (i.e. Medicaid, Medicare, [place] Health Choice, Veterans' Benefits)  Household income should be no more than 200% of the poverty level The clinic cannot treat you if you are pregnant or think you are pregnant  Sexually transmitted diseases are not treated at the clinic.    Dental Care: Organization         Address  Phone  Notes  [facility] Department of [facility] [address] [place], [place] [phone] Accepts children up to age 27 who are enrolled in [place] or [place] Health Choice; pregnant women with a Medicaid card; and children who have applied for Medicaid or [place] Health Choice, but were declined, whose parents can pay a reduced fee at time of service.  [facility] Department of [facility]  [address], [place] [phone] Accepts children up to age 103 who are  enrolled in Medicaid or [place] Health Choice; pregnant women with a Medicaid card; and children who have applied for Medicaid or [place] Health Choice, but were declined, whose parents can pay a reduced fee at time of service.  [facility]  [address] [place], [place] [phone] Patients are seen by appointment only. Walk-ins are not accepted. [facility] will see patients 45 years of age and older. Monday - Tuesday (8am-5pm) Most Wednesdays (8:30-5pm) $30 per visit, cash only  [facility]  [address], [facility] [phone] Patients are seen by appointment only. Walk-ins are not accepted. [facility] will see patients 25 years of age and older. One Wednesday Evening (Monthly: Volunteer Based).  $30 per visit, cash only  [employer]  [phone] for adults; Children under age 24, call Graduate Pediatric Dentistry at [phone]. Children aged 31-14, please call [phone] to request a pediatric application.  Dental services are provided in all areas of dental care including fillings, crowns and bridges, complete and partial dentures, implants, gum treatment, root canals, and extractions. Preventive care is also provided. Treatment is provided to both adults and children. Patients are selected via a lottery and there is often a waiting list.   [facility] [address], [place]  [phone] [email or web address]   [facility]  [address] [place], [place] [phone], Ext. [phone] Second and Fourth Thursday of each month, opens at 6:30 AM; Clinic ends at 9 AM.  Patients are seen on a first-come first-served basis, and a limited number are seen during each clinic.   [facility]  [address] Ether Griffins [place], [place] [phone]   Eligibility Requirements You must have lived in [place], [place], or [place] counties for at least the last three months.   You cannot be eligible for state or federal sponsored [place], including [employer], [place], or [employer].   You generally cannot be eligible for healthcare insurance through your employer.    How to apply: Eligibility screenings are held every Tuesday and Wednesday afternoon from 1:00 pm until 4:00 pm. You do not need an appointment for the interview!  [facility] [address] [place], [place] [phone]   [facility]  [phone]   [facility] Health Department  [phone]   [facility] Health Department  [phone]    Behavioral Health Resources in the Community: Intensive Outpatient Programs Organization         Address  Phone  Notes  [facility] Services [address], [place], [place] [phone]   [facility] Outpatient [address] [place], [place] [phone]   [facility]: [facility] [address], [place], [place]  [phone]   [facility] Mental Health [address],  [place], [place] [phone] or [phone]   Substance Abuse Resources Organization         Address  Phone  Notes  Alcohol and Drug Services  [phone]   [facility]  [phone]   [facility]  [phone]   Floydene Flock  [phone]   Residential & Outpatient Substance Abuse Program  [phone]   Psychological Services Organization         Address  Phone  Notes  [facility]  [phone]    [facility]  [phone]   [facility] Mental Health [address], [place]  [phone] or [phone]    Mobile Crisis Teams Organization         Address  Phone  Notes  [facility], Mobile Crisis Care Unit  [phone]   [facility]  [address] [place], [place] [phone]   [facility] [address] [place] [phone]    Self-Help/Support Groups Organization         Address  Phone             Notes  [facility] [place] - variety of support groups  [phone]- [phone] Call for more information  Narcotics Anonymous (NA), [facility] [address], [place]  2 meetings at this location   Statisticianesidential Treatment Programs Organization         Address  Phone  Notes  [facility] [address],    [place]  [phone]   [facility]  [address], [place] [phone], [place], [place] [phone]   [facility] [address], [place] [phone] Admissions: 8am-3pm M-F  [facility] [address]    [place], [place] [phone]   [facility] [address], [place], [place] [phone]   [facility] [address]  [place], [place] [phone]   [facility] - Intensive Outpatient [address], [place], [place] [phone]   [facility] ([facility] [address]  [place], [place] [phone] or [phone]   [facility] ([facility]) [address] [place], [place] [phone] Accepts Medicaid  [facility] [address]  [place] [phone] Substance Abuse/Addiction Treatment   [facility] Behavioral Health Resources Organization         Address  Phone  Notes  [facility]  [phone]   Angie FavaJulie Brannon, PhD [address] [place], [place]   [phone] or [phone]    [facility]   [address] [place], [place] [phone]   [facility] [address], [place], [place] [phone]) [phone] Insurance/Medicaid/sponsorship through [facility]  [facility] [address]                                    [place], [place] [phone]) [phone] Therapy/tele-psych/case  [facility] [address].   [place], [place] [phone]    Dr. Lolly MustacheArfeen  [phone]   [facility]  [employer] [facility] 1) [address], [place] 2) [address], [place] 3)  [address], [place] [phone]  [phone]   [facility] Child Abuse Hotline [phone] or [phone](336) [phone] (After Hours)
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
## 2013-12-28 NOTE — ED Notes (Signed)
Pt states she has been using opiates x a few months.  Requesting detox.
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
## 2013-12-28 NOTE — Discharge Instructions (Signed)
°Emergency Department Resource Guide °1) Find a Doctor and Pay Out of Pocket °Although you won't have to find out who is covered by your insurance plan, it is a good idea to ask around and get recommendations. You will then need to call the office and see if the doctor you have chosen will accept you as a new patient and what types of options they offer for patients who are self-pay. Some doctors offer discounts or will set up payment plans for their patients who do not have insurance, but you will need to ask so you aren't surprised when you get to your appointment. ° °2) Contact Your Local Health Department °Not all health departments have doctors that can see patients for sick visits, but many do, so it is worth a call to see if yours does. If you don't know where your local health department is, you can check in your phone book. The CDC also has a tool to help you locate your state's health department, and many state websites also have listings of all of their local health departments. ° °3) Find a Walk-in Clinic °If your illness is not likely to be very severe or complicated, you may want to try a walk in clinic. These are popping up all over the country in pharmacies, drugstores, and shopping centers. They're usually staffed by nurse practitioners or physician assistants that have been trained to treat common illnesses and complaints. They're usually fairly quick and inexpensive. However, if you have serious medical issues or chronic medical problems, these are probably not your best option. ° °No Primary Care Doctor: °- Call [facility] at  832-8000 - they can help you locate a primary care doctor that  accepts your insurance, provides certain services, etc. °- Physician Referral Service- 1-800-533-3463 ° °Chronic Pain Problems: °Organization         Address  Phone   Notes  °[facility]  (336) 297-2271 Patients need to be referred by their primary care doctor.  ° °Medication  Assistance: °Organization         Address  Phone   Notes  °[place] [facility] [address] °[place], [place] [address] (336) 641-8030 --Must be a resident of [place] °-- Must have NO insurance coverage whatsoever (no Medicaid/ Medicare, etc.) °-- The pt. MUST have a primary care doctor that directs their care regularly and follows them in the community °  °[facility]  (866) 331-1348   °[employer]  (888) 892-1162   ° °Agencies that provide inexpensive medical care: °Organization         Address  Phone   Notes  °[facility]  (336) 832-8035   °[facility]    (336) 832-7272   °[facility] [address] °[place], [place] [address] (336) 832-4777   °[facility] [address], °[place] (336) 271-4999   °[facility]    (336) 373-0678   °[facility]    (336) 272-1050   °[facility] ° [address], [place] Phone:  (336) 832-4444, Fax:  (336) 832-4440 Hours of Operation:  9 am - 6 pm, M-F.  Also accepts Medicaid/Medicare and self-pay.  °[facility] ° [address], [place] Phone: (336) 832-3150, Fax: (336) 832-3151. Hours of Operation:  8:30 am - 5:30 pm, M-F.  Also accepts Medicaid and self-pay.  °[facility] [place] [address], [place] Phone: (336) 878-6027   °[facility] [address], [place], [place] (336)723-1848, Ext. 123 Mondays & Thursdays: 7-9 AM.  First 15 patients are seen on a first come, first serve basis. °  ° °Medicaid-accepting [place] Providers: ° °Organization         Address  Phone   Notes  °[facility] [address], [place] (336) 641-2100 Also accepts self-pay patients.  °[facility] [address], [place] ° (336) 856-9996   °[facility] [address], [place]  (336) 288-8857   °[facility] [address] [place] [address], [place] (336) 299-7000   °Veita Bland [address], [place]  ° (336) 373-1557 Only accepts [place] Access Medicaid patients after they have their name applied to their card.  ° °Self-Pay (no insurance) in [place]: ° °Organization         Address  Phone   Notes  °Sickle Cell Patients, [facility] [address], [place] (336) 832-1970   °[facility] [address], [place] (336) 832-4400   °[facility] ° [address], [place] (336) 992-4800   °[facility]/Dr. Osei-Bonsu ° [address] [place] [address], [place] or [address], [place] (336) 841-8500 Phone number for both [place] and [place] locations is the same.  °[facility] [address], [place] (336) 299-0000   °[facility] [address] [place] [address], [place] or [address] (336) 852-7530 °(336) 878-2260   °[facility] [address], [place] (336) 350-1642, phone; (336) 294-5005, fax Sees patients 1st and 3rd Saturday of every month.  Must not qualify for public or private insurance (i.e. Medicaid, Medicare, [place] Health Choice, Veterans' Benefits) • Household income should be no more than 200% of the poverty level •The clinic cannot treat you if you are pregnant or think you are pregnant • Sexually transmitted diseases are not treated at the clinic.  ° ° °Dental Care: °Organization         Address  Phone  Notes  °[place] [facility] [address], [place] (336) 641-6152 Accepts children up to age 21 who are enrolled in Medicaid or [place] Health Choice; pregnant women with a Medicaid card; and children who have applied for Medicaid or [place] Health Choice, but were declined, whose parents can pay a reduced fee at time of service.  °[place]  [facility] [place]  [address], [place] (336) 641-7733 Accepts children up to age 21 who are enrolled in Medicaid or [place] Health Choice; pregnant women with a Medicaid card; and children who have applied for Medicaid or [place] Health Choice, but were declined, whose parents can pay a reduced fee at time of service.  °[facility] ° [address], [place] (336) 641-4533 Patients are seen by appointment only. Walk-ins are not accepted. [facility] will see patients 18 years of age and older. °Monday - Tuesday (8am-5pm) °Most Wednesdays (8:30-5pm) °$30 per visit, cash only  °[facility] ° [address], [place] (336) 641-4533 Patients are seen by appointment only. Walk-ins are not accepted. [facility] will see patients 18 years of age and older. °One   Wednesday Evening (Monthly: Volunteer Based).  $30 per visit, cash only  °[facility]  (919) 537-3737 for adults; Children under age 4, call Graduate Pediatric Dentistry at (919) [phone]. Children aged 4-14, please call (919) [phone] to request a pediatric application. ° Dental services are provided in all areas of dental care including fillings, crowns and bridges, complete and partial dentures, implants, gum treatment, root canals, and extractions. Preventive care is also provided. Treatment is provided to both adults and children. °Patients are selected via a lottery and there is often a waiting list. °  °[facility] [address], °[place] ° (336) 763-8833 [email or web address] °  °[facility] [address], [place], [place] (336)723-1848, Ext. 123 Second and Fourth Thursday of each month, opens at 6:30 AM; Clinic ends at 9 AM.  Patients are seen on a first-come first-served basis, and a limited number are seen during each clinic.  ° °[facility] ° [address], [place], [place] (336) 723-7904    Eligibility Requirements °You must have lived in [place], [place], or [place] counties for at least the last three months. °  You cannot be eligible for state or federal sponsored healthcare insurance, including Veterans Administration, Medicaid, or Medicare. °  You generally cannot be eligible for healthcare insurance through your employer.  °  How to apply: °Eligibility screenings are held every Tuesday and Wednesday afternoon from 1:00 pm until 4:00 pm. You do not need an appointment for the interview!  °[facility] [address], [place], [place] 336-631-2330   °[facility]  336-342-8273   °[facility]  336-703-3100   °[facility]  336-570-6415   ° °Behavioral Health Resources in the Community: °Intensive Outpatient Programs °Organization         Address  Phone  Notes  °[facility] [address], [place], [place] 336-878-6098   °[facility] [address], [place], [place] 336-832-9800   °[facility] [address], [place], [place] ° 336-882-2125   °[facility] [address],  °[place], [place] 1-800-853-5163 or 336-641-4981   °Substance Abuse Resources °Organization         Address  Phone  Notes  °[facility]  336-882-2125   °[facility]  336-784-9470   °[facility]  336-285-9073   °[facility]  336-845-3988   °Residential & Outpatient Substance Abuse Program  1-800-659-3381   °Psychological Services °Organization         Address  Phone  Notes  °[facility]  336- 832-9600   °[facility]  336- 378-7881   °[facility] [address], [place] 1-800-853-5163 or 336-641-4981   ° °Mobile Crisis Teams °Organization         Address  Phone  Notes  °[facility], Mobile Crisis Care Unit  1-877-626-1772   °Assertive °[facility] ° [address]  [place], [place] 336-834-9664   °Sharon DeEsch [address] °[place] 336-554-5454   ° °Self-Help/Support Groups °Organization         Address  Phone             Notes  °[facility] [place] - variety of support groups  336- 373-1402 Call for more information  °Narcotics Anonymous (NA), [facility] [address], °[place]   2 meetings at this location  ° °  Residential Treatment Programs °Organization         Address  Phone  Notes  °[facility] [address],    °[place]  1-866-801-8205   °[facility] ° [address], [place], [place] 704-293-8524   °[facility] [address], [place] 336-845-3988 Admissions: 8am-3pm M-F  °[facility] [address]    °[place], [place] 336-841-1104   °[facility] [address], [place], [place] 336-379-7146   °[facility] [address]  °[place], [place] 336-285-9073   °[facility] - Intensive Outpatient [address], , [place] 336-852-3033   °[facility] [address]  °[place], [place] 1-877-615-2722 or 336-784-9470   °[facility] [address] [place], [place] 336-227-7417 Accepts Medicaid  °[facility] [address]  ° [place] 1-800-659-3381 Substance Abuse/Addiction Treatment  ° °[place] Behavioral Health Resources °Organization         Address  Phone  Notes  °[facility]  (888) 581-9988   °Julie Brannon, PhD [address] [place], [place]   (336) 349-5553 or (336) 951-0000   °[facility]   [address] °[place], [place] (336) 349-4454   °[facility] [address], [place], [place] (336) 342-8316 Insurance/Medicaid/sponsorship through [facility]  °[facility] [address]                                    [place],  (336) 342-8316 Therapy/tele-psych/case    °[facility] [address]  ° [place],  (336) 349-2233    °Dr. Arfeen  (336) 349-4544   °[facility] [place]  [employer] [place] [facility] 1) [address], [place] °2) [address], [place] °3)  [address], [place] (336) 349-3220 °(336) 342-7768 ° °(336) 342-8140   °[place] [facility] (336) 342-1394 or (336) 342-3537 (After Hours)    ° ° °
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
## 2013-12-28 NOTE — ED Provider Notes (Signed)
Pt alert, content. Mildly anxious.  Pt had requested of nurse d/c to home. I rechecked pt, explained that psych team was looking for rehab/detox programs for her.  Pt states she wants to leave, requests d/c.  States she will pursue programs on her own, as outpatient. Pt denies feeling severely depressed. Denies any thoughts of harm to self or others.  Continues to request d/c.   Will provide resource guide. Pt currently appears stable for d/c.     Suzi RootsKevin E Alauna Hayden, MD 12/28/13 [id number]

## 2013-12-30 NOTE — ED Provider Notes (Signed)
Medical screening examination/treatment/procedure(s) were performed by non-physician practitioner and as supervising physician I was immediately available for consultation/collaboration.   EKG Interpretation None        Candyce ChurnJohn David Matilde Pottenger III, MD 12/30/13 1630

## 2014-05-16 ENCOUNTER — Encounter (HOSPITAL_COMMUNITY): Payer: Self-pay | Admitting: Emergency Medicine

## 2014-05-16 ENCOUNTER — Emergency Department (HOSPITAL_COMMUNITY)
Admission: EM | Admit: 2014-05-16 | Discharge: 2014-05-19 | Disposition: A | Discharge status: Other Acute Inpatient Hospital | Payer: No Typology Code available for payment source | Attending: Emergency Medicine | Admitting: Emergency Medicine

## 2014-05-16 DIAGNOSIS — R000 Tachycardia, unspecified: Secondary | ICD-10-CM | POA: Insufficient documentation

## 2014-05-16 DIAGNOSIS — Z3202 Encounter for pregnancy test, result negative: Secondary | ICD-10-CM | POA: Insufficient documentation

## 2014-05-16 DIAGNOSIS — F1210 Cannabis abuse, uncomplicated: Secondary | ICD-10-CM | POA: Insufficient documentation

## 2014-05-16 DIAGNOSIS — F1994 Other psychoactive substance use, unspecified with psychoactive substance-induced mood disorder: Secondary | ICD-10-CM | POA: Diagnosis present

## 2014-05-16 DIAGNOSIS — F1910 Other psychoactive substance abuse, uncomplicated: Secondary | ICD-10-CM

## 2014-05-16 DIAGNOSIS — F411 Generalized anxiety disorder: Secondary | ICD-10-CM

## 2014-05-16 DIAGNOSIS — R4589 Other symptoms and signs involving emotional state: Secondary | ICD-10-CM

## 2014-05-16 DIAGNOSIS — Z791 Long term (current) use of non-steroidal anti-inflammatories (NSAID): Secondary | ICD-10-CM | POA: Insufficient documentation

## 2014-05-16 DIAGNOSIS — Z8659 Personal history of other mental and behavioral disorders: Secondary | ICD-10-CM | POA: Insufficient documentation

## 2014-05-16 DIAGNOSIS — R45851 Suicidal ideations: Secondary | ICD-10-CM | POA: Insufficient documentation

## 2014-05-16 DIAGNOSIS — R443 Hallucinations, unspecified: Secondary | ICD-10-CM | POA: Insufficient documentation

## 2014-05-16 DIAGNOSIS — R4689 Other symptoms and signs involving appearance and behavior: Secondary | ICD-10-CM

## 2014-05-16 DIAGNOSIS — F17200 Nicotine dependence, unspecified, uncomplicated: Secondary | ICD-10-CM | POA: Insufficient documentation

## 2014-05-16 DIAGNOSIS — F1320 Sedative, hypnotic or anxiolytic dependence, uncomplicated: Secondary | ICD-10-CM

## 2014-05-16 DIAGNOSIS — F10229 Alcohol dependence with intoxication, unspecified: Secondary | ICD-10-CM

## 2014-05-16 DIAGNOSIS — F319 Bipolar disorder, unspecified: Secondary | ICD-10-CM

## 2014-05-16 LAB — COMPREHENSIVE METABOLIC PANEL
ALT: 10 U/L (ref 0–35)
AST: 22 U/L (ref 0–37)
Albumin: 3.6 g/dL (ref 3.5–5.2)
Alkaline Phosphatase: 81 U/L (ref 39–117)
Anion gap: 10 (ref 5–15)
BUN: 14 mg/dL (ref 6–23)
CALCIUM: 8.6 mg/dL (ref 8.4–10.5)
CO2: 26 meq/L (ref 19–32)
CREATININE: 0.77 mg/dL (ref 0.50–1.10)
Chloride: 104 mEq/L (ref 96–112)
GFR calc Af Amer: >90 mL/min (ref >90)
Glucose, Bld: 78 mg/dL (ref 70–99)
Potassium: 4.2 mEq/L (ref 3.7–5.3)
Sodium: 140 mEq/L (ref 137–147)
Total Bilirubin: <0.2 mg/dL — ABNORMAL LOW (ref 0.3–1.2)
Total Protein: 6.9 g/dL (ref 6.0–8.3)

## 2014-05-16 LAB — CBC
HEMATOCRIT: 39.1 % (ref 36.0–46.0)
HEMOGLOBIN: 13.9 g/dL (ref 12.0–15.0)
MCH: 32.7 pg (ref 26.0–34.0)
MCHC: 35.5 g/dL (ref 30.0–36.0)
MCV: 92 fL (ref 78.0–100.0)
Platelets: 196 10*3/uL (ref 150–400)
RBC: 4.25 MIL/uL (ref 3.87–5.11)
RDW: 11.6 % (ref 11.5–15.5)
WBC: 6.6 10*3/uL (ref 4.0–10.5)

## 2014-05-16 LAB — SALICYLATE LEVEL

## 2014-05-16 LAB — ACETAMINOPHEN LEVEL: Acetaminophen (Tylenol), Serum: <15 ug/mL (ref 10–30)

## 2014-05-16 LAB — ETHANOL: ALCOHOL ETHYL (B): 141 mg/dL — AB (ref 0–11)

## 2014-05-16 MED ORDER — THIAMINE HCL 100 MG/ML IJ SOLN: 100.0000 mg | Freq: Every day | INTRAMUSCULAR | Status: DC

## 2014-05-16 MED ORDER — ONDANSETRON HCL 4 MG PO TABS
4.0000 mg | ORAL_TABLET | Freq: Three times a day (TID) | ORAL | Status: DC | PRN
  Administered 2014-05-17: 4 mg via ORAL
  Filled 2014-05-16: qty 1

## 2014-05-16 MED ORDER — VITAMIN B-1 100 MG PO TABS
100.0000 mg | ORAL_TABLET | Freq: Every day | ORAL | Status: DC
  Administered 2014-05-17 – 2014-05-18 (×3): 100 mg via ORAL
  Filled 2014-05-16 (×3): qty 1

## 2014-05-16 MED ORDER — NICOTINE 21 MG/24HR TD PT24
21.0000 mg | MEDICATED_PATCH | Freq: Every day | TRANSDERMAL | Status: DC
Start: 2014-05-16 — End: 2014-05-19
  Administered 2014-05-17 – 2014-05-19 (×3): 21 mg via TRANSDERMAL
  Filled 2014-05-16 (×2): qty 1

## 2014-05-16 MED ORDER — LORAZEPAM 1 MG PO TABS
0.0000 mg | ORAL_TABLET | Freq: Four times a day (QID) | ORAL | Status: DC
  Administered 2014-05-17: 1 mg via ORAL
  Administered 2014-05-17: 2 mg via ORAL
  Administered 2014-05-17 – 2014-05-18 (×2): 1 mg via ORAL
  Administered 2014-05-18: 2 mg via ORAL
  Administered 2014-05-18: 1 mg via ORAL
  Filled 2014-05-16: qty 2
  Filled 2014-05-16 (×3): qty 1
  Filled 2014-05-16 (×2): qty 2

## 2014-05-16 MED ORDER — LORAZEPAM 1 MG PO TABS
0.0000 mg | ORAL_TABLET | Freq: Two times a day (BID) | ORAL | Status: DC
  Filled 2014-05-16: qty 1

## 2014-05-16 MED ORDER — IBUPROFEN 200 MG PO TABS
600.0000 mg | ORAL_TABLET | Freq: Three times a day (TID) | ORAL | Status: DC | PRN
  Administered 2014-05-18: 600 mg via ORAL
  Filled 2014-05-16: qty 3

## 2014-05-16 MED ORDER — ACETAMINOPHEN 325 MG PO TABS
650.0000 mg | ORAL_TABLET | ORAL | Status: DC | PRN
  Administered 2014-05-17 – 2014-05-18 (×4): 650 mg via ORAL
  Filled 2014-05-16 (×4): qty 2

## 2014-05-16 MED ORDER — ZOLPIDEM TARTRATE 5 MG PO TABS
5.0000 mg | ORAL_TABLET | Freq: Every evening | ORAL | Status: DC | PRN
  Administered 2014-05-17 – 2014-05-18 (×2): 5 mg via ORAL
  Filled 2014-05-16 (×2): qty 1

## 2014-05-16 MED ORDER — ALUM & MAG HYDROXIDE-SIMETH 200-200-20 MG/5ML PO SUSP
30.0000 mL | ORAL | Status: DC | PRN
  Administered 2014-05-18 – 2014-05-19 (×2): 30 mL via ORAL
  Filled 2014-05-16 (×2): qty 30

## 2014-05-16 NOTE — ED Provider Notes (Signed)
CSN: [id number]     Arrival date & time 05/16/14  2031 History  This chart was scribed for non-physician practitioner, Fayrene HelperBowie Stevenson Windmiller, PA-C,working with Purvis SheffieldForrest Harrison, MD, by Karle PlumberJennifer Tensley, ED Scribe. This patient was seen in room WTR3/WLPT3 and the patient's care was started at 9:52 PM.  Chief Complaint  Patient presents with  . Alcohol Intoxication   HPI HPI Comments:  Rita Carey is a 33 y.o. female who presents to the Emergency Department wanting detox from Diazepam and alcohol. She states she has been using for three months. She states [facility] months [facility] and was released because she knew she would fail the drug test secondary to smoking marijuana. She reports associated hallucinations when she does not sleep for days at a time. She states she usually drinks about a pint of alcohol a day with the last drink earlier today. She denies SI, HI, fever, chills, nausea or vomiting.  Pt later report she has been feeling as if she no longer cared about living.  She has been involving in risky behaviors such as driving while intoxicated, walking out in the middle of the street, and states "i wouldn't care if i died".    Past Medical History  Diagnosis Date  . Insomnia   . Anxiety   . Anxiety   . Depression   . Polysubstance abuse    Past Surgical History  Procedure Laterality Date  . No past surgeries     No family history on file. History  Substance Use Topics  . Smoking status: Current Every Day Smoker -- 1.00 packs/day for 11 years    Types: Cigarettes  . Smokeless tobacco: Never Used  . Alcohol Use: 10.5 oz/week    21 drink(s) per week     Comment: 3 cans of high gravity   OB History   Grav Para Term Preterm Abortions TAB SAB Ect Mult Living                 Review of Systems  Constitutional: Negative for fever and chills.  Gastrointestinal: Negative for nausea and vomiting.  Skin: Negative for rash.  Psychiatric/Behavioral: Positive for  hallucinations. Negative for self-injury.      Allergies  Review of patient's allergies indicates no known allergies.  Home Medications   Prior to Admission medications   Medication Sig Start Date End Date Taking? Authorizing Provider  naproxen sodium (ANAPROX) 220 MG tablet Take 440 mg by mouth daily as needed (pain).   Yes Historical Provider, MD   Triage Vitals: BP 129/77  Pulse 101  Temp(Src) 98.2 F (36.8 C) (Oral)  Resp 20  SpO2 99%  LMP 04/25/2014 Physical Exam  Nursing note and vitals reviewed. Constitutional: She is oriented to person, place, and time. She appears well-developed and well-nourished.  HENT:  Head: Normocephalic and atraumatic.  Eyes: EOM are normal.  Neck: Normal range of motion.  Cardiovascular: Tachycardia present.  Exam reveals no gallop and no friction rub.   No murmur heard. Pulmonary/Chest: Effort normal and breath sounds normal. No respiratory distress. She has no wheezes. She has no rales.  Musculoskeletal: Normal range of motion.  Neurological: She is alert and oriented to person, place, and time.  Skin: Skin is warm and dry.  Psychiatric: She has a normal mood and affect. Her behavior is normal. Thought content normal. Her speech is rapid and/or pressured.  [place]    ED Course  Procedures (including critical care time) DIAGNOSTIC STUDIES: Oxygen Saturation  is 99% on RA, normal by my interpretation.   COORDINATION OF CARE: 9:56 PM- Will order standard medical clearance labs. Pt verbalizes understanding and agrees to plan.  10:55 PM- Pt asks to see provider again and admits to not caring if she lives or dies. She admits to partaking in risky behaviors such as walking out in front of traffic and driving while intoxicated. She requests help secondary to being worried that she may harm herself.   Medications  acetaminophen (TYLENOL) tablet 650 mg (not administered)  ibuprofen (ADVIL,MOTRIN) tablet 600 mg (not administered)  zolpidem  (AMBIEN) tablet 5 mg (not administered)  nicotine (NICODERM CQ - dosed in mg/24 hours) patch 21 mg (not administered)  ondansetron (ZOFRAN) tablet 4 mg (not administered)  alum & mag hydroxide-simeth (MAALOX/MYLANTA) 200-200-20 MG/5ML suspension 30 mL (not administered)  LORazepam (ATIVAN) tablet 0-4 mg (not administered)    Followed by  LORazepam (ATIVAN) tablet 0-4 mg (not administered)  thiamine (VITAMIN B-1) tablet 100 mg (not administered)    Or  thiamine (B-1) injection 100 mg (not administered)    Labs Review Labs Reviewed  COMPREHENSIVE METABOLIC PANEL - Abnormal; Notable for the following:    Total Bilirubin <0.2 (*)    All other components within normal limits  ETHANOL - Abnormal; Notable for the following:    Alcohol, Ethyl (B) 141 (*)    All other components within normal limits  SALICYLATE LEVEL - Abnormal; Notable for the following:    Salicylate Lvl <2.0 (*)    All other components within normal limits  ACETAMINOPHEN LEVEL  CBC  URINE RAPID DRUG SCREEN (HOSP PERFORMED)  POC URINE PREG, ED    Imaging Review No results found.   EKG Interpretation None      MDM   Final diagnoses:  Polysubstance abuse  Suicidal behavior    BP 129/77  Pulse 101  Temp(Src) 98.2 F (36.8 C) (Oral)  Resp 20  SpO2 99%  LMP 04/25/2014   I personally performed the services described in this documentation, which was scribed in my presence. The recorded information has been reviewed and is accurate.    Fayrene Helper, PA-C 05/17/14 0020

## 2014-05-16 NOTE — ED Notes (Signed)
Pt requesting detox from etoh and benzos.  Pt reports last drink was today.  Normally drinks a pint a day.  Pt reports pain in her legs, denies any injury.  States "i think it's from the benzo withdrawal."  Pt appears tense and anxious.

## 2014-05-17 ENCOUNTER — Encounter (HOSPITAL_COMMUNITY): Payer: Self-pay | Admitting: Psychiatry

## 2014-05-17 DIAGNOSIS — F411 Generalized anxiety disorder: Secondary | ICD-10-CM | POA: Diagnosis present

## 2014-05-17 DIAGNOSIS — F1010 Alcohol abuse, uncomplicated: Secondary | ICD-10-CM

## 2014-05-17 DIAGNOSIS — F10229 Alcohol dependence with intoxication, unspecified: Secondary | ICD-10-CM | POA: Diagnosis present

## 2014-05-17 DIAGNOSIS — F1320 Sedative, hypnotic or anxiolytic dependence, uncomplicated: Secondary | ICD-10-CM | POA: Diagnosis present

## 2014-05-17 DIAGNOSIS — R45851 Suicidal ideations: Secondary | ICD-10-CM

## 2014-05-17 DIAGNOSIS — F319 Bipolar disorder, unspecified: Secondary | ICD-10-CM | POA: Diagnosis present

## 2014-05-17 DIAGNOSIS — F3130 Bipolar disorder, current episode depressed, mild or moderate severity, unspecified: Secondary | ICD-10-CM

## 2014-05-17 DIAGNOSIS — F1910 Other psychoactive substance abuse, uncomplicated: Secondary | ICD-10-CM

## 2014-05-17 LAB — RAPID URINE DRUG SCREEN, HOSP PERFORMED
AMPHETAMINES: NOT DETECTED
Barbiturates: NOT DETECTED
Benzodiazepines: POSITIVE — AB
Cocaine: NOT DETECTED
Opiates: NOT DETECTED
Tetrahydrocannabinol: POSITIVE — AB

## 2014-05-17 LAB — POC URINE PREG, ED: PREG TEST UR: NEGATIVE

## 2014-05-17 MED ORDER — METHOCARBAMOL 500 MG PO TABS
500.0000 mg | ORAL_TABLET | Freq: Three times a day (TID) | ORAL | Status: DC | PRN
  Administered 2014-05-18: 500 mg via ORAL
  Filled 2014-05-17: qty 1

## 2014-05-17 MED ORDER — CLONIDINE HCL 0.1 MG PO TABS: 0.1000 mg | ORAL_TABLET | ORAL | Status: DC

## 2014-05-17 MED ORDER — HYDROXYZINE HCL 25 MG PO TABS
25.0000 mg | ORAL_TABLET | Freq: Four times a day (QID) | ORAL | Status: DC | PRN
  Filled 2014-05-17 (×2): qty 1

## 2014-05-17 MED ORDER — CLONIDINE HCL 0.1 MG PO TABS
0.1000 mg | ORAL_TABLET | Freq: Four times a day (QID) | ORAL | Status: DC
  Administered 2014-05-17 – 2014-05-18 (×3): 0.1 mg via ORAL
  Filled 2014-05-17 (×3): qty 1

## 2014-05-17 MED ORDER — METHOCARBAMOL 500 MG PO TABS
500.0000 mg | ORAL_TABLET | Freq: Two times a day (BID) | ORAL | Status: DC | PRN
  Administered 2014-05-17: 500 mg via ORAL
  Filled 2014-05-17: qty 1

## 2014-05-17 MED ORDER — CLONIDINE HCL 0.1 MG PO TABS: 0.1000 mg | ORAL_TABLET | Freq: Every day | ORAL | Status: DC

## 2014-05-17 MED ORDER — HYDROXYZINE HCL 25 MG PO TABS: 25.0000 mg | ORAL_TABLET | Freq: Four times a day (QID) | ORAL | Status: DC | PRN

## 2014-05-17 MED ORDER — ONDANSETRON 4 MG PO TBDP: 4.0000 mg | ORAL_TABLET | Freq: Four times a day (QID) | ORAL | Status: DC | PRN

## 2014-05-17 MED ORDER — LOPERAMIDE HCL 2 MG PO CAPS: 2.0000 mg | ORAL_CAPSULE | ORAL | Status: DC | PRN

## 2014-05-17 MED ORDER — DICYCLOMINE HCL 20 MG PO TABS: 20.0000 mg | ORAL_TABLET | Freq: Four times a day (QID) | ORAL | Status: DC | PRN

## 2014-05-17 MED ORDER — NAPROXEN 500 MG PO TABS
500.0000 mg | ORAL_TABLET | Freq: Two times a day (BID) | ORAL | Status: DC | PRN
Start: 2014-05-17 — End: 2014-05-18

## 2014-05-17 MED ORDER — DIVALPROEX SODIUM 250 MG PO DR TAB
250.0000 mg | DELAYED_RELEASE_TABLET | Freq: Two times a day (BID) | ORAL | Status: DC
  Administered 2014-05-17 – 2014-05-19 (×4): 250 mg via ORAL
  Filled 2014-05-17 (×4): qty 1

## 2014-05-17 NOTE — BH Assessment (Signed)
Assessment Note  Rita Carey is an 33 y.o. female.  The PT reported requesting her Roommate drive her to the ED.  PT reported experiencing SI w/ no plan on 05-15-14 and denied current SI.  PT denied past or current HI, AH, and VH.  PT reported engaging in risky behavior of riding her scooter w/no lights on on  darken road, b/c "I just don't care."  PT reported experiencing depressive sx of sleeping 3 hrs per night, no appetite and unintentional weight loss 22 lbs, anger and property destruction, fatigue, and crying episodes.  PT reported sx start in March 2014 when her Mother was dying and died in 2014-05-19until the present.  PT reported daily ETOH consumption of a pt of vodka, buying and using benzos off the street (Diazepam), and THC.  She reported last ETOH, Diazepam, and THC as yesterday.  PT reported receiving med management at [facility] for PTSD and GAD.  She reported last seen at [facility] over a 8 mos ago after she refused a UDS, b/c she would have been positive for THC.  PT reported current lacareny charges and a pending court date on 05-19-14.  PT requested MH tx and feels SA issues will be taken care of if MH issues are treated.  PT reported [facility] admittance in 2013/02/25 for Opiate w/d sxs.  She reported previous Methadone tx.       Axis I: Depressive Disorder NOS and Alcohol Use Disorder, moderate, Canabis Use Disorder, mild,  Axis II: Deferred Axis IV: economic problems, occupational problems, problems related to legal system/crime and problems with primary support group Axis V: 31-40 impairment in reality testing  Past Medical History:  Past Medical History  Diagnosis Date  . Insomnia   . Anxiety   . Anxiety   . Depression   . Polysubstance abuse     Past Surgical History  Procedure Laterality Date  . No past surgeries      Family History: No family history on file.  Social History:  reports that she has been smoking Cigarettes.  She has a 11 pack-year smoking history. She has never  used smokeless tobacco. She reports that she drinks about 10.5 ounces of alcohol per week. She reports that she uses illicit drugs (Marijuana, Oxycodone, and Heroin).  Additional Social History:     CIWA: CIWA-Ar BP: 121/78 mmHg Pulse Rate: 72 Nausea and Vomiting: 3 Tactile Disturbances: mild itching, pins and needles, burning or numbness Tremor: two Auditory Disturbances: very mild harshness or ability to frighten Paroxysmal Sweats: two Visual Disturbances: very mild sensitivity Anxiety: two Headache, Fullness in Head: mild Agitation: normal activity Orientation and Clouding of Sensorium: oriented and can do serial additions CIWA-Ar Total: 15 COWS:    Allergies: No Known Allergies  Home Medications:  (Not in a hospital admission)  OB/GYN Status:  Patient's last menstrual period was 04/25/2014.  General Assessment Data [facility] of Assessment: [facility] ACT Assessment: Yes Is this a Tele or Face-to-Face Assessment?: Face-to-Face Is this an Initial Assessment or a Re-assessment for this encounter?: Initial Assessment Living Arrangements: Other (Comment) (Roommate) Can pt return to current living arrangement?: Yes Admission Status: Voluntary Is patient capable of signing voluntary admission?: Yes Transfer from: Home Referral Source: Self/Family/Friend  Medical Screening Exam [facility]) Medical Exam completed: No Reason for MSE not completed: Other:  [facility] Crisis Care Plan Living Arrangements: Other (Comment) (Roommate) Name of Psychiatrist: None Name of Therapist: None  Education Status Is patient currently in school?: No Current  Grade: N/A Highest grade of school patient has completed: N/A Name of school: N/A Contact person: N/A  Risk to self with the past 6 months Suicidal Ideation: No-Not Currently/Within Last 6 Months Suicidal Intent: No Is patient at risk for suicide?: No Suicidal Plan?: No Access to Means: No What has been your use of drugs/alcohol  within the last 12 months?: ETOH, Benzo, THC Previous Attempts/Gestures: No How many times?: 0 Other Self Harm Risks: Risky behaviors (Riding scooter w/o lights on dark road) Triggers for Past Attempts: None known Intentional Self Injurious Behavior: None Family Suicide History: Unknown Recent stressful life event(s): Loss (Comment);Financial Problems (Mother death in 2014,  estranged from Father & siblings) Persecutory voices/beliefs?: No Depression: Yes Depression Symptoms: Insomnia;Tearfulness;Isolating;Fatigue;Feeling angry/irritable;Feeling worthless/self pity;Despondent Substance abuse history and/or treatment for substance abuse?: Yes Suicide prevention information given to non-admitted patients: Not applicable  Risk to Others within the past 6 months Homicidal Ideation: No Thoughts of Harm to Others: No Current Homicidal Intent: No Current Homicidal Plan: No Access to Homicidal Means: No Identified Victim: N/A History of harm to others?: No Assessment of Violence: None Noted Violent Behavior Description: N/A Does patient have access to weapons?: No Criminal Charges Pending?: Yes Describe Pending Criminal Charges: Lacareny (PT reported shop lifting ETOH) Does patient have a court date: Yes Court Date: 05/19/14  Psychosis Hallucinations: None noted Delusions: None noted  Mental Status Report Appear/Hygiene: In hospital gown Eye Contact: Poor Motor Activity: Restlessness Speech: Logical/coherent Level of Consciousness: Alert Mood: Depressed;Anxious Affect: Depressed;Anxious Anxiety Level: Severe Thought Processes: Coherent Judgement: Impaired Orientation: Person;Place;Time;Situation Obsessive Compulsive Thoughts/Behaviors: None  Cognitive Functioning Concentration: Decreased Memory: Recent Intact;Remote Intact IQ: Average Insight: Poor Impulse Control: Poor Appetite: Poor Weight Loss: 22 Weight Gain: 0 Sleep: Decreased Total Hours of Sleep: 3 Vegetative  Symptoms: None  ADLScreening [facility] Assessment Services) Patient's cognitive ability adequate to safely complete daily activities?: Yes Patient able to express need for assistance with ADLs?: Yes Independently performs ADLs?: Yes (appropriate for developmental age)  Prior Inpatient Therapy Prior Inpatient Therapy: Yes Prior Therapy Dates: 2014 [facility] Reason for Treatment: Opiate W/D  Prior Outpatient Therapy Prior Outpatient Therapy: Yes Prior Therapy Dates: 2015 Prior Therapy Facilty/Provider(s): [facility] Reason for Treatment: PTSD, GAD  ADL Screening (condition at time of admission) Patient's cognitive ability adequate to safely complete daily activities?: Yes Patient able to express need for assistance with ADLs?: Yes Independently performs ADLs?: Yes (appropriate for developmental age)         Values / Beliefs Cultural Requests During Hospitalization: None Spiritual Requests During Hospitalization: None        Additional Information 1:1 In Past 12 Months?: No CIRT Risk: No Elopement Risk: No Does patient have medical clearance?: No     Disposition:  Disposition Initial Assessment Completed for this Encounter: Yes Disposition of Patient: Inpatient treatment program Type of inpatient treatment program: Adult  On Site Evaluation by:   Reviewed with Physician:    Dey-Johnson,Elexia Friedt 05/17/2014 11:54 AM

## 2014-05-17 NOTE — ED Notes (Signed)
Patient stated desire is for benzo detox.  Is requesting

## 2014-05-17 NOTE — Progress Notes (Addendum)
1640- [facility].  Information given; running pt by psychiatrist and will call TTS once review of referral complete.  1800- per Deedra, pt had been accepted but then stated that d/t pt's upcoming court date on the 10th could not take for inpt admission because she would not be detoxed before that date.    Tomi BambergerMariya Jaryd Drew Disposition MHT

## 2014-05-17 NOTE — ED Notes (Signed)
TTS assessment to be completed after 0700 hrs

## 2014-05-17 NOTE — Consult Note (Signed)
[facility] Face-to-Face Psychiatry Consult   Reason for Consult:  Alcohol/benzodiazepine detox/dependence Referring Physician:  EDP  Rita Carey is an 33 y.o. female. Total Time spent with patient: 20 minutes  Assessment: AXIS I:  Alcohol Abuse, Bipolar, Depressed, Generalized Anxiety Disorder and Substance Abuse AXIS II:  Deferred AXIS III:   Past Medical History  Diagnosis Date  . Insomnia   . Anxiety   . Anxiety   . Depression   . Polysubstance abuse    AXIS IV:  economic problems, occupational problems, other psychosocial or environmental problems, problems related to social environment and problems with primary support group AXIS V:  21-30 behavior considerably influenced by delusions or hallucinations OR serious impairment in judgment, communication OR inability to function in almost all areas  Plan:  Recommend psychiatric Inpatient admission when medically cleared.  Dr. Adele Schilder assessed the patient and concurs with the plan.  Subjective:   Rita Carey is a 33 y.o. female patient admitted with alcohol/benzodiazepine detox/dependence.  HPI:  Patient has been drinking a little over a pint daily for the past two years.  Her mother died last year and she feels she has lost everything, hopeless, no ambition, not taking care of her ADLs, sleeping or lying in the bed all day.  She has been seen at [facility] in the past but states she "showed out" and cannot be seen there anymore, anger issues voiced.  Daphne x one when her mother died for substance abuse and depression.  She states Wellbutrin, Paxil, Zoloft have not been effective.  Seroquel caused her to have "muscle stiffness."  Agreeable to Depakote, depressed at this time and willing to try anything to help.  She also wants alcohol/benzodiazepine detox, states she has been on Xanax or other benzodiazepines for 12 years.  Coarse tremors with  Muscle aches on assessment. HPI Elements:   Location:  generalized. Quality:  acute. Severity:   severe. Timing:  constant. Duration:  few weeks. Context:  death of her mother, increase in drinking.  Past Psychiatric History: Past Medical History  Diagnosis Date  . Insomnia   . Anxiety   . Anxiety   . Depression   . Polysubstance abuse     reports that she has been smoking Cigarettes.  She has a 11 pack-year smoking history. She has never used smokeless tobacco. She reports that she drinks about 10.5 ounces of alcohol per week. She reports that she uses illicit drugs (Marijuana, Oxycodone, and Heroin). History reviewed. No pertinent family history. Family History Substance Abuse: Yes, Describe: (ETOH, Benzo, THC) Family Supports: No Living Arrangements: Other (Comment) (Roommate) Can pt return to current living arrangement?: Yes   Allergies:  No Known Allergies  ACT Assessment Complete:  Yes:    Educational Status    Risk to Self: Risk to self with the past 6 months Suicidal Ideation: No-Not Currently/Within Last 6 Months Suicidal Intent: No Is patient at risk for suicide?: No Suicidal Plan?: No Access to Means: No What has been your use of drugs/alcohol within the last 12 months?: ETOH, Benzo, THC Previous Attempts/Gestures: No How many times?: 0 Other Self Harm Risks: Risky behaviors (Riding scooter w/o lights on dark road) Triggers for Past Attempts: None known Intentional Self Injurious Behavior: None Family Suicide History: Unknown Recent stressful life event(s): Loss (Comment);Financial Problems (Mother death in 2012-12-10,  estranged from Father & siblings) Persecutory voices/beliefs?: No Depression: Yes Depression Symptoms: Insomnia;Tearfulness;Isolating;Fatigue;Feeling angry/irritable;Feeling worthless/self pity;Despondent Substance abuse history and/or treatment for substance abuse?: Yes Suicide prevention information  given to non-admitted patients: Not applicable  Risk to Others: Risk to Others within the past 6 months Homicidal Ideation: No Thoughts of Harm  to Others: No Current Homicidal Intent: No Current Homicidal Plan: No Access to Homicidal Means: No Identified Victim: N/A History of harm to others?: No Assessment of Violence: None Noted Violent Behavior Description: N/A Does patient have access to weapons?: No Criminal Charges Pending?: Yes Describe Pending Criminal Charges: Lacareny (PT reported shop lifting ETOH) Does patient have a court date: Yes Court Date: 05/19/14  Abuse:    Prior Inpatient Therapy: Prior Inpatient Therapy Prior Inpatient Therapy: Yes Prior Therapy Dates: 2014 Prior Therapy Facilty/Provider(s): [facility] Reason for Treatment: Opiate W/D  Prior Outpatient Therapy: Prior Outpatient Therapy Prior Outpatient Therapy: Yes Prior Therapy Dates: 2015 Prior Therapy Facilty/Provider(s): [facility] Reason for Treatment: PTSD, GAD  Additional Information: Additional Information 1:1 In Past 12 Months?: No CIRT Risk: No Elopement Risk: No Does patient have medical clearance?: No                  Objective: Blood pressure 113/66, pulse 69, temperature 98.7 F (37.1 C), temperature source Oral, resp. rate 20, last menstrual period 04/25/2014, SpO2 99.00%.There is no weight on file to calculate BMI. Results for orders placed during the hospital encounter of 05/16/14 (from the past 72 hour(s))  ACETAMINOPHEN LEVEL     Status: None   Collection Time    05/16/14  9:35 PM      Result Value Ref Range   Acetaminophen (Tylenol), Serum <15.0  10 - 30 ug/mL   Comment:            THERAPEUTIC CONCENTRATIONS VARY     SIGNIFICANTLY. A RANGE OF 10-30     ug/mL MAY BE AN EFFECTIVE     CONCENTRATION FOR MANY PATIENTS.     HOWEVER, SOME ARE BEST TREATED     AT CONCENTRATIONS OUTSIDE THIS     RANGE.     ACETAMINOPHEN CONCENTRATIONS     >150 ug/mL AT 4 HOURS AFTER     INGESTION AND >50 ug/mL AT 12     HOURS AFTER INGESTION ARE     OFTEN ASSOCIATED WITH TOXIC     REACTIONS.  CBC     Status: None   Collection Time     05/16/14  9:35 PM      Result Value Ref Range   WBC 6.6  4.0 - 10.5 K/uL   RBC 4.25  3.87 - 5.11 MIL/uL   Hemoglobin 13.9  12.0 - 15.0 g/dL   HCT 39.1  36.0 - 46.0 %   MCV 92.0  78.0 - 100.0 fL   MCH 32.7  26.0 - 34.0 pg   MCHC 35.5  30.0 - 36.0 g/dL   RDW 11.6  11.5 - 15.5 %   Platelets 196  150 - 400 K/uL  COMPREHENSIVE METABOLIC PANEL     Status: Abnormal   Collection Time    05/16/14  9:35 PM      Result Value Ref Range   Sodium 140  137 - 147 mEq/L   Potassium 4.2  3.7 - 5.3 mEq/L   Chloride 104  96 - 112 mEq/L   CO2 26  19 - 32 mEq/L   Glucose, Bld 78  70 - 99 mg/dL   BUN 14  6 - 23 mg/dL   Creatinine, Ser 0.77  0.50 - 1.10 mg/dL   Calcium 8.6  8.4 - 10.5 mg/dL   Total Protein 6.9  6.0 - 8.3 g/dL   Albumin 3.6  3.5 - 5.2 g/dL   AST 22  0 - 37 U/L   ALT 10  0 - 35 U/L   Alkaline Phosphatase 81  39 - 117 U/L   Total Bilirubin <0.2 (*) 0.3 - 1.2 mg/dL   GFR calc non Af Amer >90  >90 mL/min   GFR calc Af Amer >90  >90 mL/min   Comment: (NOTE)     The eGFR has been calculated using the CKD EPI equation.     This calculation has not been validated in all clinical situations.     eGFR's persistently <90 mL/min signify possible Chronic Kidney     Disease.   Anion gap 10  5 - 15  ETHANOL     Status: Abnormal   Collection Time    05/16/14  9:35 PM      Result Value Ref Range   Alcohol, Ethyl (B) 141 (*) 0 - 11 mg/dL   Comment:            LOWEST DETECTABLE LIMIT FOR     SERUM ALCOHOL IS 11 mg/dL     FOR MEDICAL PURPOSES ONLY  SALICYLATE LEVEL     Status: Abnormal   Collection Time    05/16/14  9:35 PM      Result Value Ref Range   Salicylate Lvl <1.5 (*) 2.8 - 20.0 mg/dL  URINE RAPID DRUG SCREEN (HOSP PERFORMED)     Status: Abnormal   Collection Time    05/17/14 12:24 AM      Result Value Ref Range   Opiates NONE DETECTED  NONE DETECTED   Cocaine NONE DETECTED  NONE DETECTED   Benzodiazepines POSITIVE (*) NONE DETECTED   Amphetamines NONE DETECTED  NONE DETECTED    Tetrahydrocannabinol POSITIVE (*) NONE DETECTED   Barbiturates NONE DETECTED  NONE DETECTED   Comment:            DRUG SCREEN FOR MEDICAL PURPOSES     ONLY.  IF CONFIRMATION IS NEEDED     FOR ANY PURPOSE, NOTIFY LAB     WITHIN 5 DAYS.                LOWEST DETECTABLE LIMITS     FOR URINE DRUG SCREEN     Drug Class       Cutoff (ng/mL)     Amphetamine      1000     Barbiturate      200     Benzodiazepine   615     Tricyclics       379     Opiates          300     Cocaine          300     THC              50  POC URINE PREG, ED     Status: None   Collection Time    05/17/14 12:33 AM      Result Value Ref Range   Preg Test, Ur NEGATIVE  NEGATIVE   Comment:            THE SENSITIVITY OF THIS     METHODOLOGY IS >24 mIU/mL   Labs are reviewed and are pertinent for no medical issues noted.  Current Facility-Administered Medications  Medication Dose Route Frequency Provider Last Rate Last Dose  . acetaminophen (TYLENOL) tablet 650 mg  650 mg Oral  Q4H PRN Domenic Moras, PA-C   650 mg at 05/17/14 1049  . alum & mag hydroxide-simeth (MAALOX/MYLANTA) 200-200-20 MG/5ML suspension 30 mL  30 mL Oral PRN Domenic Moras, PA-C      . divalproex (DEPAKOTE) DR tablet 250 mg  250 mg Oral Q12H Waylan Boga, NP      . ibuprofen (ADVIL,MOTRIN) tablet 600 mg  600 mg Oral Q8H PRN Domenic Moras, PA-C      . LORazepam (ATIVAN) tablet 0-4 mg  0-4 mg Oral 4 times per day Domenic Moras, PA-C   1 mg at 05/17/14 1050   Followed by  . [START ON 05/19/2014] LORazepam (ATIVAN) tablet 0-4 mg  0-4 mg Oral Q12H Domenic Moras, PA-C      . methocarbamol (ROBAXIN) tablet 500 mg  500 mg Oral Q12H PRN Waylan Boga, NP   500 mg at 05/17/14 1235  . nicotine (NICODERM CQ - dosed in mg/24 hours) patch 21 mg  21 mg Transdermal Daily Domenic Moras, PA-C   21 mg at 05/17/14 0107  . ondansetron (ZOFRAN) tablet 4 mg  4 mg Oral Q8H PRN Domenic Moras, PA-C   4 mg at 05/17/14 1050  . thiamine (VITAMIN B-1) tablet 100 mg  100 mg Oral Daily Domenic Moras,  PA-C   100 mg at 05/17/14 1049   Or  . thiamine (B-1) injection 100 mg  100 mg Intravenous Daily Domenic Moras, PA-C      . zolpidem (AMBIEN) tablet 5 mg  5 mg Oral QHS PRN Domenic Moras, PA-C       Current Outpatient Prescriptions  Medication Sig Dispense Refill  . naproxen sodium (ANAPROX) 220 MG tablet Take 440 mg by mouth daily as needed (pain).        Psychiatric Specialty Exam:     Blood pressure 113/66, pulse 69, temperature 98.7 F (37.1 C), temperature source Oral, resp. rate 20, last menstrual period 04/25/2014, SpO2 99.00%.There is no weight on file to calculate BMI.  General Appearance: Disheveled  Eye Sport and exercise psychologist::  Fair  Speech:  Normal Rate  Volume:  Normal  Mood:  Anxious, Depressed, Hopeless and Irritable  Affect:  Congruent  Thought Process:  Coherent  Orientation:  Full (Time, Place, and Person)  Thought Content:  WDL  Suicidal Thoughts:  Yes.  with intent/plan  Homicidal Thoughts:  No  Memory:  Immediate;   Fair Recent;   Fair Remote;   Fair  Judgement:  Poor  Insight:  Fair  Psychomotor Activity:  Decreased  Concentration:  Fair  Recall:  [employer] of Knowledge:Fair  Language: Good  Akathisia:  No  Handed:  Right  AIMS (if indicated):     Assets:  Leisure Time Physical Health Resilience  Sleep:      Musculoskeletal: Strength & Muscle Tone: within normal limits Gait & Station: normal Patient leans: N/A  Treatment Plan Summary: Daily contact with patient to assess and evaluate symptoms and progress in treatment Medication management; Ativan detox protocol started along with Depakote 250 mg BID for mood stability, PRN withdrawal medications in place.  Waylan Boga, [place] 05/17/2014 1:41 PM  I have personally seen the patient and agreed with the findings and involved in the treatment plan. Berniece Andreas, MD

## 2014-05-17 NOTE — Progress Notes (Signed)
The following facilities with SA programming have been contacted regarding bed availability for inpt tx:  Pt's referral has been faxed to the following facilites: [facility]- per Anselm [facility]eed[facility]- per August Saucerean can fax for review [facility]- per Toula MoosLorrhea accepting referrals [facility]- [facility], one female bed available  The following facilities are at capacity: [facility]- per Calvin [facility]- per Denna HaggardAnny [facility]- per Sharon SellerJerry Gaston- per Halina MaidensSharon Rutherford- per [facility]- per Danae OrleansKinley Stanley- per [facility]- per [facility]- per Allyson SabalMarcy Rowan- per Britta MccreedyBarbara; gero beds only [facility]- per [facility]- per Charolotte CapuchinSusie Pardee- per [facility]- per Brayton CavesJessie [facility]- per [facility]- per [facility]   Nare Gaspari Disposition MHT

## 2014-05-17 NOTE — ED Notes (Signed)
Patient pleasant and cooperative with care. Pt with dull, flat affect endorsing depression. Pt denies SI or plans to harm herself at this time. No s/s of distress noted.

## 2014-05-17 NOTE — ED Provider Notes (Signed)
Medical screening examination/treatment/procedure(s) were performed by non-physician practitioner and as supervising physician I was immediately available for consultation/collaboration.   EKG Interpretation None        Purvis SheffieldForrest Chiante Peden, MD 05/17/14 [phone]

## 2014-05-18 ENCOUNTER — Encounter (HOSPITAL_COMMUNITY): Payer: Self-pay | Admitting: Psychiatry

## 2014-05-18 MED ORDER — DICYCLOMINE HCL 20 MG PO TABS
20.0000 mg | ORAL_TABLET | Freq: Four times a day (QID) | ORAL | Status: DC | PRN
  Administered 2014-05-18: 20 mg via ORAL
  Filled 2014-05-18: qty 1

## 2014-05-18 MED ORDER — HYDROXYZINE HCL 25 MG PO TABS: 25.0000 mg | ORAL_TABLET | Freq: Four times a day (QID) | ORAL | Status: DC | PRN

## 2014-05-18 MED ORDER — NAPROXEN 500 MG PO TABS
500.0000 mg | ORAL_TABLET | Freq: Two times a day (BID) | ORAL | Status: DC | PRN
  Administered 2014-05-18: 500 mg via ORAL
  Filled 2014-05-18: qty 1

## 2014-05-18 MED ORDER — CLONIDINE HCL 0.1 MG PO TABS
0.1000 mg | ORAL_TABLET | Freq: Four times a day (QID) | ORAL | Status: DC
  Administered 2014-05-18: 0.1 mg via ORAL
  Filled 2014-05-18 (×2): qty 1

## 2014-05-18 MED ORDER — CLONIDINE HCL 0.1 MG PO TABS: 0.1000 mg | ORAL_TABLET | Freq: Every day | ORAL | Status: DC

## 2014-05-18 MED ORDER — CHLORDIAZEPOXIDE HCL 25 MG PO CAPS
25.0000 mg | ORAL_CAPSULE | Freq: Four times a day (QID) | ORAL | Status: DC | PRN
  Administered 2014-05-19: 25 mg via ORAL
  Filled 2014-05-18: qty 1

## 2014-05-18 MED ORDER — ONDANSETRON 4 MG PO TBDP: 4.0000 mg | ORAL_TABLET | Freq: Four times a day (QID) | ORAL | Status: DC | PRN

## 2014-05-18 MED ORDER — LOPERAMIDE HCL 2 MG PO CAPS: 2.0000 mg | ORAL_CAPSULE | ORAL | Status: DC | PRN

## 2014-05-18 MED ORDER — METHOCARBAMOL 500 MG PO TABS
500.0000 mg | ORAL_TABLET | Freq: Three times a day (TID) | ORAL | Status: DC | PRN
  Administered 2014-05-18: 500 mg via ORAL
  Filled 2014-05-18: qty 1

## 2014-05-18 MED ORDER — CLONIDINE HCL 0.1 MG PO TABS: 0.1000 mg | ORAL_TABLET | ORAL | Status: DC

## 2014-05-18 NOTE — Consult Note (Signed)
[facility] Face-to-Face Psychiatry Consult   Reason for Consult:  Alcohol/benzodiazepine detox/dependence Referring Physician:  EDP  Rita Carey is an 33 y.o. female. Total Time spent with patient: 20 minutes  Assessment: AXIS I:  Alcohol Abuse, Bipolar, Depressed, Generalized Anxiety Disorder and Substance Abuse AXIS II:  Deferred AXIS III:   Past Medical History  Diagnosis Date  . Insomnia   . Anxiety   . Anxiety   . Depression   . Polysubstance abuse    AXIS IV:  economic problems, occupational problems, other psychosocial or environmental problems, problems related to social environment and problems with primary support group AXIS V:  21-30 behavior considerably influenced by delusions or hallucinations OR serious impairment in judgment, communication OR inability to function in almost all areas  Plan:  Recommend psychiatric Inpatient admission when medically cleared.  Dr. Adele Schilder assessed the patient and concurs with the plan.  Subjective:   Rita Carey is a 33 y.o. female patient admitted with alcohol/benzodiazepine detox/dependence.  HPI:  Patient remains suicidal with a plan to overdose, withdrawal symptoms have improved with the Clonidine and Librium protocol.  Denies homicidal ideations and hallucinations. HPI Elements:   Location:  generalized. Quality:  acute. Severity:  severe. Timing:  constant. Duration:  few weeks. Context:  death of her mother, increase in drinking.  Past Psychiatric History: Past Medical History  Diagnosis Date  . Insomnia   . Anxiety   . Anxiety   . Depression   . Polysubstance abuse     reports that she has been smoking Cigarettes.  She has a 11 pack-year smoking history. She has never used smokeless tobacco. She reports that she drinks about 10.5 ounces of alcohol per week. She reports that she uses illicit drugs (Marijuana, Oxycodone, and Heroin). History reviewed. No pertinent family history. Family History Substance Abuse: Yes,  Describe: (ETOH, Benzo, THC) Family Supports: No Living Arrangements: Other (Comment) (Roommate) Can pt return to current living arrangement?: Yes   Allergies:  No Known Allergies  ACT Assessment Complete:  Yes:    Educational Status    Risk to Self: Risk to self with the past 6 months Suicidal Ideation: No-Not Currently/Within Last 6 Months Suicidal Intent: No Is patient at risk for suicide?: No Suicidal Plan?: No Access to Means: No What has been your use of drugs/alcohol within the last 12 months?: ETOH, Benzo, THC Previous Attempts/Gestures: No How many times?: 0 Other Self Harm Risks: Risky behaviors (Riding scooter w/o lights on dark road) Triggers for Past Attempts: None known Intentional Self Injurious Behavior: None Family Suicide History: Unknown Recent stressful life event(s): Loss (Comment);Financial Problems (Mother death in 2013/01/05,  estranged from Father & siblings) Persecutory voices/beliefs?: No Depression: Yes Depression Symptoms: Insomnia;Tearfulness;Isolating;Fatigue;Feeling angry/irritable;Feeling worthless/self pity;Despondent Substance abuse history and/or treatment for substance abuse?: No Suicide prevention information given to non-admitted patients: Not applicable  Risk to Others: Risk to Others within the past 6 months Homicidal Ideation: No Thoughts of Harm to Others: No Current Homicidal Intent: No Current Homicidal Plan: No Access to Homicidal Means: No Identified Victim: N/A History of harm to others?: No Assessment of Violence: None Noted Violent Behavior Description: N/A Does patient have access to weapons?: No Criminal Charges Pending?: Yes Describe Pending Criminal Charges: [place] (PT reported shop lifting ETOH) Does patient have a court date: Yes Court Date: 05/19/14  Abuse:    Prior Inpatient Therapy: Prior Inpatient Therapy Prior Inpatient Therapy: Yes Prior Therapy Dates: 01-05-13 Prior Therapy Facilty/Provider(s): [facility] Reason for  Treatment: Opiate W/D  Prior Outpatient Therapy: Prior Outpatient Therapy Prior Outpatient Therapy: Yes Prior Therapy Dates: 2015 Prior Therapy Facilty/Provider(s): [facility] Reason for Treatment: PTSD, GAD  Additional Information: Additional Information 1:1 In Past 12 Months?: No CIRT Risk: No Elopement Risk: No Does patient have medical clearance?: No                  Objective: Blood pressure 108/63, pulse 73, temperature 99 F (37.2 C), temperature source Oral, resp. rate 16, last menstrual period 04/25/2014, SpO2 98.00%.There is no weight on file to calculate BMI. Results for orders placed during the hospital encounter of 05/16/14 (from the past 72 hour(s))  ACETAMINOPHEN LEVEL     Status: None   Collection Time    05/16/14  9:35 PM      Result Value Ref Range   Acetaminophen (Tylenol), Serum <15.0  10 - 30 ug/mL   Comment:            THERAPEUTIC CONCENTRATIONS VARY     SIGNIFICANTLY. A RANGE OF 10-30     ug/mL MAY BE AN EFFECTIVE     CONCENTRATION FOR MANY PATIENTS.     HOWEVER, SOME ARE BEST TREATED     AT CONCENTRATIONS OUTSIDE THIS     RANGE.     ACETAMINOPHEN CONCENTRATIONS     >150 ug/mL AT 4 HOURS AFTER     INGESTION AND >50 ug/mL AT 12     HOURS AFTER INGESTION ARE     OFTEN ASSOCIATED WITH TOXIC     REACTIONS.  CBC     Status: None   Collection Time    05/16/14  9:35 PM      Result Value Ref Range   WBC 6.6  4.0 - 10.5 K/uL   RBC 4.25  3.87 - 5.11 MIL/uL   Hemoglobin 13.9  12.0 - 15.0 g/dL   HCT 39.1  36.0 - 46.0 %   MCV 92.0  78.0 - 100.0 fL   MCH 32.7  26.0 - 34.0 pg   MCHC 35.5  30.0 - 36.0 g/dL   RDW 11.6  11.5 - 15.5 %   Platelets 196  150 - 400 K/uL  COMPREHENSIVE METABOLIC PANEL     Status: Abnormal   Collection Time    05/16/14  9:35 PM      Result Value Ref Range   Sodium 140  137 - 147 mEq/L   Potassium 4.2  3.7 - 5.3 mEq/L   Chloride 104  96 - 112 mEq/L   CO2 26  19 - 32 mEq/L   Glucose, Bld 78  70 - 99 mg/dL   BUN 14  6 -  23 mg/dL   Creatinine, Ser 0.77  0.50 - 1.10 mg/dL   Calcium 8.6  8.4 - 10.5 mg/dL   Total Protein 6.9  6.0 - 8.3 g/dL   Albumin 3.6  3.5 - 5.2 g/dL   AST 22  0 - 37 U/L   ALT 10  0 - 35 U/L   Alkaline Phosphatase 81  39 - 117 U/L   Total Bilirubin <0.2 (*) 0.3 - 1.2 mg/dL   GFR calc non Af Amer >90  >90 mL/min   GFR calc Af Amer >90  >90 mL/min   Comment: (NOTE)     The eGFR has been calculated using the CKD EPI equation.     This calculation has not been validated in all clinical situations.     eGFR's persistently <90 mL/min signify possible Chronic Kidney  Disease.   Anion gap 10  5 - 15  ETHANOL     Status: Abnormal   Collection Time    05/16/14  9:35 PM      Result Value Ref Range   Alcohol, Ethyl (B) 141 (*) 0 - 11 mg/dL   Comment:            LOWEST DETECTABLE LIMIT FOR     SERUM ALCOHOL IS 11 mg/dL     FOR MEDICAL PURPOSES ONLY  SALICYLATE LEVEL     Status: Abnormal   Collection Time    05/16/14  9:35 PM      Result Value Ref Range   Salicylate Lvl <3.6 (*) 2.8 - 20.0 mg/dL  URINE RAPID DRUG SCREEN (HOSP PERFORMED)     Status: Abnormal   Collection Time    05/17/14 12:24 AM      Result Value Ref Range   Opiates NONE DETECTED  NONE DETECTED   Cocaine NONE DETECTED  NONE DETECTED   Benzodiazepines POSITIVE (*) NONE DETECTED   Amphetamines NONE DETECTED  NONE DETECTED   Tetrahydrocannabinol POSITIVE (*) NONE DETECTED   Barbiturates NONE DETECTED  NONE DETECTED   Comment:            DRUG SCREEN FOR MEDICAL PURPOSES     ONLY.  IF CONFIRMATION IS NEEDED     FOR ANY PURPOSE, NOTIFY LAB     WITHIN 5 DAYS.                LOWEST DETECTABLE LIMITS     FOR URINE DRUG SCREEN     Drug Class       Cutoff (ng/mL)     Amphetamine      1000     Barbiturate      200     Benzodiazepine   644     Tricyclics       034     Opiates          300     Cocaine          300     THC              50  POC URINE PREG, ED     Status: None   Collection Time    05/17/14 12:33 AM       Result Value Ref Range   Preg Test, Ur NEGATIVE  NEGATIVE   Comment:            THE SENSITIVITY OF THIS     METHODOLOGY IS >24 mIU/mL   Labs are reviewed and are pertinent for no medical issues noted.  Current Facility-Administered Medications  Medication Dose Route Frequency Provider Last Rate Last Dose  . acetaminophen (TYLENOL) tablet 650 mg  650 mg Oral Q4H PRN Domenic Moras, PA-C   650 mg at 05/18/14 0235  . alum & mag hydroxide-simeth (MAALOX/MYLANTA) 200-200-20 MG/5ML suspension 30 mL  30 mL Oral PRN Domenic Moras, PA-C   30 mL at 05/18/14 0534  . dicyclomine (BENTYL) tablet 20 mg  20 mg Oral Q6H PRN Waylan Boga, NP      . divalproex (DEPAKOTE) DR tablet 250 mg  250 mg Oral Q12H Waylan Boga, NP   250 mg at 05/18/14 7425  . hydrOXYzine (ATARAX/VISTARIL) tablet 25 mg  25 mg Oral Q6H PRN Waylan Boga, NP      . ibuprofen (ADVIL,MOTRIN) tablet 600 mg  600 mg Oral Q8H PRN Domenic Moras, PA-C      .  loperamide (IMODIUM) capsule 2-4 mg  2-4 mg Oral PRN Waylan Boga, NP      . LORazepam (ATIVAN) tablet 0-4 mg  0-4 mg Oral 4 times per day Domenic Moras, PA-C   1 mg at 05/18/14 0453   Followed by  . [START ON 05/19/2014] LORazepam (ATIVAN) tablet 0-4 mg  0-4 mg Oral Q12H Domenic Moras, PA-C      . methocarbamol (ROBAXIN) tablet 500 mg  500 mg Oral Q8H PRN Waylan Boga, NP   500 mg at 05/18/14 0534  . nicotine (NICODERM CQ - dosed in mg/24 hours) patch 21 mg  21 mg Transdermal Daily Domenic Moras, PA-C   21 mg at 05/18/14 0930  . ondansetron (ZOFRAN) tablet 4 mg  4 mg Oral Q8H PRN Domenic Moras, PA-C   4 mg at 05/17/14 1050  . ondansetron (ZOFRAN-ODT) disintegrating tablet 4 mg  4 mg Oral Q6H PRN Waylan Boga, NP      . thiamine (VITAMIN B-1) tablet 100 mg  100 mg Oral Daily Domenic Moras, PA-C   100 mg at 05/18/14 2992   Or  . thiamine (B-1) injection 100 mg  100 mg Intravenous Daily Domenic Moras, PA-C      . zolpidem (AMBIEN) tablet 5 mg  5 mg Oral QHS PRN Domenic Moras, PA-C   5 mg at 05/17/14 2100   Current  Outpatient Prescriptions  Medication Sig Dispense Refill  . naproxen sodium (ANAPROX) 220 MG tablet Take 440 mg by mouth daily as needed (pain).        Psychiatric Specialty Exam:     Blood pressure 108/63, pulse 73, temperature 99 F (37.2 C), temperature source Oral, resp. rate 16, last menstrual period 04/25/2014, SpO2 98.00%.There is no weight on file to calculate BMI.  General Appearance: Disheveled  Eye Sport and exercise psychologist::  Fair  Speech:  Normal Rate  Volume:  Normal  Mood:  Anxious, Depressed, Hopeless and Irritable  Affect:  Congruent  Thought Process:  Coherent  Orientation:  Full (Time, Place, and Person)  Thought Content:  WDL  Suicidal Thoughts:  Yes.  with intent/plan  Homicidal Thoughts:  No  Memory:  Immediate;   Fair Recent;   Fair Remote;   Fair  Judgement:  Poor  Insight:  Fair  Psychomotor Activity:  Decreased  Concentration:  Fair  Recall:  [employer] of Knowledge:Fair  Language: Good  Akathisia:  No  Handed:  Right  AIMS (if indicated):     Assets:  Leisure Time Physical Health Resilience  Sleep:      Musculoskeletal: Strength & Muscle Tone: within normal limits Gait & Station: normal Patient leans: N/A  Treatment Plan Summary: Daily contact with patient to assess and evaluate symptoms and progress in treatment Medication management; Ativan detox protocol started along with Depakote 250 mg BID for mood stability, PRN withdrawal medications in place.  Waylan Boga, [place] 05/18/2014 10:04 AM  I have personally seen the patient and agreed with the findings and involved in the treatment plan. Berniece Andreas, MD

## 2014-05-18 NOTE — Progress Notes (Signed)
[facility] [place] [facility] stating that pt has been [facility] by [facility] Rob[facility]otakura [facility] but can not leave until tomorrow (Monday 05/19/14) after 8am.  Report number is [phone].  [facility] made aware of updated information.   Tomi BambergerMariya Joleigh Mineau Disposition MHT

## 2014-05-18 NOTE — BH Assessment (Signed)
Rita Carey, AC at [facility], confirmed adult unit is currently at capacity. Contacted the following facilities for placement:   BED AVAILABLE, FAXED CLINICAL INFORMATION:  [facility], per [facility]  [facility], per [facility]  [facility], per [facility]  [facility], per Claudine   PT ALREADY UNDER REVIEW: [facility]  [place]  AT CAPACITY:  [facility], per Earl LitesJennifer  [facility], per [facility]  [facility], per [facility]  [facility], per [facility]  [facility], per [facility]  [facility], per [facility]  [facility], per [facility]  [facility], per [facility]  [facility], per DIRECTVJennifer   NO RESPONSE:  [facility]  [facility]  [facility] GroveBrynn Marr  PT DECLINED: [facility]   [address] Ellis Patsy BaltimoreWarrick Jr, WisconsinLPC, [facility] Triage Specialist [phone]

## 2014-05-18 NOTE — ED Notes (Signed)
All belongings to locker 33.

## 2014-05-19 NOTE — ED Notes (Signed)
Patient is at nursing station agitated and demanding to go home, crying and screaming, taking off her shirt; saying bring it on. Patient states that she has a court date this morning and was hoping that she could call the clerk of court and get an excuse for court.

## 2014-05-19 NOTE — Discharge Instructions (Signed)
Benzodiazepine Withdrawal  °Benzodiazepines are a group of drugs that are prescribed for both short-term and long-term treatment of a variety of medical conditions. For some of these conditions, such as seizures and sudden and severe muscle spasms, they are used only for a few hours or a few days. For other conditions, such as anxiety, sleep problems, or frequent muscle spasms or to help prevent seizures, they are used for an extended period, usually weeks or months. °Benzodiazepines work by changing the way your brain functions. Normally, chemicals in your brain called neurotransmitters send messages between your brain cells. The neurotransmitter that benzodiazepines affect is called gamma-aminobutyric acid (GABA). GABA sends out messages that have a calming effect on many of the functions of your brain. Benzodiazepines make these messages stronger and increase this calming effect. °Short-term use of benzodiazepines usually does not cause problems when you stop taking the drugs. However, if you take benzodiazepines for a long time, your body can adjust to the drug and require more of it to produce the same effect (drug tolerance). Eventually, you can develop physical dependence on benzodiazepines, which is when you experience negative effects if your dosage of benzodiazepines is reduced or stopped too quickly. These negative effects are called symptoms of withdrawal. °SYMPTOMS °Symptoms of withdrawal may begin anytime within the first 10 days after you stop taking the benzodiazepine. They can last from several weeks up to a few months but usually are the worst between the first 10 to 14 days.  °The actual symptoms also vary, depending on the type of benzodiazepine you take. Possible symptoms include: °· Anxiety. °· Excitability. °· Irritability. °· Depression. °· Mood swings. °· Trouble sleeping. °· Confusion. °· Uncontrollable shaking (tremors). °· Muscle weakness. °· Seizures. °DIAGNOSIS °To diagnose  benzodiazepine withdrawal, your caregiver will examine you for certain signs, such as: °· Rapid heartbeat. °· Rapid breathing. °· Tremors. °· High blood pressure. °· Fever. °· Mood changes. °Your caregiver also may ask the following questions about your use of benzodiazepines: °· What type of benzodiazepine did you take? °· How much did you take each day? °· How long did you take the drug? °· When was the last time you took the drug? °· Do you take any other drugs? °· Have you had alcohol recently? °· Have you had a seizure recently? °· Have you lost consciousness recently? °· Have you had trouble remembering recent events? °· Have you had a recent increase in anxiety, irritability, or trouble sleeping? °A drug test also may be administered. °TREATMENT °The treatment for benzodiazepine withdrawal can vary, depending on the type and severity of your symptoms, what type of benzodiazepine you have been taking, and how long you have been taking the benzodiazepine. Sometimes it is necessary for you to be treated in a hospital, especially if you are at risk of seizures.  °Often, treatment includes a prescription for a long-acting benzodiazepine, the dosage of which is reduced slowly over a long period. This period could be several weeks or months. Eventually, your dosage will be reduced to a point that you can stop taking the drug, without experiencing withdrawal symptoms. This is called tapered withdrawal. Occasionally, minor symptoms of withdrawal continue for a few days or weeks after you have completed a tapered withdrawal. °SEEK IMMEDIATE MEDICAL CARE IF: °· You have a seizure. °· You develop a craving for drugs or alcohol. °· You begin to experience symptoms of withdrawal during your tapered withdrawal. °· You become very confused. °· You lose consciousness. °· You   have trouble breathing. °· You think about hurting yourself or someone else. °Document Released: 09/15/2011 Document Revised: 12/19/2011 Document  Reviewed: 09/15/2011 °ExitCare® Patient Information ©2015 [employer]. This information is not intended to replace advice given to you by your health care provider. Make sure you discuss any questions you have with your health care provider. ° ° °Emergency Department Resource Guide °1) Find a Doctor and Pay Out of Pocket °Although you won't have to find out who is covered by your insurance plan, it is a good idea to ask around and get recommendations. You will then need to call the office and see if the doctor you have chosen will accept you as a new patient and what types of options they offer for patients who are self-pay. Some doctors offer discounts or will set up payment plans for their patients who do not have insurance, but you will need to ask so you aren't surprised when you get to your appointment. ° °2) Contact Your Local Health Department °Not all health departments have doctors that can see patients for sick visits, but many do, so it is worth a call to see if yours does. If you don't know where your local health department is, you can check in your phone book. The CDC also has a tool to help you locate your state's health department, and many state websites also have listings of all of their local health departments. ° °3) Find a Walk-in Clinic °If your illness is not likely to be very severe or complicated, you may want to try a walk in clinic. These are popping up all over the country in pharmacies, drugstores, and shopping centers. They're usually staffed by nurse practitioners or physician assistants that have been trained to treat common illnesses and complaints. They're usually fairly quick and inexpensive. However, if you have serious medical issues or chronic medical problems, these are probably not your best option. ° °No Primary Care Doctor: °- Call [facility] at  832-8000 - they can help you locate a primary care doctor that  accepts your insurance, provides certain services,  etc. °- Physician Referral Service- 1-800-533-3463 ° °Chronic Pain Problems: °Organization         Address  Phone   Notes  ° [facility]  (336) 297-2271 Patients need to be referred by their primary care doctor.  ° °Medication Assistance: °Organization         Address  Phone   Notes  °[place] [facility] [address] °[place], [place] [address] (336) 641-8030 --Must be a resident of [place] °-- Must have NO insurance coverage whatsoever (no Medicaid/ Medicare, etc.) °-- The pt. MUST have a primary care doctor that directs their care regularly and follows them in the community °  °[facility]  (866) 331-1348   °[employer]  (888) 892-1162   ° °Agencies that provide inexpensive medical care: °Organization         Address  Phone   Notes  °[facility]  (336) 832-8035   °[facility]    (336) 832-7272   °[facility] [address] °[place], [place] [address] (336) 832-4777   °[facility] [address], °[place] (336) 271-4999   °[facility]    (336) 373-0678   °[facility]    (336) 272-1050   °[facility] ° [address], [place] Phone:  (336) 832-4444, Fax:  (  336) 832-4440 Hours of Operation:  9 am - 6 pm, M-F.  Also accepts Medicaid/Medicare and self-pay.  °[facility] ° [address], [place] Phone: (336) 832-3150, Fax: (336) 832-3151. Hours of Operation:  8:30 am - 5:30 pm, M-F.  Also accepts Medicaid and self-pay.  °[facility] [place] [address], [place] Phone: (336) 878-6027   °[facility] [address], [place], [place] (336)723-1848, Ext. 123 Mondays & Thursdays: 7-9 AM.  First 15 patients are seen on a first come, first serve basis. °  ° °Medicaid-accepting [place] Providers: ° °Organization         Address  Phone   Notes  °[facility] [address], [place] (336) 641-2100 Also accepts self-pay patients.  °[facility] [address], [place] ° (336) 856-9996   °[facility] [address], [place] (336) 288-8857   °[facility] [address] [place] [address], [place] (336) 299-7000   °Veita Bland [address], [place]  ° (336) 373-1557 Only accepts [place] [facility] Medicaid patients after they have their name applied to their card.  ° °Self-Pay (no insurance) in [place]: ° °Organization         Address  Phone   Notes  °Sickle Cell Patients, [facility] [address], [place] (336) 832-1970   °[facility] [address], [place] (336) 832-4400   °[facility] ° [address], [place] (336) 992-4800   °[facility]/Dr. Osei-Bonsu ° [address] [place] [address], [place] or [address], [place] (336) 841-8500 Phone number for both [place] and [place] locations is the same.  °[facility] [address], [place] (336) 299-0000   °[facility] [address] [place] [address], [place] or [address] (336) 852-7530 °(336) 878-2260   °[facility] [address], [place] (336) 350-1642, phone; (336) 294-5005, fax Sees patients 1st and 3rd Saturday of every month.  Must not qualify for public or private insurance (i.e. Medicaid, Medicare, [place] Health Choice, Veterans' Benefits) • Household income should be no more than 200% of the poverty level •The clinic cannot treat you if you are pregnant or think you are pregnant • Sexually transmitted diseases are not treated at the clinic.  ° ° °Dental Care: °Organization         Address  Phone  Notes  °[facility] [address], [place] (336) 641-6152 Accepts children up to  age 21 who are enrolled in Medicaid or [place] Health Choice; pregnant women with a Medicaid card; and children who have applied for Medicaid or [place] Health Choice, but were declined, whose parents can pay a reduced fee at time of service.  °[facility] [place]  [address], [place] (336) 641-7733 Accepts children up to age 21 who are enrolled in Medicaid or [place] Health Choice; pregnant women with a Medicaid card; and children who have applied for Medicaid or [place] Health Choice, but were declined, whose parents can pay a reduced fee at time of service.  °[facility] ° [address], [place] (336) 641-4533 Patients are seen by appointment only. Walk-ins are not accepted. [facility] will see patients 18 years of age and older. °Monday - Tuesday (8am-5pm) °Most Wednesdays (8:30-5pm) °$30 per visit, cash only  °[facility] ° [address], [place] (336) 641-4533 Patients are seen by appointment only. Walk-ins are not accepted. [facility] will see patients 18 years of age and older. °One Wednesday Evening (Monthly: Volunteer Based).  $30 per visit, cash only  °[facility]  (919) 537-3737 for adults; Children under age 4, call Graduate Pediatric Dentistry at (919) [phone]. Children aged 4-14, please call (919) [phone] to request a pediatric application. ° Dental services are provided in all areas of dental care including fillings, crowns and bridges, complete and partial dentures, implants, gum treatment, root canals, and extractions. Preventive care is also provided. Treatment is provided to both adults and children. °Patients are selected via a lottery and there is often a waiting list. °  °[facility] [address], °[place] ° (336) 763-8833 [email or web address] °  °[facility] [address], [place], [place] (336)723-1848, Ext. 123 Second and Fourth Thursday of  each month, opens at 6:30 AM; Clinic ends at 9 AM.  Patients are seen on a first-come first-served basis, and a limited number are seen during each clinic.  ° °[facility] ° [address], [place], [place] (336) 723-7904   Eligibility Requirements °You must have lived in [place], [place], or [place] counties for at least the last three months. °  You cannot be eligible for state or federal sponsored healthcare insurance, including Veterans Administration, Medicaid, or Medicare. °  You generally cannot be eligible for healthcare insurance through your employer.  °  How to apply: °Eligibility screenings are held every Tuesday and Wednesday afternoon from 1:00 pm until 4:00 pm. You do not need an appointment for the interview!  °[facility] [address], [place], [place] 336-631-2330   °[facility]  336-342-8273   °[facility]  336-703-3100   °[facility]  336-570-6415   ° °Behavioral Health Resources in the Community: °Intensive Outpatient Programs °Organization         Address  Phone  Notes  °[facility] [address], [place], [place] 336-878-6098   °[facility] [address], [place], [place] 336-832-9800   °[facility] [address], [place], [place] ° 336-882-2125   °[facility] [address],  °[place], [place] 1-800-853-5163 or 336-641-4981   °Substance Abuse Resources °Organization         Address  Phone  Notes  °[facility]  336-882-2125   °[facility]  336-784-9470   °[facility]  336-285-9073   °[facility]  336-845-3988   °Residential & Outpatient Substance Abuse Program  1-800-659-3381   °Psychological Services °Organization         Address  Phone  Notes  °[facility]  336- 832-9600   °[facility]  336- 378-7881   °[facility] [address],  [place] 1-800-853-5163 or 336-641-4981   ° °Mobile Crisis Teams °Organization         Address  Phone  Notes  °[facility], Mobile Crisis Care Unit  1-877-626-1772   °Assertive °[facility] ° [address] [place], [place] 336-834-9664   °Sharon DeEsch [address] °[place] 336-554-5454   ° °Self-Help/Support Groups °Organization           Address  Phone             Notes  °[facility] [place] - variety of support groups  336- 373-1402 Call for more information  °Narcotics Anonymous (NA), [facility] [address], °[place]  2 meetings at this location  ° °Residential Treatment Programs °Organization         Address  Phone  Notes  °[facility] [address],    °[place]  1-866-801-8205   °[facility] ° [address], [place], [place] 704-293-8524   °[facility] [address], [place] 336-845-3988 Admissions: 8am-3pm M-F  °[facility] [address]    °[place], [place] 336-841-1104   °[facility] [address], [place], [place] 336-379-7146   °[facility] [address]  °[place], [place] 336-285-9073   °[facility] - Intensive Outpatient [address], [place], [place] 336-852-3033   °[facility] [address]  °[place], [place] 1-877-615-2722 or 336-784-9470   °[facility] [address] [place], [place] 336-227-7417 Accepts Medicaid  °[facility] [address]  °[place] 1-800-659-3381 Substance Abuse/Addiction Treatment  ° °[place] Behavioral Health Resources °Organization         Address  Phone  Notes  °[facility]  (888) 581-9988   °Julie Brannon, PhD [address] [place], [place]   (336) 349-5553 or (336) 951-0000   °[facility]   [address] °[place], [place] (336) 349-4454     °[facility] [address], [place], [place] (336) 342-8316 Insurance/Medicaid/sponsorship through [facility]  °[facility] [address]                                    [place], [place] (336) 342-8316 Therapy/tele-psych/case  °[facility] [address]  ° [place], [place] (336) 349-2233    °Dr. Arfeen  (336) 349-4544   °[facility] [place]  [employer] [place] [facility] 1) [address],  °2) [address], [place] °3)  [address], [place] (336) 349-3220 °(336) 342-7768 ° °(336) 342-8140   °[place] [facility] (336) 342-1394 or (336) 342-3537 (After Hours)    ° ° ° °

## 2014-05-19 NOTE — ED Provider Notes (Signed)
7:00 AM  Pt has decided to leave without further substance abuse treatment.  No SI, HI, self harm per psych.  Will dc with resources.  Layla MawKristen N Ward, DO 05/19/14 [phone]

## 2014-06-16 ENCOUNTER — Emergency Department (HOSPITAL_BASED_OUTPATIENT_CLINIC_OR_DEPARTMENT_OTHER)
Admission: EM | Admit: 2014-06-16 | Discharge: 2014-06-16 | Disposition: A | Discharge status: Home / Self Care | Payer: Self-pay | Attending: Emergency Medicine | Admitting: Emergency Medicine

## 2014-06-16 ENCOUNTER — Encounter (HOSPITAL_BASED_OUTPATIENT_CLINIC_OR_DEPARTMENT_OTHER): Payer: Self-pay | Admitting: Emergency Medicine

## 2014-06-16 DIAGNOSIS — K089 Disorder of teeth and supporting structures, unspecified: Secondary | ICD-10-CM | POA: Insufficient documentation

## 2014-06-16 DIAGNOSIS — F17200 Nicotine dependence, unspecified, uncomplicated: Secondary | ICD-10-CM | POA: Insufficient documentation

## 2014-06-16 DIAGNOSIS — Z791 Long term (current) use of non-steroidal anti-inflammatories (NSAID): Secondary | ICD-10-CM | POA: Insufficient documentation

## 2014-06-16 DIAGNOSIS — K0889 Other specified disorders of teeth and supporting structures: Secondary | ICD-10-CM

## 2014-06-16 DIAGNOSIS — Z8659 Personal history of other mental and behavioral disorders: Secondary | ICD-10-CM | POA: Insufficient documentation

## 2014-06-16 MED ORDER — IBUPROFEN 800 MG PO TABS: 800.0000 mg | ORAL_TABLET | Freq: Three times a day (TID) | ORAL | Status: DC

## 2014-06-16 MED ORDER — AMOXICILLIN 500 MG PO CAPS: 500.0000 mg | ORAL_CAPSULE | Freq: Three times a day (TID) | ORAL | Status: AC

## 2014-06-16 NOTE — ED Provider Notes (Signed)
CSN: [id number]     Arrival date & time 06/16/14  1637 History   First MD Initiated Contact with Patient 06/16/14 1701     Chief Complaint  Patient presents with  . Abscess     (Consider location/radiation/quality/duration/timing/severity/associated sxs/prior Treatment) Patient is a 33 y.o. female presenting with abscess. The history is provided by the patient. No language interpreter was used.  Abscess Location:  Mouth Mouth abscess location:  Floor of mouth Size:  0.3 Abscess quality: redness   Red streaking: no   Duration:  5 days Progression:  Worsening Chronicity:  New Relieved by:  Nothing Worsened by:  Nothing tried Ineffective treatments:  None tried Associated symptoms: no fever     Past Medical History  Diagnosis Date  . Insomnia   . Anxiety   . Anxiety   . Depression   . Polysubstance abuse    Past Surgical History  Procedure Laterality Date  . No past surgeries     No family history on file. History  Substance Use Topics  . Smoking status: Current Every Day Smoker -- 1.00 packs/day for 11 years    Types: Cigarettes  . Smokeless tobacco: Never Used  . Alcohol Use: 10.5 oz/week    21 drink(s) per week     Comment: 3 cans of high gravity   OB History   Grav Para Term Preterm Abortions TAB SAB Ect Mult Living                 Review of Systems  Constitutional: Negative for fever.  HENT: Positive for mouth sores.   All other systems reviewed and are negative.     Allergies  Review of patient's allergies indicates no known allergies.  Home Medications   Prior to Admission medications   Medication Sig Start Date End Date Taking? Authorizing Provider  naproxen sodium (ANAPROX) 220 MG tablet Take 440 mg by mouth daily as needed (pain).    Historical Provider, MD   BP 98/59  Pulse 100  Temp(Src) 98.5 F (36.9 C) (Oral)  Resp 18  Ht  (1.626 m)  Wt 113 lb (51.256 kg)  BMI 19.39 kg/m2  SpO2 100%  LMP 05/26/2014 Physical Exam  Nursing  note and vitals reviewed. Constitutional: She appears well-developed and well-nourished.  HENT:  Head: Normocephalic.  Musculoskeletal: She exhibits tenderness.  Tender swollen area inside lower mouth behind incisor  Neurological: She is alert.  Skin: There is erythema.  Psychiatric: She has a normal mood and affect.    ED Course  Procedures (including critical care time) Labs Review Labs Reviewed - No data to display  Imaging Review No results found.   EKG Interpretation None      MDM Pt is currently in treatment at [facility]   Final diagnoses:  Pain, dental    amoxicillain Ibuprofen 800    Lonia Skinner [place], [place] 06/16/14 [phone]

## 2014-06-16 NOTE — ED Provider Notes (Signed)
History/physical exam/procedure(s) were performed by non-physician practitioner and as supervising physician I was immediately available for consultation/collaboration. I have reviewed all notes and am in agreement with care and plan.   Hilario Quarry, MD 06/16/14 [phone]

## 2014-06-16 NOTE — ED Notes (Signed)
pa at bedside. 

## 2014-06-16 NOTE — ED Notes (Signed)
Pt presents to ed with complaints of mouth abscess for 5 days .

## 2014-06-16 NOTE — Discharge Instructions (Signed)

## 2014-06-27 ENCOUNTER — Encounter (HOSPITAL_BASED_OUTPATIENT_CLINIC_OR_DEPARTMENT_OTHER): Payer: Self-pay | Admitting: Emergency Medicine

## 2014-06-27 ENCOUNTER — Emergency Department (HOSPITAL_BASED_OUTPATIENT_CLINIC_OR_DEPARTMENT_OTHER)
Admission: EM | Admit: 2014-06-27 | Discharge: 2014-06-27 | Disposition: A | Discharge status: Home / Self Care | Payer: Self-pay | Attending: Emergency Medicine | Admitting: Emergency Medicine

## 2014-06-27 DIAGNOSIS — T3795XA Adverse effect of unspecified systemic anti-infective and antiparasitic, initial encounter: Secondary | ICD-10-CM

## 2014-06-27 DIAGNOSIS — R110 Nausea: Secondary | ICD-10-CM | POA: Insufficient documentation

## 2014-06-27 DIAGNOSIS — R21 Rash and other nonspecific skin eruption: Secondary | ICD-10-CM | POA: Insufficient documentation

## 2014-06-27 DIAGNOSIS — H53149 Visual discomfort, unspecified: Secondary | ICD-10-CM | POA: Insufficient documentation

## 2014-06-27 DIAGNOSIS — F411 Generalized anxiety disorder: Secondary | ICD-10-CM | POA: Insufficient documentation

## 2014-06-27 DIAGNOSIS — G43009 Migraine without aura, not intractable, without status migrainosus: Secondary | ICD-10-CM | POA: Insufficient documentation

## 2014-06-27 DIAGNOSIS — F329 Major depressive disorder, single episode, unspecified: Secondary | ICD-10-CM | POA: Insufficient documentation

## 2014-06-27 DIAGNOSIS — F3289 Other specified depressive episodes: Secondary | ICD-10-CM | POA: Insufficient documentation

## 2014-06-27 DIAGNOSIS — R51 Headache: Secondary | ICD-10-CM | POA: Insufficient documentation

## 2014-06-27 DIAGNOSIS — L270 Generalized skin eruption due to drugs and medicaments taken internally: Secondary | ICD-10-CM

## 2014-06-27 DIAGNOSIS — F17200 Nicotine dependence, unspecified, uncomplicated: Secondary | ICD-10-CM | POA: Insufficient documentation

## 2014-06-27 DIAGNOSIS — G4700 Insomnia, unspecified: Secondary | ICD-10-CM | POA: Insufficient documentation

## 2014-06-27 DIAGNOSIS — T3695XA Adverse effect of unspecified systemic antibiotic, initial encounter: Secondary | ICD-10-CM | POA: Insufficient documentation

## 2014-06-27 DIAGNOSIS — Z79899 Other long term (current) drug therapy: Secondary | ICD-10-CM | POA: Insufficient documentation

## 2014-06-27 MED ORDER — PREDNISONE 50 MG PO TABS: 60.0000 mg | ORAL_TABLET | Freq: Once | ORAL | Status: DC

## 2014-06-27 MED ORDER — FAMOTIDINE 20 MG PO TABS
20.0000 mg | ORAL_TABLET | Freq: Once | ORAL | Status: AC
  Administered 2014-06-27: 20 mg via ORAL
  Filled 2014-06-27: qty 1

## 2014-06-27 MED ORDER — PREDNISONE 20 MG PO TABS: 60.0000 mg | ORAL_TABLET | Freq: Every day | ORAL | Status: AC

## 2014-06-27 MED ORDER — FAMOTIDINE 20 MG PO TABS: 20.0000 mg | ORAL_TABLET | Freq: Two times a day (BID) | ORAL | Status: DC

## 2014-06-27 MED ORDER — METOCLOPRAMIDE HCL 5 MG/ML IJ SOLN
10.0000 mg | Freq: Once | INTRAMUSCULAR | Status: AC
  Administered 2014-06-27: 10 mg via INTRAMUSCULAR
  Filled 2014-06-27: qty 2

## 2014-06-27 MED ORDER — DIPHENHYDRAMINE HCL 25 MG PO TABS: 25.0000 mg | ORAL_TABLET | Freq: Four times a day (QID) | ORAL | Status: DC

## 2014-06-27 MED ORDER — DIPHENHYDRAMINE HCL 25 MG PO CAPS
25.0000 mg | ORAL_CAPSULE | Freq: Once | ORAL | Status: AC
  Administered 2014-06-27: 25 mg via ORAL
  Filled 2014-06-27: qty 1

## 2014-06-27 NOTE — Discharge Instructions (Signed)
Drug Allergy °Allergic reactions to medicines are common. Some allergic reactions are mild. A delayed type of drug allergy that occurs 1 week or more after exposure to a medicine or vaccine is called serum sickness. A life-threatening, sudden (acute) allergic reaction that involves the whole body is called anaphylaxis. °CAUSES  °"True" drug allergies occur when there is an allergic reaction to a medicine. This is caused by overactivity of the immune system. First, the body becomes sensitized. The immune system is triggered by your first exposure to the medicine. Following this first exposure, future exposure to the same medicine may be life-threatening. °Almost any medicine can cause an allergic reaction. Common ones are: °· Penicillin. °· Sulfonamides (sulfa drugs). °· Local anesthetics. °· X-ray dyes that contain iodine. °SYMPTOMS  °Common symptoms of a minor allergic reaction are: °· Swelling around the mouth. °· An itchy red rash or hives. °· Vomiting or diarrhea. °Anaphylaxis can cause swelling of the mouth and throat. This makes it difficult to breathe and swallow. Severe reactions can be fatal within seconds, even after exposure to only a trace amount of the drug that causes the reaction. °HOME CARE INSTRUCTIONS  °· If you are unsure of what caused your reaction, keep a diary of foods and medicines used. Include the symptoms that followed. Avoid anything that causes reactions. °· You may want to follow up with an allergy specialist after the reaction has cleared in order to be tested to confirm the allergy. It is important to confirm that your reaction is an allergy, not just a side effect to the medicine. If you have a true allergy to a medicine, this may prevent that medicine and related medicines from being given to you when you are very ill. °· If you have hives or a rash: °¨ Take medicines as directed by your caregiver. °¨ You may use an over-the-counter antihistamine (diphenhydramine) as  needed. °¨ Apply cold compresses to the skin or take baths in cool water. Avoid hot baths or showers. °· If you are severely allergic: °¨ Continuous observation after a severe reaction may be needed. Hospitalization is often required. °¨ Wear a medical alert bracelet or necklace stating your allergy. °¨ You and your family must learn how to use an anaphylaxis kit or give an epinephrine injection to temporarily treat an emergency allergic reaction. If you have had a severe reaction, always carry your epinephrine injection or anaphylaxis kit with you. This can be lifesaving if you have a severe reaction. °· Do not drive or perform tasks after treatment until the medicines used to treat your reaction have worn off, or until your caregiver says it is okay. °SEEK MEDICAL CARE IF:  °· You think you had an allergic reaction. Symptoms usually start within 30 minutes after exposure. °· Symptoms are getting worse rather than better. °· You develop new symptoms. °· The symptoms that brought you to your caregiver return. °SEEK IMMEDIATE MEDICAL CARE IF:  °· You have swelling of the mouth, difficulty breathing, or wheezing. °· You have a tight feeling in your chest or throat. °· You develop hives, swelling, or itching all over your body. °· You develop severe vomiting or diarrhea. °· You feel faint or pass out. °This is an emergency. Use your epinephrine injection or anaphylaxis kit as you have been instructed. Call for emergency medical help. Even if you improve after the injection, you need to be examined at a hospital emergency department. °MAKE SURE YOU:  °· Understand these instructions. °· Will watch   your condition.  Will get help right away if you are not doing well or get worse. Document Released: 09/26/2005 Document Revised: 12/19/2011 Document Reviewed: 03/02/2011 [facility] Patient Information 2015 [place], [place]. This information is not intended to replace advice given to you by your health care provider. Make  sure you discuss any questions you have with your health care provider. Migraine Headache A migraine headache is an intense, throbbing pain on one or both sides of your head. A migraine can last for 30 minutes to several hours. CAUSES  The exact cause of a migraine headache is not always known. However, a migraine may be caused when nerves in the brain become irritated and release chemicals that cause inflammation. This causes pain. Certain things may also trigger migraines, such as:  Alcohol.  Smoking.  Stress.  Menstruation.  Aged cheeses.  Foods or drinks that contain nitrates, glutamate, aspartame, or tyramine.  Lack of sleep.  Chocolate.  Caffeine.  Hunger.  Physical exertion.  Fatigue.  Medicines used to treat chest pain (nitroglycerine), birth control pills, estrogen, and some blood pressure medicines. SIGNS AND SYMPTOMS  Pain on one or both sides of your head.  Pulsating or throbbing pain.  Severe pain that prevents daily activities.  Pain that is aggravated by any physical activity.  Nausea, vomiting, or both.  Dizziness.  Pain with exposure to bright lights, loud noises, or activity.  General sensitivity to bright lights, loud noises, or smells. Before you get a migraine, you may get warning signs that a migraine is coming (aura). An aura may include:  Seeing flashing lights.  Seeing bright spots, halos, or zigzag lines.  Having tunnel vision or blurred vision.  Having feelings of numbness or tingling.  Having trouble talking.  Having muscle weakness. DIAGNOSIS  A migraine headache is often diagnosed based on:  Symptoms.  Physical exam.  A CT scan or MRI of your head. These imaging tests cannot diagnose migraines, but they can help rule out other causes of headaches. TREATMENT Medicines may be given for pain and nausea. Medicines can also be given to help prevent recurrent migraines.  HOME CARE INSTRUCTIONS  Only take  over-the-counter or prescription medicines for pain or discomfort as directed by your health care provider. The use of long-term narcotics is not recommended.  Lie down in a dark, quiet room when you have a migraine.  Keep a journal to find out what may trigger your migraine headaches. For example, write down:  What you eat and drink.  How much sleep you get.  Any change to your diet or medicines.  Limit alcohol consumption.  Quit smoking if you smoke.  Get 7-9 hours of sleep, or as recommended by your health care provider.  Limit stress.  Keep lights dim if bright lights bother you and make your migraines worse. SEEK IMMEDIATE MEDICAL CARE IF:   Your migraine becomes severe.  You have a fever.  You have a stiff neck.  You have vision loss.  You have muscular weakness or loss of muscle control.  You start losing your balance or have trouble walking.  You feel faint or pass out.  You have severe symptoms that are different from your first symptoms. MAKE SURE YOU:   Understand these instructions.  Will watch your condition.  Will get help right away if you are not doing well or get worse. Document Released: 09/26/2005 Document Revised: 02/10/2014 Document Reviewed: 06/03/2013 [facility] Patient Information 2015 [place], [place]. This information is not intended to replace  advice given to you by your health care provider. Make sure you discuss any questions you have with your health care provider.

## 2014-06-27 NOTE — ED Provider Notes (Signed)
CSN: [id number]     Arrival date & time 06/27/14  1327 History   First MD Initiated Contact with Patient 06/27/14 1447     Chief Complaint  Patient presents with  . Rash     (Consider location/radiation/quality/duration/timing/severity/associated sxs/prior Treatment) Patient is a 33 y.o. female presenting with rash and migraines. The history is provided by the patient. No language interpreter was used.  Rash Location:  Full body Associated symptoms: headaches and nausea   Associated symptoms: no fever, no shortness of breath and not vomiting   Associated symptoms comment:  Generalized rash that causes itching that started after taking Amoxil for a sore throat. She reports she did not have a rash until the last day of medication. No SOB, difficulty swallowing or persistent sore throat.  Migraine This is a new problem. The current episode started yesterday. Associated symptoms include headaches, nausea and a rash. Pertinent negatives include no chills, fever, vomiting or weakness. Associated symptoms comments: Headache typical of migraine history that started yesterday. She has taken ibuprofen without relief. Nausea without vomiting, positive photophobia. .    Past Medical History  Diagnosis Date  . Insomnia   . Anxiety   . Anxiety   . Depression   . Polysubstance abuse    Past Surgical History  Procedure Laterality Date  . No past surgeries     History reviewed. No pertinent family history. History  Substance Use Topics  . Smoking status: Current Every Day Smoker -- 1.00 packs/day for 11 years    Types: Cigarettes  . Smokeless tobacco: Never Used  . Alcohol Use: 10.5 oz/week    21 drink(s) per week     Comment: 3 cans of high gravity   OB History   Grav Para Term Preterm Abortions TAB SAB Ect Mult Living                 Review of Systems  Constitutional: Negative for fever and chills.  HENT: Negative for trouble swallowing.   Eyes: Positive for photophobia.   Respiratory: Negative.  Negative for shortness of breath.   Cardiovascular: Negative.   Gastrointestinal: Positive for nausea. Negative for vomiting.  Musculoskeletal: Negative.   Skin: Positive for rash.  Neurological: Positive for headaches. Negative for weakness.      Allergies  Review of patient's allergies indicates no known allergies.  Home Medications   Prior to Admission medications   Medication Sig Start Date End Date Taking? Authorizing Provider  gabapentin (NEURONTIN) 300 MG capsule Take 300 mg by mouth 3 (three) times daily.   Yes Historical Provider, MD  mirtazapine (REMERON) 15 MG tablet Take 10 mg by mouth at bedtime.   Yes Historical Provider, MD  ibuprofen (ADVIL,MOTRIN) 800 MG tablet Take 1 tablet (800 mg total) by mouth 3 (three) times daily. 06/16/14   Elson Areas, PA-C  naproxen sodium (ANAPROX) 220 MG tablet Take 440 mg by mouth daily as needed (pain).    Historical Provider, MD   BP 107/67  Pulse 100  Temp(Src) 98.2 F (36.8 C)  Resp 18  Ht  (1.6 m)  Wt 113 lb (51.256 kg)  BMI 20.02 kg/m2  SpO2 100%  LMP 05/26/2014 Physical Exam  Constitutional: She is oriented to person, place, and time. She appears well-developed and well-nourished.  HENT:  Head: Normocephalic.  Mouth/Throat: Oropharynx is clear and moist.  Eyes: Pupils are equal, round, and reactive to light.  Neck: Normal range of motion. Neck supple.  Cardiovascular: Normal rate and regular  rhythm.   Pulmonary/Chest: Effort normal and breath sounds normal. She has no wheezes. She has no rales.  Abdominal: Soft. Bowel sounds are normal. There is no tenderness. There is no rebound and no guarding.  Musculoskeletal: Normal range of motion.  Neurological: She is alert and oriented to person, place, and time. She has normal strength and normal reflexes. No sensory deficit. She displays a negative Romberg sign. Coordination normal.  Coordination intact, CN's 3-12 grossly intact, speech clear  and focused. No nerulogic deficits.   Skin: Skin is warm and dry. No rash noted.  Red, raised rash in generalized distribution that is erythematous, blanching. C/w drug eruption given history.  Psychiatric: She has a normal mood and affect.    ED Course  Procedures (including critical care time) Labs Review Labs Reviewed - No data to display  Imaging Review No results found.   EKG Interpretation None      MDM   Final diagnoses:  None    1. Drug eruption rash 2. Migraine headache  She appears stable and without compromising symptoms. No neurologic deficits. No airway symptoms with drug rash. Advised to list penicillin as an allergy from now on.   Migraine presentation not concerning. History of the same. Also has an extensive history of polysubstance abuse and overdose. Given migraine cocktail. Stable for discharge.     Arnoldo Hooker, PA-C 06/27/14 [phone]

## 2014-06-27 NOTE — ED Notes (Signed)
PA-C at bedside 

## 2014-06-27 NOTE — ED Notes (Signed)
Pt c/o rash to entire body x 6 days

## 2014-06-28 NOTE — ED Provider Notes (Signed)
Medical screening examination/treatment/procedure(s) were performed by non-physician practitioner and as supervising physician I was immediately available for consultation/collaboration.   EKG Interpretation None        Vanetta Mulders, MD 06/28/14 [phone]

## 2014-11-17 ENCOUNTER — Emergency Department (HOSPITAL_COMMUNITY)
Admission: EM | Admit: 2014-11-17 | Discharge: 2014-11-18 | Disposition: A | Discharge status: Other Acute Inpatient Hospital | Payer: Self-pay | Attending: Emergency Medicine | Admitting: Emergency Medicine

## 2014-11-17 ENCOUNTER — Emergency Department (HOSPITAL_COMMUNITY): Payer: Self-pay

## 2014-11-17 ENCOUNTER — Encounter (HOSPITAL_COMMUNITY): Payer: Self-pay | Admitting: Emergency Medicine

## 2014-11-17 DIAGNOSIS — F419 Anxiety disorder, unspecified: Secondary | ICD-10-CM | POA: Insufficient documentation

## 2014-11-17 DIAGNOSIS — F1210 Cannabis abuse, uncomplicated: Secondary | ICD-10-CM | POA: Insufficient documentation

## 2014-11-17 DIAGNOSIS — R45851 Suicidal ideations: Secondary | ICD-10-CM

## 2014-11-17 DIAGNOSIS — Z79899 Other long term (current) drug therapy: Secondary | ICD-10-CM | POA: Insufficient documentation

## 2014-11-17 DIAGNOSIS — F1410 Cocaine abuse, uncomplicated: Secondary | ICD-10-CM | POA: Insufficient documentation

## 2014-11-17 DIAGNOSIS — F319 Bipolar disorder, unspecified: Secondary | ICD-10-CM | POA: Insufficient documentation

## 2014-11-17 DIAGNOSIS — F1120 Opioid dependence, uncomplicated: Secondary | ICD-10-CM | POA: Insufficient documentation

## 2014-11-17 DIAGNOSIS — G40909 Epilepsy, unspecified, not intractable, without status epilepticus: Secondary | ICD-10-CM | POA: Insufficient documentation

## 2014-11-17 DIAGNOSIS — G4700 Insomnia, unspecified: Secondary | ICD-10-CM | POA: Insufficient documentation

## 2014-11-17 DIAGNOSIS — Z720 Tobacco use: Secondary | ICD-10-CM | POA: Insufficient documentation

## 2014-11-17 HISTORY — DX: Unspecified convulsions: R56.9

## 2014-11-17 LAB — CBC
HEMATOCRIT: 41.5 % (ref 36.0–46.0)
Hemoglobin: 13.6 g/dL (ref 12.0–15.0)
MCH: 31.7 pg (ref 26.0–34.0)
MCHC: 32.8 g/dL (ref 30.0–36.0)
MCV: 96.7 fL (ref 78.0–100.0)
Platelets: 349 10*3/uL (ref 150–400)
RBC: 4.29 MIL/uL (ref 3.87–5.11)
RDW: 12.2 % (ref 11.5–15.5)
WBC: 7.4 10*3/uL (ref 4.0–10.5)

## 2014-11-17 LAB — COMPREHENSIVE METABOLIC PANEL
ALK PHOS: 107 U/L (ref 39–117)
ALT: 17 U/L (ref 0–35)
AST: 27 U/L (ref 0–37)
Albumin: 4.5 g/dL (ref 3.5–5.2)
Anion gap: 6 (ref 5–15)
BUN: 12 mg/dL (ref 6–23)
CO2: 27 mmol/L (ref 19–32)
Calcium: 9.1 mg/dL (ref 8.4–10.5)
Chloride: 107 mmol/L (ref 96–112)
Creatinine, Ser: 0.77 mg/dL (ref 0.50–1.10)
GFR calc Af Amer: >90 mL/min (ref >90)
GFR calc non Af Amer: >90 mL/min (ref >90)
Glucose, Bld: 104 mg/dL — ABNORMAL HIGH (ref 70–99)
POTASSIUM: 3.9 mmol/L (ref 3.5–5.1)
Sodium: 140 mmol/L (ref 135–145)
Total Bilirubin: 0.8 mg/dL (ref 0.3–1.2)
Total Protein: 7.7 g/dL (ref 6.0–8.3)

## 2014-11-17 LAB — I-STAT TROPONIN, ED: Troponin i, poc: 0 ng/mL (ref 0.00–0.08)

## 2014-11-17 LAB — RAPID URINE DRUG SCREEN, HOSP PERFORMED
AMPHETAMINES: NOT DETECTED
BARBITURATES: NOT DETECTED
Benzodiazepines: NOT DETECTED
COCAINE: POSITIVE — AB
OPIATES: POSITIVE — AB
Tetrahydrocannabinol: POSITIVE — AB

## 2014-11-17 LAB — SALICYLATE LEVEL: Salicylate Lvl: <4 mg/dL (ref 2.8–20.0)

## 2014-11-17 LAB — ETHANOL: Alcohol, Ethyl (B): <5 mg/dL (ref 0–9)

## 2014-11-17 LAB — ACETAMINOPHEN LEVEL: Acetaminophen (Tylenol), Serum: <10 ug/mL — ABNORMAL LOW (ref 10–30)

## 2014-11-17 MED ORDER — THIAMINE HCL 100 MG/ML IJ SOLN: 100.0000 mg | Freq: Every day | INTRAMUSCULAR | Status: DC

## 2014-11-17 MED ORDER — CLONIDINE HCL 0.1 MG PO TABS
0.1000 mg | ORAL_TABLET | Freq: Once | ORAL | Status: AC
  Administered 2014-11-17: 0.1 mg via ORAL
  Filled 2014-11-17: qty 1

## 2014-11-17 MED ORDER — NICOTINE 21 MG/24HR TD PT24
21.0000 mg | MEDICATED_PATCH | Freq: Every day | TRANSDERMAL | Status: DC
  Administered 2014-11-17: 21 mg via TRANSDERMAL
  Filled 2014-11-17: qty 1

## 2014-11-17 MED ORDER — ACETAMINOPHEN 325 MG PO TABS: 650.0000 mg | ORAL_TABLET | ORAL | Status: DC | PRN

## 2014-11-17 MED ORDER — LORAZEPAM 1 MG PO TABS: 0.0000 mg | ORAL_TABLET | Freq: Two times a day (BID) | ORAL | Status: DC

## 2014-11-17 MED ORDER — VITAMIN B-1 100 MG PO TABS
100.0000 mg | ORAL_TABLET | Freq: Every day | ORAL | Status: DC
  Administered 2014-11-17: 100 mg via ORAL
  Filled 2014-11-17: qty 1

## 2014-11-17 MED ORDER — CLONIDINE HCL 0.1 MG PO TABS
0.1000 mg | ORAL_TABLET | Freq: Two times a day (BID) | ORAL | Status: DC
Start: 2014-11-17 — End: 2014-11-18
  Administered 2014-11-17: 0.1 mg via ORAL
  Filled 2014-11-17: qty 1

## 2014-11-17 MED ORDER — ONDANSETRON HCL 4 MG PO TABS: 4.0000 mg | ORAL_TABLET | Freq: Three times a day (TID) | ORAL | Status: DC | PRN

## 2014-11-17 MED ORDER — ALUM & MAG HYDROXIDE-SIMETH 200-200-20 MG/5ML PO SUSP: 30.0000 mL | ORAL | Status: DC | PRN

## 2014-11-17 MED ORDER — ZOLPIDEM TARTRATE 5 MG PO TABS
5.0000 mg | ORAL_TABLET | Freq: Every evening | ORAL | Status: DC | PRN
  Administered 2014-11-17: 5 mg via ORAL
  Filled 2014-11-17: qty 1

## 2014-11-17 MED ORDER — LORAZEPAM 1 MG PO TABS
2.0000 mg | ORAL_TABLET | Freq: Once | ORAL | Status: AC
  Administered 2014-11-17: 2 mg via ORAL
  Filled 2014-11-17: qty 2

## 2014-11-17 MED ORDER — WHITE PETROLATUM GEL
Status: DC | PRN
  Filled 2014-11-17: qty 28.35

## 2014-11-17 MED ORDER — LORAZEPAM 1 MG PO TABS
0.0000 mg | ORAL_TABLET | Freq: Four times a day (QID) | ORAL | Status: DC
  Administered 2014-11-17: 2 mg via ORAL
  Filled 2014-11-17: qty 2

## 2014-11-17 NOTE — ED Notes (Addendum)
Inventory of personal effects sheet has been filled out. Pt and belongings have been wanded.

## 2014-11-17 NOTE — BH Assessment (Signed)
Assessment Note  Rita SpryJessica M Hagemeister is an 34 y.o. female with history of anxiety, depression, and polysubstance abuse. 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 Pt last drink was yesterday and last heroin use was yesterday. SEE ADDITIONAL HISTORY FOR SUBSTANCE ABUSE INFORMATION. Pt states that she is having suicidal thoughts and her plan is to take a lot of pills or overdose.Sts that she can get the pills from a friend. Patient has overdosed 2x in the past. She reports a recent OD in which she was hospitalized at [facility].    Axis I: Depressive Disorder, Anxiety Disorder NOS, Opiate Abuse, Alcohol Abuse.  Axis II: Deferred Axis III:  Past Medical History  Diagnosis Date  . Insomnia   . Anxiety   . Anxiety   . Depression   . Polysubstance abuse   . Seizures    Axis IV: other psychosocial or environmental problems, problems related to social environment, problems with access to health care services and problems with primary support group Axis V: 31-40 impairment in reality testing  Past Medical History:  Past Medical History  Diagnosis Date  . Insomnia   . Anxiety   . Anxiety   . Depression   . Polysubstance abuse   . Seizures     Past Surgical History  Procedure Laterality Date  . No past surgeries      Family History: No family history on file.  Social History:  reports that she has been smoking Cigarettes.  She has a 11 pack-year smoking history. She has never used smokeless tobacco. She reports that she drinks about 10.5 oz of alcohol per week. She reports that she uses illicit drugs (Marijuana, Oxycodone, and Heroin).  Additional Social History:  Alcohol / Drug Use Pain Medications: SEE MAR Prescriptions: SEE MAR Over the Counter: SEE MAR History of alcohol / drug use?: Yes Substance #1 Name of Substance 1: Opiates (Heroin) 1 - Age of First Use: 34 yrs old  1 - Amount (size/oz): 2-3 bags  1 - Frequency: daily  1 - Duration: 4 months  1 - Last Use  / Amount: "24 hrs ago" Substance #2 Name of Substance 2: Alcohol  2 - Age of First Use: 34 yrs old  2 - Amount (size/oz): 5th of vodka  2 - Frequency: daily  2 - Duration: 6 yrs  2 - Last Use / Amount: "24 hrs ago"  CIWA: CIWA-Ar BP: 111/69 mmHg Pulse Rate: 97 Nausea and Vomiting: mild nausea with no vomiting Tactile Disturbances: very mild itching, pins and needles, burning or numbness Tremor: three Auditory Disturbances: not present Paroxysmal Sweats: no sweat visible Visual Disturbances: not present Anxiety: three Headache, Fullness in Head: very mild Agitation: normal activity Orientation and Clouding of Sensorium: oriented and can do serial additions CIWA-Ar Total: 9 COWS:    Allergies: No Known Allergies  Home Medications:  (Not in a hospital admission)  OB/GYN Status:  Patient's last menstrual period was 11/13/2014.  General Assessment Data Location of Assessment: [facility] ED Is this a Tele or Face-to-Face Assessment?: Face-to-Face Is this an Initial Assessment or a Re-assessment for this encounter?: Initial Assessment Living Arrangements: Other (Comment) (homeless; "I sleep on couches") Can pt return to current living arrangement?: No Admission Status: Voluntary Is patient capable of signing voluntary admission?: Yes Transfer from: Acute Hospital Referral Source: Self/Family/Friend     [facility] Living Arrangements: Other (Comment) (homeless; "I sleep on couches") Name of Psychiatrist:  (No psychiatrist ) Name of Therapist:  (  No therapist )     Risk to self with the past 6 months Suicidal Ideation: Yes-Currently Present Suicidal Intent: Yes-Currently Present Is patient at risk for suicide?: Yes Suicidal Plan?: Yes-Currently Present Specify Current Suicidal Plan:  (overdose) Access to Means: Yes Specify Access to Suicidal Means:  (access to a friends pills ) What has been your use of drugs/alcohol within the last 12 months?:  (patient reports  Heroin and Alcohol use ) Previous Attempts/Gestures: Yes How many times?:  (2x's-problems with girls) Other Self Harm Risks:  (none reported) Triggers for Past Attempts: Other (Comment) (relationship; problems with girls) Intentional Self Injurious Behavior: None Family Suicide History: No Recent stressful life event(s): Other (Comment) (relationship problems with girls, proverty, drugs) Persecutory voices/beliefs?: No Depression: Yes Depression Symptoms: Feeling angry/irritable, Feeling worthless/self pity, Loss of interest in usual pleasures, Guilt, Fatigue, Despondent, Insomnia, Tearfulness, Isolating Substance abuse history and/or treatment for substance abuse?: No Suicide prevention information given to non-admitted patients: Not applicable  Risk to Others within the past 6 months Homicidal Ideation: No Thoughts of Harm to Others: No Current Homicidal Intent: No Current Homicidal Plan: No Access to Homicidal Means: No Identified Victim:  (n/a) History of harm to others?: No Assessment of Violence: None Noted Violent Behavior Description:  (patient is calm and cooperative ) Does patient have access to weapons?: No Criminal Charges Pending?: No Describe Pending Criminal Charges:  (n/a) Does patient have a court date: No  Psychosis Hallucinations: None noted Delusions: None noted  Mental Status Report Appear/Hygiene: Disheveled Eye Contact: Good Motor Activity: Freedom of movement Speech: Logical/coherent Level of Consciousness: Alert Mood: Depressed Affect: Appropriate to circumstance Anxiety Level: None Thought Processes: Coherent Judgement: Unimpaired Orientation: Place, Person, Situation, Time Obsessive Compulsive Thoughts/Behaviors: None  Cognitive Functioning Concentration: Decreased Memory: Recent Intact, Remote Intact IQ: Average Insight: Fair Impulse Control: Good Appetite: Fair Weight Loss:  (none reported ) Weight Gain:  (none reported ) Sleep: No  Change Total Hours of Sleep:  (n/a) Vegetative Symptoms: None  ADLScreening [facility] Assessment Services) Patient's cognitive ability adequate to safely complete daily activities?: Yes Patient able to express need for assistance with ADLs?: Yes Independently performs ADLs?: No  Prior Inpatient Therapy Prior Inpatient Therapy: Yes Prior Therapy Dates:  ([facility] -6 months ago) Prior Therapy Facilty/Provider(s):  ([facility] ) Reason for Treatment:  (suicidal ideations and substance abuse)  Prior Outpatient Therapy Prior Outpatient Therapy: No Prior Therapy Dates:  (n/a) Prior Therapy Facilty/Provider(s):  (n/a) Reason for Treatment:  (n/a)  ADL Screening (condition at time of admission) Patient's cognitive ability adequate to safely complete daily activities?: Yes Is the patient deaf or have difficulty hearing?: No Does the patient have difficulty seeing, even when wearing glasses/contacts?: No Does the patient have difficulty concentrating, remembering, or making decisions?: Yes Patient able to express need for assistance with ADLs?: Yes Does the patient have difficulty dressing or bathing?: No Independently performs ADLs?: No Does the patient have difficulty walking or climbing stairs?: No Weakness of Legs: None Weakness of Arms/Hands: None  Home Assistive Devices/Equipment Home Assistive Devices/Equipment: None    Abuse/Neglect Assessment (Assessment to be complete while patient is alone) Physical Abuse: Denies Verbal Abuse: Denies Sexual Abuse: Denies Exploitation of patient/patient's resources: Denies Self-Neglect: Denies Values / Beliefs Cultural Requests During Hospitalization: None Spiritual Requests During Hospitalization: None   Advance Directives (For Healthcare) Does patient have an advance directive?: No Would patient like information on creating an advanced directive?: No - patient declined information    Additional Information 1:1  In Past 12  Months?: No CIRT Risk: No Elopement Risk: No Does patient have medical clearance?: Yes     Disposition:  Disposition Initial Assessment Completed for this Encounter: Yes Disposition of Patient: Inpatient treatment program Nanine Means, NP recommends inpatient treatment. ) Type of inpatient treatment program: Adult (Pending placement )  On Site Evaluation by:   Reviewed with Physician:    Melynda Ripple [facility] 11/17/2014 4:18 PM

## 2014-11-17 NOTE — ED Provider Notes (Signed)
CSN: [id number]     Arrival date & time 11/17/14  1215 History   First MD Initiated Contact with Patient 11/17/14 1401     Chief Complaint  Patient presents with  . Chest Pain  . Suicidal  . Anxiety  . detox      HPI  Expand All Collapse All   Pt c/o central chest pain that started last night. 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 Pt last drink was yesterday and last heroin use was this morning but states that the amount was so little that "it didn't do anything". Pt states that she is having suicidal thoughts and her plan is to take a lot of pills or overdose. Pt states that she has been having n/v/d. Pt very shaky while being triaged.  Patient states she's also had some suicidal thoughts and ideations but no specific plan.        Past Medical History  Diagnosis Date  . Insomnia   . Anxiety   . Anxiety   . Depression   . Polysubstance abuse   . Seizures    Past Surgical History  Procedure Laterality Date  . No past surgeries     No family history on file. History  Substance Use Topics  . Smoking status: Current Every Day Smoker -- 1.00 packs/day for 11 years    Types: Cigarettes  . Smokeless tobacco: Never Used  . Alcohol Use: 10.5 oz/week    21 Standard drinks or equivalent per week     Comment: 3 cans of high gravity   OB History    No data available     Review of Systems  All other systems reviewed and are negative  Allergies  Review of patient's allergies indicates no known allergies.  Home Medications   Prior to Admission medications   Medication Sig Start Date End Date Taking? Authorizing Provider  diphenhydrAMINE (BENADRYL) 25 MG tablet Take 1 tablet (25 mg total) by mouth every 6 (six) hours. Patient not taking: Reported on 11/17/2014 06/27/14   Melvenia BeamShari A Upstill, PA-C  famotidine (PEPCID) 20 MG tablet Take 1 tablet (20 mg total) by mouth 2 (two) times daily. Patient not taking: Reported on 11/17/2014 06/27/14   Melvenia BeamShari A Upstill, PA-C   gabapentin (NEURONTIN) 300 MG capsule Take 300 mg by mouth 3 (three) times daily.    Historical Provider, MD  ibuprofen (ADVIL,MOTRIN) 800 MG tablet Take 1 tablet (800 mg total) by mouth 3 (three) times daily. Patient not taking: Reported on 11/17/2014 06/16/14   Elson AreasLeslie K Sofia, PA-C  mirtazapine (REMERON) 15 MG tablet Take 10 mg by mouth at bedtime.    Historical Provider, MD  naproxen sodium (ANAPROX) 220 MG tablet Take 440 mg by mouth daily as needed (pain).    Historical Provider, MD   BP 104/70 mmHg  Pulse 106  Temp(Src) 98.6 F (37 C) (Oral)  Resp 17  SpO2 100%  LMP 11/13/2014 Physical Exam Physical Exam  Nursing note and vitals reviewed. Constitutional: She is oriented to person, place, and time. She appears well-developed and well-nourished. No distress.  HENT:  Head: Normocephalic and atraumatic.  Eyes: Pupils are equal, round, and reactive to light.  Neck: Normal range of motion.  Cardiovascular: Normal rate and intact distal pulses.   Pulmonary/Chest: No respiratory distress.  Abdominal: Normal appearance. She exhibits no distension.  Musculoskeletal: Normal range of motion.  Neurological: She is alert and oriented to person, place, and time. No cranial nerve deficit.  patient had resting tremor. Skin: Skin is warm and dry. No rash noted.  Psychiatric: She has a normal mood and affect. Her behavior is normal.  some suicidal thoughts and ideations  ED Course  Procedures (including critical care time) Medications  cloNIDine (CATAPRES) tablet 0.1 mg (0.1 mg Oral Given 11/17/14 1440)  LORazepam (ATIVAN) tablet 2 mg (2 mg Oral Given 11/17/14 1440)    Labs Review Labs Reviewed  ACETAMINOPHEN LEVEL - Abnormal; Notable for the following:    Acetaminophen (Tylenol), Serum <10.0 (*)    All other components within normal limits  COMPREHENSIVE METABOLIC PANEL - Abnormal; Notable for the following:    Glucose, Bld 104 (*)    All other components within normal limits  URINE RAPID  DRUG SCREEN (HOSP PERFORMED) - Abnormal; Notable for the following:    Opiates POSITIVE (*)    Cocaine POSITIVE (*)    Tetrahydrocannabinol POSITIVE (*)    All other components within normal limits  CBC  ETHANOL  SALICYLATE LEVEL  I-STAT TROPOININ, ED    Imaging Review No results found.   EKG Interpretation   Date/Time:  Monday November 17 2014 12:24:09 EST Ventricular Rate:  93 PR Interval:  130 QRS Duration: 85 QT Interval:  348 QTC Calculation: 433 R Axis:   75 Text Interpretation:  Sinus rhythm Normal ECG Confirmed by Korbin Mapps  MD,  Naela Nodal ([id number]) on 11/17/2014 12:38:52 PM     We'll treat with clonidine in benzodiazepines.  Transfer to psych ED for further detox and evaluation of suicidal thoughts and ideations. MDM   Final diagnoses:  None        Nelia Shi, MD 11/21/14 1205

## 2014-11-17 NOTE — ED Notes (Signed)
Pt changed into scrubs.  

## 2014-11-17 NOTE — ED Notes (Signed)
Pt belongings are at the nursers station across from the room 16.  Belongings still need to be logged in on inventory sheet.  Will make nurse aware

## 2014-11-17 NOTE — Progress Notes (Signed)
  CARE MANAGEMENT ED NOTE 11/17/2014  Patient:  Carlynn SprySAXON,Merideth M   Account Number:  [id number]  Date Initiated:  11/17/2014  Documentation initiated by:  Radford PaxFERRERO,Azile Minardi  Subjective/Objective Assessment:   Patient presents to Ed with SI planning to overdose on pills     Subjective/Objective Assessment Detail:   Patient with pmhx of substance abuse.  Used heroine this am.     Action/Plan:   Action/Plan Detail:   Anticipated DC Date:       Status Recommendation to Physician:   Result of Recommendation:    Other ED Services  Consult Working Plan    DC Planning Services  Other  PCP issues    Choice offered to / List presented to:            Status of service:  Completed, signed off  ED Comments:   ED Comments Detail:  EDCM spoke to patient at bedside.  Patient greeted [facility] and then fell asleep.   EDCM provide patient [facility], informed patient of services there and walk in times. EDCM also provided patient with list of pcps who accept self pay patients, list of discount pharmacies and websites needymeds.org and GoodRX.com for medication assistance, phone number to inquire about the orange card, phone number to inquire about Mediciad, phone number to inquire about the Affordable Care Act, financial resources in the community such as local churches, [employer], urban ministries, and dental assistance for uninsured patients. This information was placed on patient's bedside table.  No further EDCM needs at this time.

## 2014-11-17 NOTE — Progress Notes (Signed)
CSW faxed pt referral to the following inpatient facilities:  [facility], [facility], [facility], [facility], [facility], [facility], [facility], [facility].  Will continue to seek placement.  Catia Herrick Hartog, LCSWA Disposition staff 11/17/2014 10:46 PM  

## 2014-11-17 NOTE — ED Notes (Signed)
Pt c/o central chest pain that started last night. 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 Pt last drink was yesterday and last heroin use was this morning but states that the amount was so little that "it didn't do anything". Pt states that she is having suicidal thoughts and her plan is to take a lot of pills or overdose.  Pt states that she has been having n/v/d. Pt very shaky while being triaged.

## 2014-11-17 NOTE — ED Notes (Signed)
Pt AAO x 3, no distress noted, remains SI, no specific plan noted, will continue to monitor for safety.

## 2014-11-18 NOTE — BH Assessment (Signed)
Per Rosey Batheresa at [facility]. [facility] has been [facility] and can arrive after 8 am, she will be under the care of Dr. Les Pouarlton, and report can be called to [phone]. [facility] but [facility].    Informed RN of acceptance.    Clista BernhardtNancy Adisynn Suleiman, [facility] Triage Specialist 11/18/2014 3:27 AM

## 2014-11-18 NOTE — ED Notes (Signed)
[phone] report to [employer]ty [employer]

## 2014-11-18 NOTE — ED Notes (Signed)
Called [facility] for transportation 

## 2015-12-30 ENCOUNTER — Encounter (HOSPITAL_COMMUNITY): Payer: Self-pay | Admitting: Emergency Medicine

## 2015-12-30 ENCOUNTER — Emergency Department (HOSPITAL_COMMUNITY)
Admission: EM | Admit: 2015-12-30 | Discharge: 2015-12-30 | Disposition: A | Discharge status: Home / Self Care | Payer: Self-pay | Attending: Emergency Medicine | Admitting: Emergency Medicine

## 2015-12-30 DIAGNOSIS — F17210 Nicotine dependence, cigarettes, uncomplicated: Secondary | ICD-10-CM | POA: Insufficient documentation

## 2015-12-30 DIAGNOSIS — F419 Anxiety disorder, unspecified: Secondary | ICD-10-CM | POA: Insufficient documentation

## 2015-12-30 DIAGNOSIS — G4700 Insomnia, unspecified: Secondary | ICD-10-CM | POA: Insufficient documentation

## 2015-12-30 DIAGNOSIS — F329 Major depressive disorder, single episode, unspecified: Secondary | ICD-10-CM | POA: Insufficient documentation

## 2015-12-30 DIAGNOSIS — F1010 Alcohol abuse, uncomplicated: Secondary | ICD-10-CM | POA: Insufficient documentation

## 2015-12-30 DIAGNOSIS — Z79899 Other long term (current) drug therapy: Secondary | ICD-10-CM | POA: Insufficient documentation

## 2015-12-30 DIAGNOSIS — F1011 Alcohol abuse, in remission: Secondary | ICD-10-CM

## 2015-12-30 LAB — ETHANOL

## 2015-12-30 NOTE — Discharge Instructions (Signed)
Your alcohol level has been tested in the ER and it is undetectable.    Ethanol Test WHY AM I HAVING THIS TEST? This test is used to determine whether your blood alcohol level can impair your ability to drive. It can also determine the possibility of overdose. WHAT KIND OF SAMPLE IS TAKEN? A blood sample is required for this test. It is usually collected by inserting a needle into a vein. HOW DO I PREPARE FOR THE TEST? There is no preparation required for this test. WHAT ARE THE REFERENCE RANGES? Reference ranges are considered healthy ranges established after testing a large group of healthy people. Reference ranges may vary among different people, labs, and hospitals. It is your responsibility to obtain your test results. Ask the lab or department performing the test when and how you will get your test results. Reference ranges are 0-50 mg/dL or 1-0.93%0-0.05%. WHAT DO THE RESULTS MEAN? If your results are:  Greater than or equal to 80 mg/dL or 2.35%0.08%, this indicates legal intoxication.  80-400 mg/dL or 5.73-2.2%0.08-0.4%, this means that you have increased intoxication. You may also have depressed central nervous response.  Greater than 400 mg/dL or 0.2%0.4%, this indicates possible alcohol overdose. Talk with your health care provider to discuss your results, treatment options, and if necessary, the need for more tests. Talk with your health care provider if you have any questions about your results.   This information is not intended to replace advice given to you by your health care provider. Make sure you discuss any questions you have with your health care provider.   Document Released: 10/19/2004 Document Revised: 10/17/2014 Document Reviewed: 03/18/2014 Elsevier Interactive Patient Education [employer]2016 Elsevier Inc.

## 2015-12-30 NOTE — ED Provider Notes (Signed)
CSN: [id number]     Arrival date & time 12/30/15  1639 History  By signing my name below, I, Rita Carey, attest that this documentation has been prepared under the direction and in the presence of PA. Electronically Signed: Budd PalmerVanessa Carey, ED Scribe. 12/30/2015. 7:13 PM.    Chief Complaint  Patient presents with  . alcohol level    The history is provided by the patient. No language interpreter was used.   HPI Comments: Rita Carey is a 35 y.o. female smoker at 1 ppd with a PMHx of polysubstance abuse who presents to the Emergency Department to obtain an alcohol-level test. Pt states she was at a methadone clinic for 8 months and came up with 3 different EtOH positive urinalysis test results, but states she has not been drinking on a regular basis. She notes that during that time she did take Nyquil, which she was told might show a false positive. She is now transferring to a new methadone clinic ([facility]) where she was seen today and who required her to have a urinalysis done by the ED, as their test will take several weeks, and pt cannot wait that long. She notes she is on 98 mg of methadone and has not had a dose today. She reports that the last time she drank was 1 week ago. She states she is in school full-time and doing well in all of her classes.   Past Medical History  Diagnosis Date  . Insomnia   . Anxiety   . Anxiety   . Depression   . Polysubstance abuse   . Seizures [facility](HCC)    Past Surgical History  Procedure Laterality Date  . No past surgeries     No family history on file. Social History  Substance Use Topics  . Smoking status: Current Every Day Smoker -- 1.00 packs/day for 11 years    Types: Cigarettes  . Smokeless tobacco: Never Used  . Alcohol Use: 10.5 oz/week    21 Standard drinks or equivalent per week     Comment: 3 cans of high gravity   OB History    No data available     Review of Systems  Constitutional: Negative for fever.  Skin: Negative  for wound.    Allergies  Review of patient's allergies indicates no known allergies.  Home Medications   Prior to Admission medications   Medication Sig Start Date End Date Taking? Authorizing Provider  diphenhydrAMINE (BENADRYL) 25 MG tablet Take 1 tablet (25 mg total) by mouth every 6 (six) hours. Patient not taking: Reported on 11/17/2014 06/27/14   Elpidio AnisShari Upstill, PA-C  famotidine (PEPCID) 20 MG tablet Take 1 tablet (20 mg total) by mouth 2 (two) times daily. Patient not taking: Reported on 11/17/2014 06/27/14   Elpidio AnisShari Upstill, PA-C  gabapentin (NEURONTIN) 300 MG capsule Take 300 mg by mouth 3 (three) times daily.    Historical Provider, MD  ibuprofen (ADVIL,MOTRIN) 800 MG tablet Take 1 tablet (800 mg total) by mouth 3 (three) times daily. Patient not taking: Reported on 11/17/2014 06/16/14   Elson AreasLeslie K Sofia, PA-C  mirtazapine (REMERON) 15 MG tablet Take 10 mg by mouth at bedtime.    Historical Provider, MD  naproxen sodium (ANAPROX) 220 MG tablet Take 440 mg by mouth daily as needed (pain).    Historical Provider, MD   BP 117/91 mmHg  Pulse 100  Temp(Src) 97.1 F (36.2 C) (Oral)  Resp 18  SpO2 100%  LMP 12/26/2015 Physical Exam  Constitutional:  She is oriented to person, place, and time. She appears well-developed and well-nourished.  HENT:  Head: Normocephalic and atraumatic.  Eyes: Conjunctivae are normal. Right eye exhibits no discharge. Left eye exhibits no discharge.  Cardiovascular: Normal rate, regular rhythm and normal heart sounds.  Exam reveals no gallop and no friction rub.   No murmur heard. Pulmonary/Chest: Effort normal and breath sounds normal. No respiratory distress.  Abdominal: Soft. There is no tenderness.  Neurological: She is alert and oriented to person, place, and time. Coordination normal.  Skin: Skin is warm and dry. No rash noted. She is not diaphoretic. No erythema.  Psychiatric: She has a normal mood and affect. Her speech is normal and behavior is normal.  Judgment and thought content normal. Cognition and memory are normal.  Nursing note and vitals reviewed.   ED Course  Procedures  DIAGNOSTIC STUDIES: Oxygen Saturation is 100% on RA, normal by my interpretation.    COORDINATION OF CARE: 7:10 PM - Discussed plans to . Pt advised of plan for treatment and pt agrees.  Labs Review Labs Reviewed  ETHANOL    Imaging Review No results found. I have personally reviewed and evaluated these images and lab results as part of my medical decision-making.   EKG Interpretation None      MDM   Final diagnoses:  History of alcohol abuse    BP 117/91 mmHg  Pulse 100  Temp(Src) 97.1 F (36.2 C) (Oral)  Resp 18  SpO2 100%  LMP 12/26/2015   I personally performed the services described in this documentation, which was scribed in my presence. The recorded information has been reviewed and is accurate.     Fayrene Helper, PA-C 12/30/15 1914  Melene Plan, DO 12/30/15 1928

## 2015-12-30 NOTE — ED Notes (Signed)
Pt states she needs an alcohol level done so she can get into another methadone clinic

## 2016-06-14 ENCOUNTER — Emergency Department (HOSPITAL_COMMUNITY): Payer: BLUE CROSS/BLUE SHIELD

## 2016-06-14 ENCOUNTER — Emergency Department (HOSPITAL_COMMUNITY)
Admission: EM | Admit: 2016-06-14 | Discharge: 2016-06-14 | Disposition: A | Discharge status: Home / Self Care | Payer: BLUE CROSS/BLUE SHIELD | Attending: Emergency Medicine | Admitting: Emergency Medicine

## 2016-06-14 ENCOUNTER — Encounter (HOSPITAL_COMMUNITY): Payer: Self-pay

## 2016-06-14 DIAGNOSIS — R109 Unspecified abdominal pain: Secondary | ICD-10-CM | POA: Diagnosis not present

## 2016-06-14 DIAGNOSIS — Y9289 Other specified places as the place of occurrence of the external cause: Secondary | ICD-10-CM | POA: Insufficient documentation

## 2016-06-14 DIAGNOSIS — F17210 Nicotine dependence, cigarettes, uncomplicated: Secondary | ICD-10-CM | POA: Insufficient documentation

## 2016-06-14 DIAGNOSIS — R102 Pelvic and perineal pain: Secondary | ICD-10-CM | POA: Insufficient documentation

## 2016-06-14 DIAGNOSIS — Y939 Activity, unspecified: Secondary | ICD-10-CM | POA: Diagnosis not present

## 2016-06-14 DIAGNOSIS — Z79899 Other long term (current) drug therapy: Secondary | ICD-10-CM | POA: Diagnosis not present

## 2016-06-14 DIAGNOSIS — S70211A Abrasion, right hip, initial encounter: Secondary | ICD-10-CM | POA: Insufficient documentation

## 2016-06-14 DIAGNOSIS — Y999 Unspecified external cause status: Secondary | ICD-10-CM | POA: Diagnosis not present

## 2016-06-14 DIAGNOSIS — S79911A Unspecified injury of right hip, initial encounter: Secondary | ICD-10-CM | POA: Diagnosis present

## 2016-06-14 LAB — COMPREHENSIVE METABOLIC PANEL
ALT: 14 U/L (ref 14–54)
AST: 25 U/L (ref 15–41)
Albumin: 3.3 g/dL — ABNORMAL LOW (ref 3.5–5.0)
Alkaline Phosphatase: 69 U/L (ref 38–126)
Anion gap: 5 (ref 5–15)
BUN: 7 mg/dL (ref 6–20)
CALCIUM: 8.6 mg/dL — AB (ref 8.9–10.3)
CO2: 25 mmol/L (ref 22–32)
CREATININE: 0.82 mg/dL (ref 0.44–1.00)
Chloride: 109 mmol/L (ref 101–111)
Glucose, Bld: 86 mg/dL (ref 65–99)
Potassium: 3.8 mmol/L (ref 3.5–5.1)
Sodium: 139 mmol/L (ref 135–145)
Total Bilirubin: 0.4 mg/dL (ref 0.3–1.2)
Total Protein: 5.9 g/dL — ABNORMAL LOW (ref 6.5–8.1)

## 2016-06-14 LAB — I-STAT BETA HCG BLOOD, ED (MC, WL, AP ONLY): HCG, QUANTITATIVE: 6.2 m[IU]/mL — AB (ref <5)

## 2016-06-14 LAB — CBC
HCT: 36.4 % (ref 36.0–46.0)
Hemoglobin: 12.1 g/dL (ref 12.0–15.0)
MCH: 31.8 pg (ref 26.0–34.0)
MCHC: 33.2 g/dL (ref 30.0–36.0)
MCV: 95.5 fL (ref 78.0–100.0)
PLATELETS: 198 10*3/uL (ref 150–400)
RBC: 3.81 MIL/uL — AB (ref 3.87–5.11)
RDW: 12 % (ref 11.5–15.5)
WBC: 8.6 10*3/uL (ref 4.0–10.5)

## 2016-06-14 LAB — HCG, SERUM, QUALITATIVE: PREG SERUM: NEGATIVE

## 2016-06-14 LAB — HCG, QUANTITATIVE, PREGNANCY: hCG, Beta Chain, Quant, S: 1 m[IU]/mL (ref <5)

## 2016-06-14 LAB — ETHANOL: Alcohol, Ethyl (B): 15 mg/dL — ABNORMAL HIGH (ref <5)

## 2016-06-14 MED ORDER — CYCLOBENZAPRINE HCL 10 MG PO TABS: 10.0000 mg | ORAL_TABLET | Freq: Two times a day (BID) | ORAL | 0 refills | Status: DC | PRN

## 2016-06-14 MED ORDER — SODIUM CHLORIDE 0.9 % IV SOLN
1000.0000 mL | INTRAVENOUS | Status: DC
  Administered 2016-06-14: 1000 mL via INTRAVENOUS

## 2016-06-14 MED ORDER — LORAZEPAM 2 MG/ML IJ SOLN
INTRAMUSCULAR | Status: DC
Start: 2016-06-14 — End: 2016-06-14
  Filled 2016-06-14: qty 1

## 2016-06-14 MED ORDER — KETOROLAC TROMETHAMINE 15 MG/ML IJ SOLN
15.0000 mg | Freq: Once | INTRAMUSCULAR | Status: AC
  Administered 2016-06-14: 15 mg via INTRAVENOUS
  Filled 2016-06-14: qty 1

## 2016-06-14 MED ORDER — SODIUM CHLORIDE 0.9 % IV SOLN
1000.0000 mL | Freq: Once | INTRAVENOUS | Status: AC
  Administered 2016-06-14: 1000 mL via INTRAVENOUS

## 2016-06-14 MED ORDER — FENTANYL CITRATE (PF) 100 MCG/2ML IJ SOLN
50.0000 ug | Freq: Once | INTRAMUSCULAR | Status: AC
  Administered 2016-06-14: 50 ug via INTRAVENOUS

## 2016-06-14 MED ORDER — FENTANYL CITRATE (PF) 100 MCG/2ML IJ SOLN
INTRAMUSCULAR | Status: AC
  Filled 2016-06-14: qty 2

## 2016-06-14 MED ORDER — LORAZEPAM 2 MG/ML IJ SOLN
2.0000 mg | Freq: Once | INTRAMUSCULAR | Status: DC
  Filled 2016-06-14: qty 1

## 2016-06-14 MED ORDER — FENTANYL CITRATE (PF) 100 MCG/2ML IJ SOLN
100.0000 ug | Freq: Once | INTRAMUSCULAR | Status: AC
  Administered 2016-06-14: 100 ug via INTRAVENOUS
  Filled 2016-06-14: qty 2

## 2016-06-14 MED ORDER — IOPAMIDOL (ISOVUE-300) INJECTION 61%
INTRAVENOUS | Status: AC
Start: 2016-06-14 — End: 2016-06-14
  Administered 2016-06-14: 100 mL
  Filled 2016-06-14: qty 100

## 2016-06-14 NOTE — ED Notes (Signed)
Patient transported to CT 

## 2016-06-14 NOTE — ED Notes (Signed)
Pt. Requesting pain medicine and anxiety medicine. EDP made aware.

## 2016-06-14 NOTE — ED Notes (Signed)
Pt returned to room from imaging dept.  

## 2016-06-14 NOTE — ED Notes (Signed)
Pt. Requesting pain medication. EDP made aware. Pt. Noted to be asleep upon RN return. Pt. Woken up and updated that EDP would be in shortly to speak with her.

## 2016-06-14 NOTE — ED Notes (Signed)
Pt requesting that C-collar be removed; causing pain; pt tearful; [facility]

## 2016-06-14 NOTE — ED Notes (Signed)
Pt. Friend came out of room yelling that the pt. Needed help. RN came in to find patient shaking. RN sternal rubbed patient and pt. Was alert and started crying. EDP at bedside at this time. Ativan returned to pyxis and not given.
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
## 2016-06-14 NOTE — ED Notes (Signed)
C-collar removed per Patria Maneampos, MD verbal order; Pt requesting something stronger than toradol
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
## 2016-08-15 ENCOUNTER — Emergency Department (HOSPITAL_COMMUNITY)
Admission: EM | Admit: 2016-08-15 | Discharge: 2016-08-16 | Disposition: A | Discharge status: Home / Self Care | Payer: BLUE CROSS/BLUE SHIELD | Attending: Emergency Medicine | Admitting: Emergency Medicine

## 2016-08-15 ENCOUNTER — Encounter (HOSPITAL_COMMUNITY): Payer: Self-pay | Admitting: Emergency Medicine

## 2016-08-15 DIAGNOSIS — Y929 Unspecified place or not applicable: Secondary | ICD-10-CM | POA: Diagnosis not present

## 2016-08-15 DIAGNOSIS — S6991XA Unspecified injury of right wrist, hand and finger(s), initial encounter: Secondary | ICD-10-CM | POA: Diagnosis present

## 2016-08-15 DIAGNOSIS — Y939 Activity, unspecified: Secondary | ICD-10-CM | POA: Insufficient documentation

## 2016-08-15 DIAGNOSIS — S60811A Abrasion of right wrist, initial encounter: Secondary | ICD-10-CM | POA: Insufficient documentation

## 2016-08-15 DIAGNOSIS — X58XXXA Exposure to other specified factors, initial encounter: Secondary | ICD-10-CM | POA: Insufficient documentation

## 2016-08-15 DIAGNOSIS — S60819A Abrasion of unspecified wrist, initial encounter: Secondary | ICD-10-CM

## 2016-08-15 DIAGNOSIS — Y999 Unspecified external cause status: Secondary | ICD-10-CM | POA: Diagnosis not present

## 2016-08-15 DIAGNOSIS — S60812A Abrasion of left wrist, initial encounter: Secondary | ICD-10-CM | POA: Insufficient documentation

## 2016-08-15 DIAGNOSIS — F17210 Nicotine dependence, cigarettes, uncomplicated: Secondary | ICD-10-CM | POA: Diagnosis not present

## 2016-08-15 DIAGNOSIS — M542 Cervicalgia: Secondary | ICD-10-CM | POA: Diagnosis not present
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
## 2016-08-15 NOTE — ED Triage Notes (Addendum)
Pt to ED via [facility] with reports of pt being in police custody when she started to shake.all over.  [place] [employer] pulled over and called EMS.  No oral trauma present and pt not incontinent of urine  Pt \\c /o bil wrist pain from hand cuffs, neck and back pain

## 2016-11-25 ENCOUNTER — Encounter (HOSPITAL_COMMUNITY): Payer: Self-pay | Admitting: Nurse Practitioner

## 2016-11-25 ENCOUNTER — Emergency Department (HOSPITAL_COMMUNITY): Payer: BLUE CROSS/BLUE SHIELD

## 2016-11-25 ENCOUNTER — Emergency Department (HOSPITAL_COMMUNITY)
Admission: EM | Admit: 2016-11-25 | Discharge: 2016-11-25 | Disposition: A | Discharge status: Home / Self Care | Payer: BLUE CROSS/BLUE SHIELD | Attending: Emergency Medicine | Admitting: Emergency Medicine

## 2016-11-25 DIAGNOSIS — F17210 Nicotine dependence, cigarettes, uncomplicated: Secondary | ICD-10-CM | POA: Insufficient documentation

## 2016-11-25 DIAGNOSIS — Z5321 Procedure and treatment not carried out due to patient leaving prior to being seen by health care provider: Secondary | ICD-10-CM | POA: Insufficient documentation

## 2016-11-25 DIAGNOSIS — R0781 Pleurodynia: Secondary | ICD-10-CM | POA: Insufficient documentation
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
## 2016-11-25 NOTE — ED Notes (Signed)
Pt called 3 times in waiting room to go to room and no response.

## 2017-12-26 ENCOUNTER — Emergency Department (HOSPITAL_COMMUNITY)
Admission: EM | Admit: 2017-12-26 | Discharge: 2017-12-26 | Disposition: A | Discharge status: Home / Self Care | Payer: BLUE CROSS/BLUE SHIELD | Attending: Emergency Medicine | Admitting: Emergency Medicine

## 2017-12-26 ENCOUNTER — Encounter (HOSPITAL_COMMUNITY): Payer: Self-pay

## 2017-12-26 ENCOUNTER — Other Ambulatory Visit: Payer: Self-pay

## 2017-12-26 DIAGNOSIS — F17210 Nicotine dependence, cigarettes, uncomplicated: Secondary | ICD-10-CM | POA: Diagnosis not present

## 2017-12-26 DIAGNOSIS — R451 Restlessness and agitation: Secondary | ICD-10-CM | POA: Diagnosis not present

## 2017-12-26 DIAGNOSIS — Z79899 Other long term (current) drug therapy: Secondary | ICD-10-CM | POA: Insufficient documentation

## 2017-12-26 DIAGNOSIS — Z760 Encounter for issue of repeat prescription: Secondary | ICD-10-CM | POA: Diagnosis not present

## 2017-12-26 DIAGNOSIS — R002 Palpitations: Secondary | ICD-10-CM | POA: Diagnosis not present

## 2017-12-26 MED ORDER — ALPRAZOLAM 0.5 MG PO TABS
1.0000 mg | ORAL_TABLET | Freq: Once | ORAL | Status: AC
  Administered 2017-12-26: 1 mg via ORAL
  Filled 2017-12-26: qty 2

## 2017-12-26 MED ORDER — HYDROXYZINE HCL 25 MG PO TABS
50.0000 mg | ORAL_TABLET | Freq: Once | ORAL | Status: AC
  Administered 2017-12-26: 50 mg via ORAL
  Filled 2017-12-26: qty 2

## 2017-12-26 MED ORDER — ALPRAZOLAM 1 MG PO TABS: 1.0000 mg | ORAL_TABLET | Freq: Three times a day (TID) | ORAL | 0 refills | Status: DC
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
## 2017-12-26 NOTE — Discharge Instructions (Signed)
Call your doctor in the morning and tell them that the emergency department does not do medication refills for controlled substances. You primary health care provider will need to do any further medication refill or adjustments.

## 2018-08-08 ENCOUNTER — Emergency Department (HOSPITAL_COMMUNITY)
Admission: EM | Admit: 2018-08-08 | Discharge: 2018-08-08 | Disposition: A | Discharge status: Home / Self Care | Payer: BLUE CROSS/BLUE SHIELD | Attending: Emergency Medicine | Admitting: Emergency Medicine

## 2018-08-08 DIAGNOSIS — F17210 Nicotine dependence, cigarettes, uncomplicated: Secondary | ICD-10-CM | POA: Diagnosis not present

## 2018-08-08 DIAGNOSIS — F13239 Sedative, hypnotic or anxiolytic dependence with withdrawal, unspecified: Secondary | ICD-10-CM | POA: Insufficient documentation

## 2018-08-08 DIAGNOSIS — Z79899 Other long term (current) drug therapy: Secondary | ICD-10-CM | POA: Insufficient documentation

## 2018-08-08 DIAGNOSIS — F13939 Sedative, hypnotic or anxiolytic use, unspecified with withdrawal, unspecified: Secondary | ICD-10-CM

## 2018-08-08 DIAGNOSIS — R569 Unspecified convulsions: Secondary | ICD-10-CM | POA: Insufficient documentation

## 2018-08-08 LAB — CBC
HEMATOCRIT: 36.9 % (ref 36.0–46.0)
Hemoglobin: 12.2 g/dL (ref 12.0–15.0)
MCH: 31.2 pg (ref 26.0–34.0)
MCHC: 33.1 g/dL (ref 30.0–36.0)
MCV: 94.4 fL (ref 80.0–100.0)
Platelets: 231 10*3/uL (ref 150–400)
RBC: 3.91 MIL/uL (ref 3.87–5.11)
RDW: 11.9 % (ref 11.5–15.5)
WBC: 10.2 10*3/uL (ref 4.0–10.5)
nRBC: 0 % (ref 0.0–0.2)

## 2018-08-08 LAB — BASIC METABOLIC PANEL
Anion gap: 6 (ref 5–15)
BUN: 13 mg/dL (ref 6–20)
CALCIUM: 8.1 mg/dL — AB (ref 8.9–10.3)
CO2: 24 mmol/L (ref 22–32)
CREATININE: 0.7 mg/dL (ref 0.44–1.00)
Chloride: 106 mmol/L (ref 98–111)
GFR calc non Af Amer: >60 mL/min (ref >60)
GLUCOSE: 111 mg/dL — AB (ref 70–99)
Potassium: 3.5 mmol/L (ref 3.5–5.1)
Sodium: 136 mmol/L (ref 135–145)

## 2018-08-08 LAB — RAPID URINE DRUG SCREEN, HOSP PERFORMED
Amphetamines: NOT DETECTED
BARBITURATES: NOT DETECTED
Benzodiazepines: POSITIVE — AB
COCAINE: NOT DETECTED
Opiates: NOT DETECTED
TETRAHYDROCANNABINOL: NOT DETECTED

## 2018-08-08 LAB — CBG MONITORING, ED: GLUCOSE-CAPILLARY: 143 mg/dL — AB (ref 70–99)

## 2018-08-08 MED ORDER — ALPRAZOLAM 0.5 MG PO TABS
1.0000 mg | ORAL_TABLET | Freq: Once | ORAL | Status: AC
Start: 2018-08-08 — End: 2018-08-08
  Administered 2018-08-08: 1 mg via ORAL
  Filled 2018-08-08: qty 2

## 2018-08-08 MED ORDER — ALPRAZOLAM 1 MG PO TABS: 1.0000 mg | ORAL_TABLET | Freq: Three times a day (TID) | ORAL | 0 refills | Status: DC

## 2018-08-08 NOTE — Discharge Instructions (Addendum)
Continue taking home medications as prescribed. Call your doctor afternoon/tomorrow for medication refill. Return to the emergency room with any new, worsening, or concerning symptoms.

## 2018-08-08 NOTE — ED Notes (Signed)
Family at bedside. 
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
## 2018-08-08 NOTE — ED Notes (Signed)
Pt attempting to void.  

## 2019-02-19 ENCOUNTER — Ambulatory Visit (INDEPENDENT_AMBULATORY_CARE_PROVIDER_SITE_OTHER): Payer: BLUE CROSS/BLUE SHIELD | Admitting: Internal Medicine

## 2019-02-19 ENCOUNTER — Other Ambulatory Visit: Payer: Self-pay

## 2019-02-19 DIAGNOSIS — F411 Generalized anxiety disorder: Secondary | ICD-10-CM | POA: Diagnosis not present

## 2019-02-19 DIAGNOSIS — F1320 Sedative, hypnotic or anxiolytic dependence, uncomplicated: Secondary | ICD-10-CM

## 2019-02-19 DIAGNOSIS — F410 Panic disorder [episodic paroxysmal anxiety] without agoraphobia: Secondary | ICD-10-CM

## 2019-02-19 DIAGNOSIS — F319 Bipolar disorder, unspecified: Secondary | ICD-10-CM
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
## 2019-03-17 ENCOUNTER — Emergency Department (HOSPITAL_COMMUNITY)
Admission: EM | Admit: 2019-03-17 | Discharge: 2019-03-17 | Disposition: A | Discharge status: Home / Self Care | Payer: BC Managed Care – PPO | Attending: Emergency Medicine | Admitting: Emergency Medicine

## 2019-03-17 ENCOUNTER — Other Ambulatory Visit: Payer: Self-pay

## 2019-03-17 ENCOUNTER — Encounter (HOSPITAL_COMMUNITY): Payer: Self-pay

## 2019-03-17 ENCOUNTER — Emergency Department (HOSPITAL_COMMUNITY): Payer: BC Managed Care – PPO

## 2019-03-17 DIAGNOSIS — R569 Unspecified convulsions: Secondary | ICD-10-CM | POA: Diagnosis present

## 2019-03-17 DIAGNOSIS — F17210 Nicotine dependence, cigarettes, uncomplicated: Secondary | ICD-10-CM | POA: Diagnosis not present

## 2019-03-17 DIAGNOSIS — T50904A Poisoning by unspecified drugs, medicaments and biological substances, undetermined, initial encounter: Secondary | ICD-10-CM | POA: Insufficient documentation

## 2019-03-17 LAB — COMPREHENSIVE METABOLIC PANEL
ALT: 30 U/L (ref 0–44)
AST: 47 U/L — ABNORMAL HIGH (ref 15–41)
Albumin: 4 g/dL (ref 3.5–5.0)
Alkaline Phosphatase: 81 U/L (ref 38–126)
Anion gap: 10 (ref 5–15)
BUN: 11 mg/dL (ref 6–20)
CO2: 24 mmol/L (ref 22–32)
Calcium: 8.6 mg/dL — ABNORMAL LOW (ref 8.9–10.3)
Chloride: 104 mmol/L (ref 98–111)
Creatinine, Ser: 0.73 mg/dL (ref 0.44–1.00)
GFR calc Af Amer: >60 mL/min (ref >60)
GFR calc non Af Amer: >60 mL/min (ref >60)
Glucose, Bld: 92 mg/dL (ref 70–99)
Potassium: 3.5 mmol/L (ref 3.5–5.1)
Sodium: 138 mmol/L (ref 135–145)
Total Bilirubin: 0.5 mg/dL (ref 0.3–1.2)
Total Protein: 7 g/dL (ref 6.5–8.1)

## 2019-03-17 LAB — SALICYLATE LEVEL: Salicylate Lvl: <7 mg/dL (ref 2.8–30.0)

## 2019-03-17 LAB — CBC
HCT: 36.2 % (ref 36.0–46.0)
Hemoglobin: 11.5 g/dL — ABNORMAL LOW (ref 12.0–15.0)
MCH: 31.3 pg (ref 26.0–34.0)
MCHC: 31.8 g/dL (ref 30.0–36.0)
MCV: 98.6 fL (ref 80.0–100.0)
Platelets: 175 10*3/uL (ref 150–400)
RBC: 3.67 MIL/uL — ABNORMAL LOW (ref 3.87–5.11)
RDW: 11.5 % (ref 11.5–15.5)
WBC: 4.4 10*3/uL (ref 4.0–10.5)
nRBC: 0 % (ref 0.0–0.2)

## 2019-03-17 LAB — LACTIC ACID, PLASMA: Lactic Acid, Venous: 2.7 mmol/L (ref 0.5–1.9)

## 2019-03-17 LAB — ETHANOL: Alcohol, Ethyl (B): 164 mg/dL — ABNORMAL HIGH (ref <10)

## 2019-03-17 LAB — I-STAT BETA HCG BLOOD, ED (MC, WL, AP ONLY): I-stat hCG, quantitative: <5 m[IU]/mL (ref <5)

## 2019-03-17 LAB — ACETAMINOPHEN LEVEL: Acetaminophen (Tylenol), Serum: <10 ug/mL — ABNORMAL LOW (ref 10–30)

## 2019-03-17 MED ORDER — ONDANSETRON HCL 4 MG/2ML IJ SOLN
4.0000 mg | Freq: Once | INTRAMUSCULAR | Status: AC
  Administered 2019-03-17: 4 mg via INTRAVENOUS
  Filled 2019-03-17: qty 2

## 2019-03-17 MED ORDER — SODIUM CHLORIDE 0.9 % IV BOLUS
1000.0000 mL | Freq: Once | INTRAVENOUS | Status: AC
  Administered 2019-03-17: 1000 mL via INTRAVENOUS

## 2019-03-17 MED ORDER — NALOXONE HCL 0.4 MG/ML IJ SOLN
0.2000 mg | Freq: Once | INTRAMUSCULAR | Status: AC
Start: 2019-03-17 — End: 2019-03-17
  Administered 2019-03-17: 0.2 mg via INTRAVENOUS
  Filled 2019-03-17: qty 1

## 2019-03-17 NOTE — ED Notes (Signed)
ED Provider at bedside. 

## 2019-03-17 NOTE — ED Notes (Signed)
EMS reported to RN that pt was alert and oriented with EMS. Upon entry to pt room, pt nonresponsive. Dr. Sedonia Small bedside. Narcan 0.2 mg given as ordered. Pt alert within 60 sec. Dr. Sedonia Small aware. Pt states she has not taken any meds or drugs of any kind. Pt able to tell RN the day and where she is. Will continue to monitor.

## 2019-03-17 NOTE — Discharge Instructions (Addendum)
You were evaluated in the Emergency Department and after careful evaluation, we did not find any emergent condition requiring admission or further testing in the hospital. ° °Please return to the Emergency Department if you experience any worsening of your condition.  We encourage you to follow up with a primary care provider.  Thank you for allowing us to be a part of your care. °

## 2019-03-17 NOTE — ED Notes (Signed)
CRITICAL VALUE STICKER  CRITICAL VALUE: lactic 2.7  DATE & TIME NOTIFIED: 6/7 1332  MD NOTIFIED: Dr. Sedonia Small  TIME OF NOTIFICATION: 6/7 1334

## 2019-03-17 NOTE — ED Triage Notes (Addendum)
Pt BIB EMS home. Pt found unresponsive upon arrival- EMS reports pt postictal. Pt walked with EMS and had seizure with EMS 45 sec. Afterwards pt alert and oriented with EMS. Pt reports not taking any medications.

## 2019-03-17 NOTE — ED Provider Notes (Signed)
[facility] Emergency Department Provider Note MRN:  [id number]  Arrival date & time: 03/17/19     Chief Complaint   Seizures   History of Present Illness   Rita Carey is a 38 y.o. year-old female with a history of polysubstance abuse, seizures presenting to the ED with chief complaint of seizure.  Report of seizure activity prior to EMS arrival.  Had another tonic-clonic seizure with EMS lasting an estimated 45 seconds.  No medications given by EMS, patient was reportedly postictal but then conversant upon arrival to the emergency department.  On my evaluation patient is somnolent and unresponsive.  I was unable to obtain an accurate HPI, PMH, or ROS due to the patient's unresponsiveness.  Review of Systems  Positive for seizure, unresponsiveness.  Patient's Health History    Past Medical History:  Diagnosis Date  . Anxiety   . Anxiety   . Depression   . Insomnia   . Polysubstance abuse ([place])   . Seizures ([place])     Past Surgical History:  Procedure Laterality Date  . NO PAST SURGERIES    . SHOULDER SURGERY    . WISDOM TOOTH EXTRACTION      History reviewed. No pertinent family history.  Social History   Socioeconomic History  . Marital status: Single    Spouse name: Not on file  . Number of children: Not on file  . Years of education: Not on file  . Highest education level: Not on file  Occupational History  . Not on file  Social Needs  . Financial resource strain: Not on file  . Food insecurity:    Worry: Not on file    Inability: Not on file  . Transportation needs:    Medical: Not on file    Non-medical: Not on file  Tobacco Use  . Smoking status: Current Every Day Smoker    Packs/day: 1.00    Years: 11.00    Pack years: 11.00    Types: Cigarettes  . Smokeless tobacco: Never Used  Substance and Sexual Activity  . Alcohol use: Yes    Alcohol/week: 21.0 standard drinks    Types: 21 Standard drinks or equivalent per week   Comment: denies  . Drug use: Yes    Types: Marijuana, Oxycodone, Heroin    Comment: denies  . Sexual activity: Not on file  Lifestyle  . Physical activity:    Days per week: Not on file    Minutes per session: Not on file  . Stress: Not on file  Relationships  . Social connections:    Talks on phone: Not on file    Gets together: Not on file    Attends religious service: Not on file    Active member of club or organization: Not on file    Attends meetings of clubs or organizations: Not on file    Relationship status: Not on file  . Intimate partner violence:    Fear of current or ex partner: Not on file    Emotionally abused: Not on file    Physically abused: Not on file    Forced sexual activity: Not on file  Other Topics Concern  . Not on file  Social History Narrative  . Not on file     Physical Exam  Vital Signs and Nursing Notes reviewed Vitals:   03/17/19 1400 03/17/19 1430  BP: (!) 111/56 (!) 124/92  Pulse: 82 81  Resp: 16 14  Temp:    SpO2:  94% 95%    CONSTITUTIONAL:  ill-appearing, NAD, deeply sleeping NEURO: Somnolent, no response to painful stimuli EYES: Pupils 1 mm bilaterally ENT/NECK:  no LAD, no JVD CARDIO: Regular rate, well-perfused, normal S1 and S2 PULM: Lungs clear, respiratory rate 9/min GI/GU:  normal bowel sounds, non-distended, non-tender MSK/SPINE:  No gross deformities, no edema SKIN:  no rash, atraumatic PSYCH:  Appropriate speech and behavior  Diagnostic and Interventional Summary    Labs Reviewed  CBC - Abnormal; Notable for the following components:      Result Value   RBC 3.67 (*)    Hemoglobin 11.5 (*)    All other components within normal limits  COMPREHENSIVE METABOLIC PANEL - Abnormal; Notable for the following components:   Calcium 8.6 (*)    AST 47 (*)    All other components within normal limits  LACTIC ACID, PLASMA - Abnormal; Notable for the following components:   Lactic Acid, Venous 2.7 (*)    All other  components within normal limits  ETHANOL - Abnormal; Notable for the following components:   Alcohol, Ethyl (B) 164 (*)    All other components within normal limits  ACETAMINOPHEN LEVEL - Abnormal; Notable for the following components:   Acetaminophen (Tylenol), Serum <10 (*)    All other components within normal limits  SALICYLATE LEVEL  URINALYSIS, ROUTINE W REFLEX MICROSCOPIC  I-STAT BETA HCG BLOOD, ED ([facility], [facility], [facility] ONLY)    DG Chest Port 1 View  Final Result      Medications  sodium chloride 0.9 % bolus 1,000 mL (0 mLs Intravenous Stopped 03/17/19 1423)  naloxone (NARCAN) injection 0.2 mg (0.2 mg Intravenous Given 03/17/19 1210)  ondansetron (ZOFRAN) injection 4 mg (4 mg Intravenous Given 03/17/19 1304)     Procedures Critical Care Critical Care Documentation Critical care time provided by me (excluding procedures): 32 minutes  Condition necessitating critical care: Opioid overdose with respiratory failure  Components of critical care management: reviewing of prior records, laboratory and imaging interpretation, frequent re-examination and reassessment of vital signs, administration of supplemental oxygen, IV Narcan    ED Course and Medical Decision Making  I have reviewed the triage vital signs and the nursing notes.  Pertinent labs & imaging results that were available during my care of the patient were reviewed by me and considered in my medical decision making (see below for details).  Unresponsive on my initial evaluation with very small pupils, respiratory rate less than 10 which raise concern for opioid toxidrome along with the fact that she was not given any medications by EMS, too deep of the sedation for simple and postictal period.  Patient responded quickly to Narcan 0.2 mg IV, is now awake, alert, conversant.  Explains that she is on methadone via the methadone clinic, took her methadone dose this morning but denies any other opioid use, denies heroin or relapse on IV  drugs.  Denies any thoughts or actions of self-harm.  Expressed to patient the need for her to stay in the emergency department for a few hours to evaluate for any re-sedation, explained the dangers of leaving the hospital and re-sedating, the risk of death if this were to occur.  She reluctantly agrees to stay for further evaluation, though I have suspicion that she may elope.  If she were to elope, she would be leaving fully aware of the risks and currently with full capacity to make decisions.  Patient was able to stay in the emergency department and was observed for 3 to  4 hours.  No further sedation.  Labs are overall reassuring, mildly elevated lactate which is expected after seizure activity.  Patient was given IV fluids, vital signs are normal, she shows no signs of alcohol or benzodiazepine withdrawal at this time.  There is suspicion that patient missed a few doses or days of her home alprazolam, causing possible benzodiazepine withdrawal, and there is concern that patient possibly took illicit drugs while she was in her ED room here today.  Regardless, patient is appropriate for discharge at this time, patient was educated on the dangers of her presentation and the dangers of illicit drugs, provided with outpatient resources.  After the discussed management above, the patient was determined to be safe for discharge.  The patient was in agreement with this plan and all questions regarding their care were answered.  ED return precautions were discussed and the patient will return to the ED with any significant worsening of condition.  Elmer SowMichael M. Pilar PlateBero, MD [facility] [email or web address]  Final Clinical Impressions(s) / ED Diagnoses     ICD-10-CM   1. Drug overdose, undetermined intent, initial encounter T50.904A   2. Seizure [facility](HCC) R56.9 [facility]    [facility]    ED Discharge Orders    None         Sabas SousBero, Mychaela Lennartz M, MD  03/17/19 [phone]

## 2019-03-17 NOTE — ED Notes (Signed)
Pt aware urine sample is needed, pt states "just give me some more time".

## 2019-03-17 NOTE — ED Notes (Signed)
Bed: LK95 Expected date: 03/17/19 Expected time: 11:13 AM Means of arrival: Ambulance Comments: Seizure x 2 now alert

## 2019-11-16 IMAGING — DX PORTABLE CHEST - 1 VIEW
1 series · 1 of 1 positions shown · non-contrast
Comparison: November 25, 2016

CLINICAL DATA: Seizure with unresponsiveness

EXAM:
PORTABLE CHEST 1 VIEW

[chest ap]
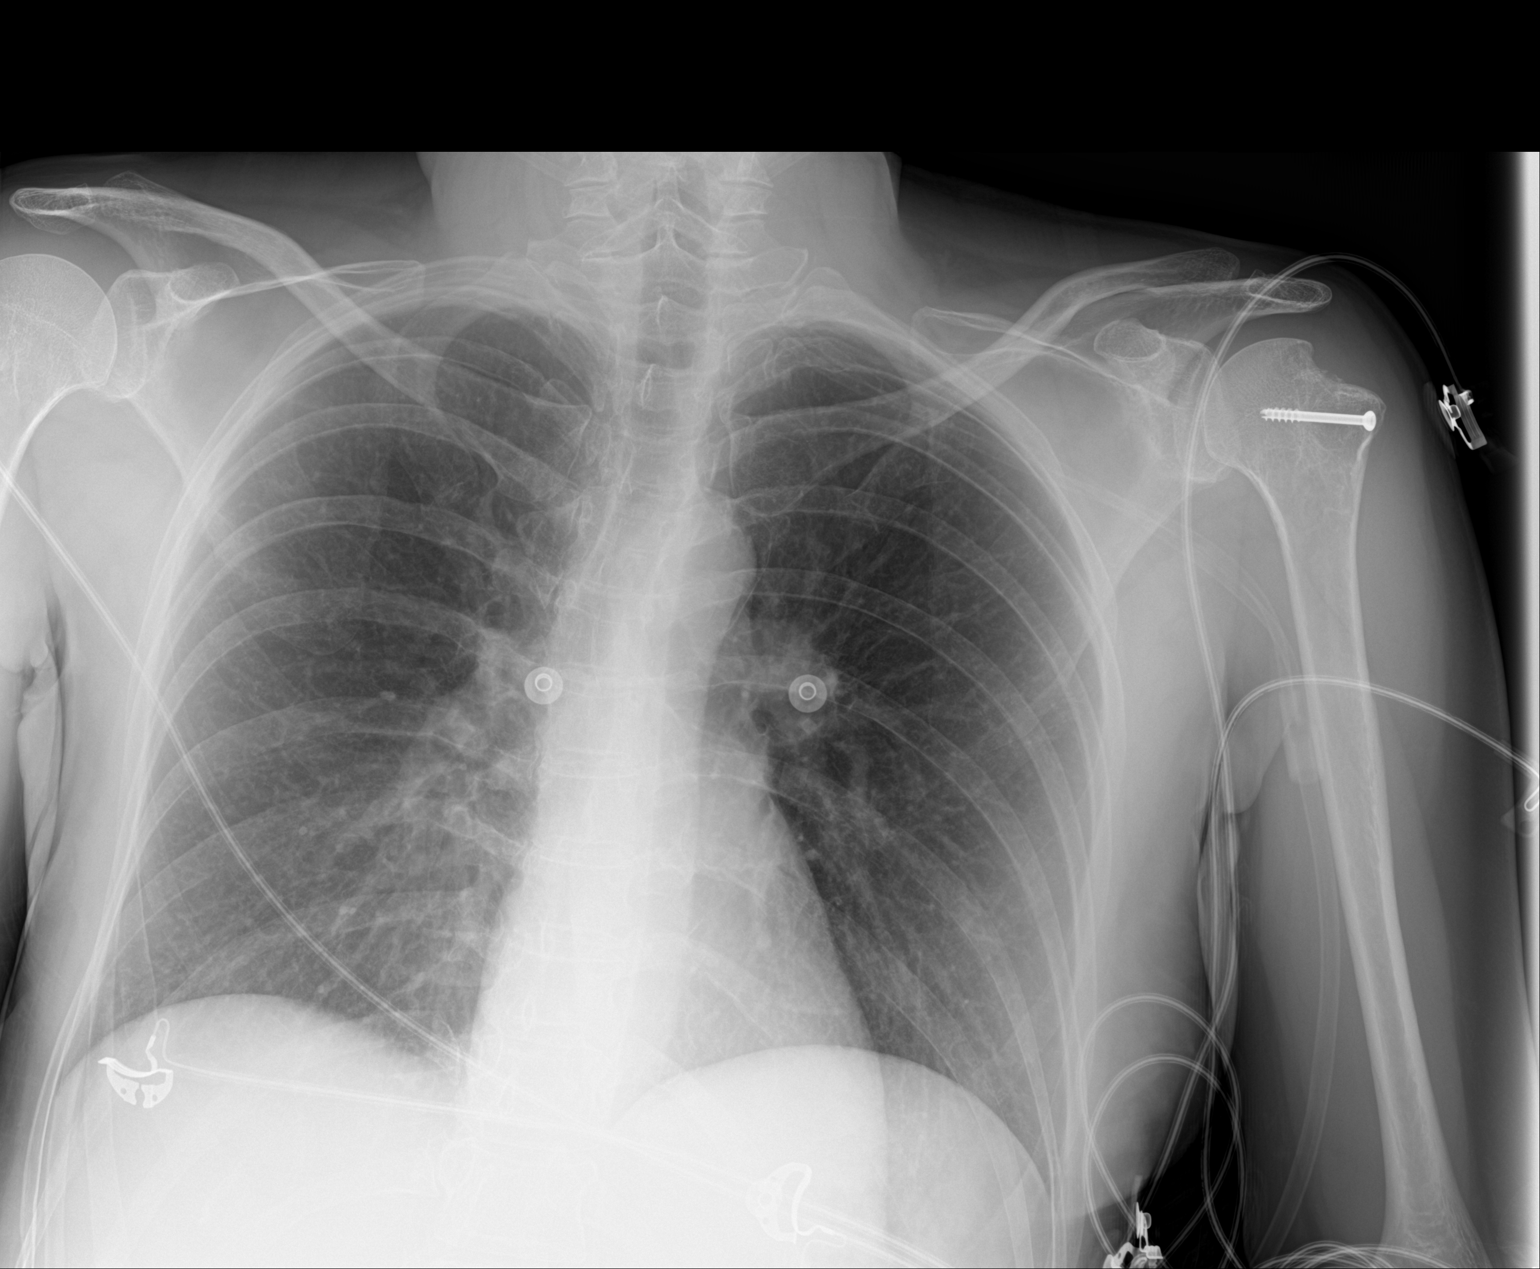

[1 of 1 positions shown; findings below may reference images not displayed]

FINDINGS: Lungs are clear. Heart size and pulmonary vascularity are normal. No
adenopathy.

There is evidence of old trauma involving the lateral proximal
humerus with screw fixation in this area. No dislocation in the
shoulder regions evident.
IMPRESSION: No edema or consolidation. Heart size normal. Posttraumatic and
postoperative change left humeral head region.

## 2020-01-09 ENCOUNTER — Ambulatory Visit: Payer: Self-pay | Admitting: Family Medicine

## 2020-01-13 ENCOUNTER — Encounter: Payer: Self-pay | Admitting: Family Medicine

## 2020-02-13 ENCOUNTER — Ambulatory Visit (INDEPENDENT_AMBULATORY_CARE_PROVIDER_SITE_OTHER): Payer: Self-pay | Admitting: Emergency Medicine

## 2020-02-13 ENCOUNTER — Other Ambulatory Visit: Payer: Self-pay

## 2020-02-13 ENCOUNTER — Encounter: Payer: Self-pay | Admitting: Emergency Medicine

## 2020-02-13 ENCOUNTER — Ambulatory Visit: Payer: Self-pay | Admitting: Family Medicine

## 2020-02-13 VITALS — BP 117/75 | HR 82 | Temp 97.2°F | Resp 16 | Ht 64.0 in | Wt 118.0 lb

## 2020-02-13 DIAGNOSIS — F1129 Opioid dependence with unspecified opioid-induced disorder: Secondary | ICD-10-CM

## 2020-02-13 DIAGNOSIS — F1910 Other psychoactive substance abuse, uncomplicated: Secondary | ICD-10-CM

## 2020-02-13 DIAGNOSIS — F909 Attention-deficit hyperactivity disorder, unspecified type: Secondary | ICD-10-CM

## 2020-02-13 DIAGNOSIS — F411 Generalized anxiety disorder: Secondary | ICD-10-CM

## 2020-02-13 DIAGNOSIS — F1320 Sedative, hypnotic or anxiolytic dependence, uncomplicated: Secondary | ICD-10-CM

## 2020-02-13 DIAGNOSIS — F4310 Post-traumatic stress disorder, unspecified: Secondary | ICD-10-CM

## 2020-02-13 DIAGNOSIS — F319 Bipolar disorder, unspecified: Secondary | ICD-10-CM

## 2020-02-13 DIAGNOSIS — Z87898 Personal history of other specified conditions: Secondary | ICD-10-CM

## 2020-02-13 DIAGNOSIS — F410 Panic disorder [episodic paroxysmal anxiety] without agoraphobia: Secondary | ICD-10-CM

## 2020-02-13 NOTE — Progress Notes (Signed)
SAMANTHAJO PAYANO 39 y.o.   Chief Complaint  Patient presents with  . Establish Care  . ADHD    HISTORY OF PRESENT ILLNESS: This is a 39 y.o. female with history of panic disorder, anxiety disorder, PTSD, ADHD, and, benzodiazepine dependence, polysubstance abuse, seizures here to establish care.  Also requesting refills on medications alprazolam Adderall and Lexapro.  HPI   Prior to Admission medications   Medication Sig Start Date End Date Taking? Authorizing Provider  ALPRAZolam Prudy Feeler) 1 MG tablet Take 1 tablet (1 mg total) by mouth 3 (three) times daily. 08/08/18  Yes Caccavale, Sophia, PA-C  amphetamine-dextroamphetamine (ADDERALL) 30 MG tablet Take 30 mg by mouth 2 (two) times daily as needed (adhd).  07/11/18  Yes [provider]  cyclobenzaprine (FLEXERIL) 10 MG tablet Take 1 tablet (10 mg total) by mouth 2 (two) times daily as needed for muscle spasms. 06/14/16  Yes Caren Griffins, MD  ibuprofen (ADVIL,MOTRIN) 200 MG tablet Take 400 mg by mouth every 6 (six) hours as needed for headache or mild pain.   Yes [provider]  famotidine (PEPCID) 20 MG tablet Take 1 tablet (20 mg total) by mouth 2 (two) times daily. Patient not taking: Reported on 02/13/2020 06/27/14   Elpidio Anis, PA-C    No Known Allergies  Patient Active Problem List   Diagnosis Date Noted  . Bipolar 1 disorder (HCC) 05/17/2014  . Alcohol dependence with intoxication with complication (HCC) 05/17/2014  . Benzodiazepine dependence (HCC) 05/17/2014  . Generalized anxiety disorder 05/17/2014  . Opioid dependence (HCC) 07/08/2013  . Opioid use with withdrawal (HCC) 07/08/2013  . Panic disorder 07/08/2013  . Unspecified episodic mood disorder 07/08/2013    Past Medical History:  Diagnosis Date  . Anxiety   . Anxiety   . Depression   . Insomnia   . Polysubstance abuse (HCC)   . Seizures (HCC)     Past Surgical History:  Procedure Laterality Date  . NO PAST SURGERIES    . SHOULDER  SURGERY    . WISDOM TOOTH EXTRACTION      Social History   Socioeconomic History  . Marital status: Single    Spouse name: Not on file  . Number of children: Not on file  . Years of education: Not on file  . Highest education level: Not on file  Occupational History  . Not on file  Tobacco Use  . Smoking status: Current Every Day Smoker    Packs/day: 1.00    Years: 11.00    Pack years: 11.00    Types: Cigarettes  . Smokeless tobacco: Never Used  Substance and Sexual Activity  . Alcohol use: Yes    Alcohol/week: 21.0 standard drinks    Types: 21 Standard drinks or equivalent per week    Comment: denies  . Drug use: Yes    Types: Marijuana, Oxycodone, Heroin    Comment: denies  . Sexual activity: Not on file  Other Topics Concern  . Not on file  Social History Narrative  . Not on file   Social Determinants of Health   Financial Resource Strain:   . Difficulty of Paying Living Expenses:   Food Insecurity:   . Worried About Programme researcher, broadcasting/film/video in the Last Year:   . Barista in the Last Year:   Transportation Needs:   . Freight forwarder (Medical):   Marland Kitchen Lack of Transportation (Non-Medical):   Physical Activity:   . Days of Exercise per Week:   .  Minutes of Exercise per Session:   Stress:   . Feeling of Stress :   Social Connections:   . Frequency of Communication with Friends and Family:   . Frequency of Social Gatherings with Friends and Family:   . Attends Religious Services:   . Active Member of Clubs or Organizations:   . Attends Archivist Meetings:   Marland Kitchen Marital Status:   Intimate Partner Violence:   . Fear of Current or Ex-Partner:   . Emotionally Abused:   Marland Kitchen Physically Abused:   . Sexually Abused:     History reviewed. No pertinent family history.   Review of Systems  Endo/Heme/Allergies: Environmental allergies:     Today's Vitals   02/13/20 1516  BP: 117/75  Pulse: 82  Resp: 16  Temp: (!) 97.2 F (36.2 C)  TempSrc:  Temporal  SpO2: 96%  Weight: 118 lb (53.5 kg)  Height: 5\' 4"  (1.626 m)   Body mass index is 20.25 kg/m.   Physical Exam Vitals reviewed.  Constitutional:      Appearance: Normal appearance.  HENT:     Head: Normocephalic.  Cardiovascular:     Rate and Rhythm: Normal rate.  Pulmonary:     Effort: Pulmonary effort is normal.  Musculoskeletal:        General: Normal range of motion.     Cervical back: Normal range of motion.  Neurological:     Mental Status: She is alert and oriented to person, place, and time.  Psychiatric:        Mood and Affect: Mood normal.        Behavior: Behavior normal.      ASSESSMENT & PLAN: After careful review of old medical records, problem list and medication list it is my decision not to establish care and be Ms. Sexton's primary care physician.  It is in her best interest to seek psychiatric care and establish care with a different practice.  Taviana was seen today for establish care and adhd.  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

## 2020-02-13 NOTE — Patient Instructions (Signed)
° ° ° °  If you have lab work done today you will be contacted with your lab results within the next 2 weeks.  If you have not heard from us then please contact us. The fastest way to get your results is to register for My Chart. ° ° °IF you received an x-ray today, you will receive an invoice from [facility]. Please contact [facility] at [phone] with questions or concerns regarding your invoice.  ° °IF you received labwork today, you will receive an invoice from [facility]. Please contact [facility] at [phone] with questions or concerns regarding your invoice.  ° °Our billing staff will not be able to assist you with questions regarding bills from these companies. ° °You will be contacted with the lab results as soon as they are available. The fastest way to get your results is to activate your My Chart account. Instructions are located on the last page of this paperwork. If you have not heard from us regarding the results in 2 weeks, please contact this office. °  ° ° ° °

## 2020-02-14 ENCOUNTER — Encounter: Payer: Self-pay | Admitting: Family Medicine

## 2020-04-17 ENCOUNTER — Ambulatory Visit: Payer: Self-pay | Admitting: Family Medicine

## 2020-06-23 ENCOUNTER — Encounter (HOSPITAL_COMMUNITY): Payer: Self-pay | Admitting: Emergency Medicine

## 2020-06-23 ENCOUNTER — Emergency Department (HOSPITAL_COMMUNITY)
Admission: EM | Admit: 2020-06-23 | Discharge: 2020-06-23 | Disposition: A | Discharge status: Home / Self Care | Payer: BC Managed Care – PPO | Attending: Emergency Medicine | Admitting: Emergency Medicine

## 2020-06-23 ENCOUNTER — Other Ambulatory Visit: Payer: Self-pay

## 2020-06-23 DIAGNOSIS — L089 Local infection of the skin and subcutaneous tissue, unspecified: Secondary | ICD-10-CM | POA: Insufficient documentation

## 2020-06-23 DIAGNOSIS — S81811A Laceration without foreign body, right lower leg, initial encounter: Secondary | ICD-10-CM | POA: Insufficient documentation

## 2020-06-23 DIAGNOSIS — Z5321 Procedure and treatment not carried out due to patient leaving prior to being seen by health care provider: Secondary | ICD-10-CM | POA: Insufficient documentation

## 2020-06-23 DIAGNOSIS — W312XXA Contact with powered woodworking and forming machines, initial encounter: Secondary | ICD-10-CM | POA: Insufficient documentation

## 2020-06-23 LAB — COMPREHENSIVE METABOLIC PANEL
ALT: 24 U/L (ref 0–44)
AST: 31 U/L (ref 15–41)
Albumin: 3.3 g/dL — ABNORMAL LOW (ref 3.5–5.0)
Alkaline Phosphatase: 78 U/L (ref 38–126)
Anion gap: 12 (ref 5–15)
BUN: 11 mg/dL (ref 6–20)
CO2: 25 mmol/L (ref 22–32)
Calcium: 9.3 mg/dL (ref 8.9–10.3)
Chloride: 98 mmol/L (ref 98–111)
Creatinine, Ser: 0.72 mg/dL (ref 0.44–1.00)
GFR calc Af Amer: >60 mL/min (ref >60)
GFR calc non Af Amer: >60 mL/min (ref >60)
Glucose, Bld: 76 mg/dL (ref 70–99)
Potassium: 3.8 mmol/L (ref 3.5–5.1)
Sodium: 135 mmol/L (ref 135–145)
Total Bilirubin: 0.3 mg/dL (ref 0.3–1.2)
Total Protein: 6.7 g/dL (ref 6.5–8.1)

## 2020-06-23 LAB — I-STAT BETA HCG BLOOD, ED (MC, WL, AP ONLY): I-stat hCG, quantitative: 7.7 m[IU]/mL — ABNORMAL HIGH (ref <5)

## 2020-06-23 LAB — LACTIC ACID, PLASMA: Lactic Acid, Venous: 1.9 mmol/L (ref 0.5–1.9)

## 2020-06-23 NOTE — ED Notes (Signed)
Called pt to recheck vitals. No response. Do not visually see pt in waiting room at this time.

## 2020-06-23 NOTE — ED Notes (Signed)
Final call for pt. No response.  °

## 2020-06-23 NOTE — ED Triage Notes (Signed)
Pt reports she cut her R leg on a carpentry tool a few days ago, states that she sprayed some wound cleaner on it and bandaged it up, 2 days ago began noticing redness and purulent drainage, denies fevers or chills.

## 2020-06-23 NOTE — ED Notes (Signed)
2x calling pt to recheck vitals. No response.  

## 2021-11-01 ENCOUNTER — Emergency Department (HOSPITAL_COMMUNITY)
Admission: EM | Admit: 2021-11-01 | Discharge: 2021-11-01 | Disposition: A | Discharge status: Home / Self Care | Payer: 59 | Attending: Emergency Medicine | Admitting: Emergency Medicine

## 2021-11-01 ENCOUNTER — Emergency Department (HOSPITAL_COMMUNITY): Payer: 59

## 2021-11-01 ENCOUNTER — Encounter (HOSPITAL_COMMUNITY): Payer: Self-pay

## 2021-11-01 DIAGNOSIS — L989 Disorder of the skin and subcutaneous tissue, unspecified: Secondary | ICD-10-CM | POA: Insufficient documentation

## 2021-11-01 DIAGNOSIS — M79641 Pain in right hand: Secondary | ICD-10-CM

## 2021-11-01 DIAGNOSIS — S29001A Unspecified injury of muscle and tendon of front wall of thorax, initial encounter: Secondary | ICD-10-CM | POA: Diagnosis not present

## 2021-11-01 DIAGNOSIS — S6991XA Unspecified injury of right wrist, hand and finger(s), initial encounter: Secondary | ICD-10-CM | POA: Insufficient documentation

## 2021-11-01 DIAGNOSIS — Y92410 Unspecified street and highway as the place of occurrence of the external cause: Secondary | ICD-10-CM | POA: Insufficient documentation

## 2021-11-01 MED ORDER — CEPHALEXIN 500 MG PO CAPS: 500.0000 mg | ORAL_CAPSULE | Freq: Four times a day (QID) | ORAL | 0 refills | Status: DC
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
## 2021-11-01 NOTE — Discharge Instructions (Addendum)
Your x-rays do not show any fracture.  Take antibiotics as written.  Follow-up with your primary care doctor within the week to make sure symptoms are improving.  Return to the ER if you have fevers worsening symptoms or any additional concerns.
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
## 2021-11-08 ENCOUNTER — Telehealth: Payer: Self-pay

## 2021-11-09 ENCOUNTER — Ambulatory Visit: Payer: 59 | Admitting: Family

## 2021-11-19 ENCOUNTER — Encounter: Payer: Self-pay | Admitting: Family

## 2021-11-19 ENCOUNTER — Ambulatory Visit (INDEPENDENT_AMBULATORY_CARE_PROVIDER_SITE_OTHER): Payer: 59 | Admitting: Family

## 2021-11-19 VITALS — BP 100/60 | HR 113 | Temp 98.2°F | Ht 64.0 in | Wt 119.8 lb

## 2021-11-19 DIAGNOSIS — F1129 Opioid dependence with unspecified opioid-induced disorder: Secondary | ICD-10-CM

## 2021-11-19 DIAGNOSIS — F319 Bipolar disorder, unspecified: Secondary | ICD-10-CM | POA: Diagnosis not present

## 2021-11-19 DIAGNOSIS — F1910 Other psychoactive substance abuse, uncomplicated: Secondary | ICD-10-CM

## 2021-11-19 DIAGNOSIS — F411 Generalized anxiety disorder: Secondary | ICD-10-CM

## 2021-11-19 NOTE — Progress Notes (Signed)
Would like to continue medication due to new job.

## 2021-11-19 NOTE — Progress Notes (Signed)
Rita Carey is a 41 y.o. female with the following history as recorded in Epic[employer]:  Patient Active Problem List   Diagnosis Date Noted   Bipolar 1 disorder (HCC) 05/17/2014   Alcohol dependence with intoxication with complication (HCC) 05/17/2014   Benzodiazepine dependence (HCC) 05/17/2014   Generalized anxiety disorder 05/17/2014   Opioid dependence (HCC) 07/08/2013   Opioid use with withdrawal (HCC) 07/08/2013   Panic disorder 07/08/2013   Unspecified episodic mood disorder 07/08/2013    Current Outpatient Medications  Medication Sig Dispense Refill   ibuprofen (ADVIL,MOTRIN) 200 MG tablet Take 400 mg by mouth every 6 (six) hours as needed for headache or mild pain.     ALPRAZolam (XANAX) 1 MG tablet Take 1 tablet (1 mg total) by mouth 3 (three) times daily. (Patient not taking: Reported on 11/19/2021) 3 tablet 0   amphetamine-dextroamphetamine (ADDERALL) 30 MG tablet Take 30 mg by mouth 2 (two) times daily as needed (adhd).  (Patient not taking: Reported on 11/19/2021)  0   cyclobenzaprine (FLEXERIL) 10 MG tablet Take 1 tablet (10 mg total) by mouth 2 (two) times daily as needed for muscle spasms. (Patient not taking: Reported on 11/19/2021) 16 tablet 0   famotidine (PEPCID) 20 MG tablet Take 1 tablet (20 mg total) by mouth 2 (two) times daily. (Patient not taking: Reported on 02/13/2020) 30 tablet 0   No current facility-administered medications for this visit.    Allergies: Patient has no known allergies.  Past Medical History:  Diagnosis Date   Anxiety    Anxiety    Depression    Insomnia    Polysubstance abuse (HCC)    Seizures (HCC)     Past Surgical History:  Procedure Laterality Date   NO PAST SURGERIES     SHOULDER SURGERY     WISDOM TOOTH EXTRACTION      No family history on file.  Social History   Tobacco Use   Smoking status: Every Day    Packs/day: 1.00    Years: 11.00    Pack years: 11.00    Types: Cigarettes   Smokeless tobacco: Never  Substance Use  Topics   Alcohol use: Yes    Alcohol/week: 21.0 standard drinks    Types: 21 Standard drinks or equivalent per week    Comment: denies    Subjective:  Patient presents today as a new patient to our office/ to me. She is technically a patient at [facility] as she was seen there in 02/2019. That note is not available for review but it looks like she was referred to psychiatry from that appointment.   She notes she has been off her Lexapro and Xanax for the past 4 months. PDMP indicates that she has not had Xanax in over a year. She is asking to get a refill on Xanax 1 mg tid- qid. She mentions that she was under the care of psychiatry at [facility] until 6 months ago. It sounds like she may have been dismissed due to financial issues because she said she was told that she owed $600 and she "wasn't able to pay them."  She indicates that she has started a new job and needs the medication. When asked about the type of work during the intake process, she was unable to tell us what type of work she is currently doing.    Objective:  Vitals:   11/19/21 1506  BP: 100/60  Pulse: (!) 113  Temp: 98.2 F (36.8 C)  TempSrc: Oral  SpO2: 96%  Weight: 119 lb 12.8 oz (54.3 kg)  Height: 5\' 4"  (1.626 m)    General: Well developed, well nourished, in no acute distress  Head: Normocephalic and atraumatic  Lungs: Respirations unlabored;  Neurologic: Alert and oriented; speech intact; face symmetrical; moves all extremities well; CNII-XII intact without focal deficit   Assessment:  1. Generalized anxiety disorder   2. Polysubstance abuse (HCC)   3. Bipolar 1 disorder (HCC)   4. Opioid dependence with opioid-induced disorder (HCC)     Plan:  Notes from recent ER visit do indicate that patient is IV drug user and was planning to start Methadone treatment. I did ask about that is and she notes that she is on Methadone and "thinks it is at [facility]." I explained to her that I would not be able to  honor her medication requests and again stressed that she needs to be under the care of psychiatrist. She in turn asks "if she can't have Xanax would we be willing to give her Klonopin."  I again stressed to her that her psychiatric needs were greater than we would be comfortable or trained to manage. She became frustrated and noted that she had waited 6 months for this appointment "because she was told my supervising physican Copland would write the medication for her." I attempted to explain to her the role that primary care would offer for her as opposed to psychiatrist.   Upon hearing that she would not be getting any type of benzodiazepine, patient opted to leave the visit of her own accord.  I will not be establishing care with this patient due to her behavior in the office.       No follow-ups on file.  No orders of the defined types were placed in this encounter.   Requested Prescriptions    No prescriptions requested or ordered in this encounter

## 2022-01-07 NOTE — Telephone Encounter (Signed)
error 

## 2022-04-29 ENCOUNTER — Encounter (HOSPITAL_COMMUNITY): Payer: Self-pay

## 2022-04-29 ENCOUNTER — Inpatient Hospital Stay (HOSPITAL_COMMUNITY)
Admission: EM | Admit: 2022-04-29 | Discharge: 2022-05-01 | DRG: 603 | Disposition: A | Discharge status: Home / Self Care | Payer: Commercial Managed Care - HMO | Attending: Internal Medicine | Admitting: Internal Medicine

## 2022-04-29 ENCOUNTER — Emergency Department (HOSPITAL_COMMUNITY): Payer: Commercial Managed Care - HMO

## 2022-04-29 ENCOUNTER — Other Ambulatory Visit: Payer: Self-pay

## 2022-04-29 DIAGNOSIS — F1910 Other psychoactive substance abuse, uncomplicated: Secondary | ICD-10-CM | POA: Diagnosis present

## 2022-04-29 DIAGNOSIS — L03221 Cellulitis of neck: Principal | ICD-10-CM | POA: Diagnosis present

## 2022-04-29 DIAGNOSIS — F1120 Opioid dependence, uncomplicated: Secondary | ICD-10-CM | POA: Diagnosis present

## 2022-04-29 DIAGNOSIS — F32A Depression, unspecified: Secondary | ICD-10-CM | POA: Diagnosis present

## 2022-04-29 DIAGNOSIS — F1320 Sedative, hypnotic or anxiolytic dependence, uncomplicated: Secondary | ICD-10-CM | POA: Diagnosis not present

## 2022-04-29 DIAGNOSIS — F17210 Nicotine dependence, cigarettes, uncomplicated: Secondary | ICD-10-CM | POA: Diagnosis present

## 2022-04-29 DIAGNOSIS — L089 Local infection of the skin and subcutaneous tissue, unspecified: Secondary | ICD-10-CM | POA: Diagnosis present

## 2022-04-29 DIAGNOSIS — F1011 Alcohol abuse, in remission: Secondary | ICD-10-CM | POA: Diagnosis present

## 2022-04-29 DIAGNOSIS — F419 Anxiety disorder, unspecified: Secondary | ICD-10-CM | POA: Diagnosis present

## 2022-04-29 DIAGNOSIS — F1990 Other psychoactive substance use, unspecified, uncomplicated: Secondary | ICD-10-CM | POA: Diagnosis present

## 2022-04-29 DIAGNOSIS — F1920 Other psychoactive substance dependence, uncomplicated: Secondary | ICD-10-CM | POA: Diagnosis present

## 2022-04-29 DIAGNOSIS — F319 Bipolar disorder, unspecified: Secondary | ICD-10-CM | POA: Diagnosis not present

## 2022-04-29 DIAGNOSIS — L0390 Cellulitis, unspecified: Secondary | ICD-10-CM | POA: Diagnosis present

## 2022-04-29 DIAGNOSIS — F1129 Opioid dependence with unspecified opioid-induced disorder: Secondary | ICD-10-CM

## 2022-04-29 DIAGNOSIS — F1110 Opioid abuse, uncomplicated: Secondary | ICD-10-CM | POA: Diagnosis present

## 2022-04-29 LAB — COMPREHENSIVE METABOLIC PANEL
ALT: 20 U/L (ref 0–44)
AST: 28 U/L (ref 15–41)
Albumin: 4 g/dL (ref 3.5–5.0)
Alkaline Phosphatase: 101 U/L (ref 38–126)
Anion gap: 11 (ref 5–15)
BUN: 13 mg/dL (ref 6–20)
CO2: 27 mmol/L (ref 22–32)
Calcium: 9.8 mg/dL (ref 8.9–10.3)
Chloride: 105 mmol/L (ref 98–111)
Creatinine, Ser: 0.84 mg/dL (ref 0.44–1.00)
GFR, Estimated: >60 mL/min (ref >60)
Glucose, Bld: 88 mg/dL (ref 70–99)
Potassium: 4.6 mmol/L (ref 3.5–5.1)
Sodium: 143 mmol/L (ref 135–145)
Total Bilirubin: 0.3 mg/dL (ref 0.3–1.2)
Total Protein: 8.5 g/dL — ABNORMAL HIGH (ref 6.5–8.1)

## 2022-04-29 LAB — RAPID URINE DRUG SCREEN, HOSP PERFORMED
Amphetamines: POSITIVE — AB
Barbiturates: NOT DETECTED
Benzodiazepines: POSITIVE — AB
Cocaine: NOT DETECTED
Opiates: POSITIVE — AB
Tetrahydrocannabinol: NOT DETECTED

## 2022-04-29 LAB — CBC WITH DIFFERENTIAL/PLATELET
Abs Immature Granulocytes: 0.01 10*3/uL (ref 0.00–0.07)
Basophils Absolute: 0 10*3/uL (ref 0.0–0.1)
Basophils Relative: 0 %
Eosinophils Absolute: 0 10*3/uL (ref 0.0–0.5)
Eosinophils Relative: 0 %
HCT: 42.2 % (ref 36.0–46.0)
Hemoglobin: 13.8 g/dL (ref 12.0–15.0)
Immature Granulocytes: 0 %
Lymphocytes Relative: 25 %
Lymphs Abs: 1.8 10*3/uL (ref 0.7–4.0)
MCH: 29.9 pg (ref 26.0–34.0)
MCHC: 32.7 g/dL (ref 30.0–36.0)
MCV: 91.3 fL (ref 80.0–100.0)
Monocytes Absolute: 0.4 10*3/uL (ref 0.1–1.0)
Monocytes Relative: 5 %
Neutro Abs: 5 10*3/uL (ref 1.7–7.7)
Neutrophils Relative %: 70 %
Platelets: 276 10*3/uL (ref 150–400)
RBC: 4.62 MIL/uL (ref 3.87–5.11)
RDW: 12.2 % (ref 11.5–15.5)
WBC: 7.2 10*3/uL (ref 4.0–10.5)
nRBC: 0 % (ref 0.0–0.2)

## 2022-04-29 LAB — LACTIC ACID, PLASMA
Lactic Acid, Venous: 4.2 mmol/L (ref 0.5–1.9)
Lactic Acid, Venous: 3 mmol/L (ref 0.5–1.9)

## 2022-04-29 MED ORDER — ACETAMINOPHEN 325 MG PO TABS: 650.0000 mg | ORAL_TABLET | Freq: Four times a day (QID) | ORAL | Status: DC | PRN

## 2022-04-29 MED ORDER — BUPRENORPHINE HCL-NALOXONE HCL 8-2 MG SL SUBL: 1.0000 | SUBLINGUAL_TABLET | Freq: Two times a day (BID) | SUBLINGUAL | Status: DC

## 2022-04-29 MED ORDER — VANCOMYCIN HCL IN DEXTROSE 1-5 GM/200ML-% IV SOLN
1000.0000 mg | Freq: Once | INTRAVENOUS | Status: AC
  Administered 2022-04-29: 1000 mg via INTRAVENOUS
  Filled 2022-04-29: qty 200

## 2022-04-29 MED ORDER — LORAZEPAM 1 MG PO TABS
1.0000 mg | ORAL_TABLET | ORAL | Status: DC | PRN
  Administered 2022-04-30: 1 mg via ORAL
  Filled 2022-04-29 (×2): qty 2

## 2022-04-29 MED ORDER — KETOROLAC TROMETHAMINE 30 MG/ML IJ SOLN
30.0000 mg | Freq: Four times a day (QID) | INTRAMUSCULAR | Status: DC | PRN
Start: 2022-04-29 — End: 2022-05-01
  Administered 2022-04-30: 30 mg via INTRAVENOUS
  Filled 2022-04-29: qty 1

## 2022-04-29 MED ORDER — ENOXAPARIN SODIUM 40 MG/0.4ML IJ SOSY
40.0000 mg | PREFILLED_SYRINGE | INTRAMUSCULAR | Status: DC
Start: 2022-04-30 — End: 2022-05-01
  Administered 2022-04-30: 40 mg via SUBCUTANEOUS
  Filled 2022-04-29: qty 0.4

## 2022-04-29 MED ORDER — LORAZEPAM 2 MG/ML IJ SOLN
1.0000 mg | INTRAMUSCULAR | Status: DC | PRN
  Administered 2022-04-30: 1 mg via INTRAVENOUS
  Filled 2022-04-29 (×2): qty 1

## 2022-04-29 MED ORDER — SODIUM CHLORIDE 0.9 % IV SOLN
2.0000 g | Freq: Once | INTRAVENOUS | Status: AC
  Administered 2022-04-29: 2 g via INTRAVENOUS
  Filled 2022-04-29: qty 12.5

## 2022-04-29 MED ORDER — THIAMINE HCL 100 MG/ML IJ SOLN: 100.0000 mg | Freq: Every day | INTRAMUSCULAR | Status: DC

## 2022-04-29 MED ORDER — LORAZEPAM 1 MG PO TABS: 0.0000 mg | ORAL_TABLET | Freq: Two times a day (BID) | ORAL | Status: DC

## 2022-04-29 MED ORDER — ADULT MULTIVITAMIN W/MINERALS CH
1.0000 | ORAL_TABLET | Freq: Every day | ORAL | Status: DC
  Administered 2022-04-30 – 2022-05-01 (×2): 1 via ORAL
  Filled 2022-04-29 (×2): qty 1

## 2022-04-29 MED ORDER — FOLIC ACID 1 MG PO TABS
1.0000 mg | ORAL_TABLET | Freq: Every day | ORAL | Status: DC
  Administered 2022-04-30 – 2022-05-01 (×2): 1 mg via ORAL
  Filled 2022-04-29 (×2): qty 1

## 2022-04-29 MED ORDER — ONDANSETRON HCL 4 MG PO TABS: 4.0000 mg | ORAL_TABLET | Freq: Four times a day (QID) | ORAL | Status: DC | PRN

## 2022-04-29 MED ORDER — THIAMINE HCL 100 MG PO TABS
100.0000 mg | ORAL_TABLET | Freq: Every day | ORAL | Status: DC
  Administered 2022-04-30 – 2022-05-01 (×2): 100 mg via ORAL
  Filled 2022-04-29 (×2): qty 1

## 2022-04-29 MED ORDER — LACTATED RINGERS IV SOLN
INTRAVENOUS | Status: DC
Start: 2022-04-29 — End: 2022-05-01

## 2022-04-29 MED ORDER — BUPRENORPHINE HCL-NALOXONE HCL 2-0.5 MG SL SUBL: 2.0000 | SUBLINGUAL_TABLET | SUBLINGUAL | Status: DC | PRN

## 2022-04-29 MED ORDER — IOHEXOL 300 MG/ML  SOLN
75.0000 mL | Freq: Once | INTRAMUSCULAR | Status: AC | PRN
  Administered 2022-04-29: 75 mL via INTRAVENOUS

## 2022-04-29 MED ORDER — LORAZEPAM 1 MG PO TABS
0.0000 mg | ORAL_TABLET | Freq: Four times a day (QID) | ORAL | Status: DC
  Administered 2022-04-30: 2 mg via ORAL
  Administered 2022-04-30 – 2022-05-01 (×4): 1 mg via ORAL
  Filled 2022-04-29 (×2): qty 1
  Filled 2022-04-29: qty 2
  Filled 2022-04-29: qty 1

## 2022-04-29 MED ORDER — ONDANSETRON HCL 4 MG/2ML IJ SOLN
4.0000 mg | Freq: Four times a day (QID) | INTRAMUSCULAR | Status: DC | PRN
  Administered 2022-04-30: 4 mg via INTRAVENOUS
  Filled 2022-04-29: qty 2

## 2022-04-29 MED ORDER — ACETAMINOPHEN 650 MG RE SUPP: 650.0000 mg | Freq: Four times a day (QID) | RECTAL | Status: DC | PRN

## 2022-04-29 NOTE — Assessment & Plan Note (Addendum)
Suboxone pathway for opiate withdrawals when she develops these.

## 2022-04-29 NOTE — H&P (Signed)
History and Physical    Patient: Rita Carey RXV:[id number] DOB: [birth year] DOA: 04/29/2022 DOS: the patient was seen and examined on 04/29/2022 PCP: Pcp, No  Patient coming from: Home  Chief Complaint:  Chief Complaint  Patient presents with   Wound Infection   HPI: Rita Carey is a 41 y.o. female with medical history significant of polysubstance abuse.  Pt with ongoing IVDU.  Patient reports approximately 1 week ago she shot up heroin into her left collarbone area, she has been using this area for the past year and a half.  Area now painful with skin changes, erythema, and central necrosis.  No SI/HI.  Advil for pain control without improvement.   Review of Systems: As mentioned in the history of present illness. All other systems reviewed and are negative. Past Medical History:  Diagnosis Date   Anxiety    Anxiety    Depression    Insomnia    Polysubstance abuse (HCC)    Seizures (HCC)    Past Surgical History:  Procedure Laterality Date   NO PAST SURGERIES     SHOULDER SURGERY     WISDOM TOOTH EXTRACTION     Social History:  reports that she has been smoking cigarettes. She has a 11.00 pack-year smoking history. She has never used smokeless tobacco. She reports current alcohol use of about 21.0 standard drinks of alcohol per week. She reports current drug use. Drugs: Marijuana, Oxycodone, and Heroin.  No Known Allergies  History reviewed. No pertinent family history.  Prior to Admission medications   Medication Sig Start Date End Date Taking? Authorizing Provider  ALPRAZolam Prudy Feeler) 1 MG tablet Take 1 tablet (1 mg total) by mouth 3 (three) times daily. Patient not taking: Reported on 11/19/2021 08/08/18   Caccavale, Sophia, PA-C  amphetamine-dextroamphetamine (ADDERALL) 30 MG tablet Take 30 mg by mouth 2 (two) times daily as needed (adhd).  Patient not taking: Reported on 11/19/2021 07/11/18   [provider]  cyclobenzaprine (FLEXERIL) 10 MG tablet  Take 1 tablet (10 mg total) by mouth 2 (two) times daily as needed for muscle spasms. Patient not taking: Reported on 11/19/2021 06/14/16   Caren Griffins, MD  famotidine (PEPCID) 20 MG tablet Take 1 tablet (20 mg total) by mouth 2 (two) times daily. Patient not taking: Reported on 02/13/2020 06/27/14   Elpidio Anis, PA-C  ibuprofen (ADVIL,MOTRIN) 200 MG tablet Take 400 mg by mouth every 6 (six) hours as needed for headache or mild pain.    [provider]    Physical Exam: Vitals:   04/29/22 2225 04/29/22 2337 04/29/22 2340 04/29/22 2345  BP: 93/64 93/69 93/69  97/65  Pulse: 99 99 99 (!) 115  Resp: 18 14  20   Temp:      TempSrc:      SpO2: 95% 99%  98%  Weight:      Height:       Constitutional: Anxious Eyes: PERRL, lids and conjunctivae normal ENMT: Mucous membranes are moist. Posterior pharynx clear of any exudate or lesions.Normal dentition.  Neck: normal, supple, no masses, no thyromegaly Respiratory: clear to auscultation bilaterally, no wheezing, no crackles. Normal respiratory effort. No accessory muscle use.  Cardiovascular: Tachycardic Abdomen: no tenderness, no masses palpated. No hepatosplenomegaly. Bowel sounds positive.  Musculoskeletal: no clubbing / cyanosis. No joint deformity upper and lower extremities. Good ROM, no contractures. Normal muscle tone.  Skin:     Neurologic: CN 2-12 grossly intact. Sensation intact, DTR normal. Strength 5/5 in all 4.  Psychiatric: Anxious  Data Reviewed:    CBC    Component Value Date/Time   WBC 7.2 04/29/2022 2114   RBC 4.62 04/29/2022 2114   HGB 13.8 04/29/2022 2114   HCT 42.2 04/29/2022 2114   PLT 276 04/29/2022 2114   MCV 91.3 04/29/2022 2114   MCH 29.9 04/29/2022 2114   MCHC 32.7 04/29/2022 2114   RDW 12.2 04/29/2022 2114   LYMPHSABS 1.8 04/29/2022 2114   MONOABS 0.4 04/29/2022 2114   EOSABS 0.0 04/29/2022 2114   BASOSABS 0.0 04/29/2022 2114      Latest Ref Rng & Units 04/29/2022    9:14 PM 06/23/2020     2:53 PM 03/17/2019   11:56 AM  BMP  Glucose 70 - 99 mg/dL 88  76  92   BUN 6 - 20 mg/dL 13  11  11    Creatinine 0.44 - 1.00 mg/dL  8.31  5.17   Sodium 135 - 145 mmol/L 143  135  138   Potassium 3.5 - 5.1 mmol/L 4.6  3.8  3.5   Chloride 98 - 111 mmol/L 105  98  104   CO2 22 - 32 mmol/L 27  25  24    Calcium 8.9 - 10.3 mg/dL 9.8  9.3  8.6    CT neck = normal CT of neck soft tissues.  Assessment and Plan: * Infection of skin of neck Cellulitis pathway MRSA risk = IVDU MRSA pcr nares Initial lactate 4.2 IVF bolus and LR at 125 Repeat lactate ordered Got cefepime and vanc in ED, will leave her on this for the moment BCx pending (only able to get 1 set of BCx due to poor venous access)  Polysubstance abuse (HCC) Today UDS positive for: 1) opiates (heroin by pt own admission) 2) amphetamines 3) benzos Will check BAL as well.  IVDU (intravenous drug user) Ongoing IVDU of heroin, injects into her neck.  Heroin abuse (HCC) Ongoing IVDU of heroin, injects into neck.  Benzodiazepine dependence (HCC) CIWA for benzo withdrawal with scheduled ativan.  Opioid dependence (HCC) Suboxone pathway for opiate withdrawals when she develops these.  History of ETOH abuse CIWA as above.  Bipolar 1 disorder (HCC) Med rec pending.      Advance Care Planning:   Code Status: Full Code  Consults: None  Family Communication: No family in room  Severity of Illness: The appropriate patient status for this patient is OBSERVATION. Observation status is judged to be reasonable and necessary in order to provide the required intensity of service to ensure the patient's safety. The patient's presenting symptoms, physical exam findings, and initial radiographic and laboratory data in the context of their medical condition is felt to place them at decreased risk for further clinical deterioration. Furthermore, it is anticipated that the patient will be medically stable for discharge from the  hospital within 2 midnights of admission.   Author: [id number]., DO 04/29/2022 11:52 PM  For on call review [email or web address].

## 2022-04-29 NOTE — Assessment & Plan Note (Signed)
CIWA as above.

## 2022-04-29 NOTE — Assessment & Plan Note (Signed)
Med rec pending ?

## 2022-04-29 NOTE — Assessment & Plan Note (Signed)
Ongoing IVDU of heroin, injects into her neck.

## 2022-04-29 NOTE — ED Notes (Signed)
Only able to obtain one set blood cultures dt scarring/hx ivdu. Provider aware and okayed.

## 2022-04-29 NOTE — Assessment & Plan Note (Signed)
1. Cellulitis pathway 2. MRSA risk = IVDU 3. MRSA pcr nares 4. Initial lactate 4.2 1. IVF bolus and LR at 125 2. Repeat lactate ordered 5. Got cefepime and vanc in ED, will leave her on this for the moment 6. BCx pending (only able to get 1 set of BCx due to poor venous access)

## 2022-04-29 NOTE — Assessment & Plan Note (Signed)
Ongoing IVDU of heroin, injects into neck.

## 2022-04-29 NOTE — ED Provider Notes (Signed)
[facility] [facility]EM[facility]GENCY DEPT Provider Note   CSN: [id number] Arrival date & time: 04/29/22  2005     History  Chief Complaint  Patient presents with   Wound Infection    Rita Carey is a 41 y.o. female.  41 y.o female with a past medical history of drug abuse presents to the ED with a chief complaint of left neck pain.  Patient reports approximately 1 week ago she shot up heroin into her left collarbone area, she has been using this area for the past year and a half.  She endorses pain to the area, also some skin changes.  Since this incident, she states that she has been having a headache.  He would like to get help with her addiction, she denies any SI or HI.  She does report the heroin she used, was not "the stuff that dissolved with water ", reports this was more so of a gel that she injected to her left collarbone.  She has taken some Advil for pain control without any improvement.  No vision changes, no dizziness, or fevers.   The history is provided by the patient and medical records.       Home Medications Prior to Admission medications   Medication Sig Start Date End Date Taking? Authorizing Provider  ALPRAZolam Prudy Feeler) 1 MG tablet Take 1 tablet (1 mg total) by mouth 3 (three) times daily. Patient not taking: Reported on 11/19/2021 08/08/18   Caccavale, Sophia, PA-C  amphetamine-dextroamphetamine (ADDERALL) 30 MG tablet Take 30 mg by mouth 2 (two) times daily as needed (adhd).  Patient not taking: Reported on 11/19/2021 07/11/18   [provider]  cyclobenzaprine (FLEXERIL) 10 MG tablet Take 1 tablet (10 mg total) by mouth 2 (two) times daily as needed for muscle spasms. Patient not taking: Reported on 11/19/2021 06/14/16   Caren Griffins, MD  famotidine (PEPCID) 20 MG tablet Take 1 tablet (20 mg total) by mouth 2 (two) times daily. Patient not taking: Reported on 02/13/2020 06/27/14   Elpidio Anis, PA-C  ibuprofen (ADVIL,MOTRIN) 200 MG tablet Take  400 mg by mouth every 6 (six) hours as needed for headache or mild pain.    [provider]      Allergies    Patient has no known allergies.    Review of Systems   Review of Systems  Constitutional:  Positive for fever. Negative for chills.  HENT:  Negative for sore throat.   Respiratory:  Negative for shortness of breath.   Cardiovascular:  Negative for chest pain.  Gastrointestinal:  Negative for abdominal pain, nausea and vomiting.  Genitourinary:  Negative for flank pain.  Musculoskeletal:  Negative for back pain.  Skin:  Positive for color change and wound.  Neurological:  Positive for headaches.  All other systems reviewed and are negative.   Physical Exam Updated Vital Signs BP 93/64 (BP Location: Right Arm)   Pulse 99   Temp 99.5 F (37.5 C) (Oral)   Resp 18   Ht 5\' 4"  (1.626 m)   Wt 54.4 kg   LMP 04/28/2022 (Approximate)   SpO2 95%   BMI 20.60 kg/m  Physical Exam Vitals and nursing note reviewed.  Constitutional:      Appearance: Normal appearance.     Comments: Teary eyed on exam  HENT:     Mouth/Throat:     Mouth: Mucous membranes are moist.  Neck:     Trachea: Trachea normal.     Comments: Please see photos  attached Cardiovascular:     Rate and Rhythm: Tachycardia present.  Pulmonary:     Effort: Pulmonary effort is normal.  Abdominal:     General: Abdomen is flat.  Musculoskeletal:     Cervical back: Normal range of motion and neck supple. Erythema and tenderness present. No rigidity. No pain with movement. Normal range of motion.  Skin:    General: Skin is warm and dry.     Findings: Erythema present.  Neurological:     Mental Status: She is alert and oriented to person, place, and time.        ED Results / Procedures / Treatments   Labs (all labs ordered are listed, but only abnormal results are displayed) Labs Reviewed  COMPREHENSIVE METABOLIC PANEL - Abnormal; Notable for the following components:      Result Value    Total Protein 8.5 (*)    All other components within normal limits  RAPID URINE DRUG SCREEN, HOSP PERFORMED - Abnormal; Notable for the following components:   Opiates POSITIVE (*)    Benzodiazepines POSITIVE (*)    Amphetamines POSITIVE (*)    All other components within normal limits  LACTIC ACID, PLASMA - Abnormal; Notable for the following components:   Lactic Acid, Venous 4.2 (*)    All other components within normal limits  CULTURE, BLOOD (ROUTINE X 2)  CULTURE, BLOOD (ROUTINE X 2)  CBC WITH DIFFERENTIAL/PLATELET  LACTIC ACID, PLASMA  ETHANOL    EKG None  Radiology DG Chest 2 View  Result Date: 04/29/2022 CLINICAL DATA:  Shortness of breath EXAM: CHEST - 2 VIEW COMPARISON:  11/01/2021 FINDINGS: The heart size and mediastinal contours are within normal limits. Both lungs are clear. Postsurgical changes of the left humeral head. IMPRESSION: No active cardiopulmonary disease. Electronically Signed   By: Jasmine Pang M.D.   On: 04/29/2022 23:20   CT Soft Tissue Neck W Contrast  Result Date: 04/29/2022 CLINICAL DATA:  Soft tissue swelling.  Recent heroin injection. EXAM: CT NECK WITH CONTRAST TECHNIQUE: Multidetector CT imaging of the neck was performed using the standard protocol following the bolus administration of intravenous contrast. RADIATION DOSE REDUCTION: This exam was performed according to the departmental dose-optimization program which includes automated exposure control, adjustment of the mA and/or kV according to patient size and/or use of iterative reconstruction technique. CONTRAST:  3mL OMNIPAQUE IOHEXOL 300 MG/ML  SOLN COMPARISON:  None Available. FINDINGS: Pharynx and larynx: Normal. No mass or swelling. Salivary glands: No inflammation, mass, or stone. Thyroid: Normal. Lymph nodes: None enlarged or abnormal density. Vascular: Negative. Limited intracranial: Negative. Visualized orbits: Negative. Mastoids and visualized paranasal sinuses: Clear. Skeleton: No acute  or aggressive process. Upper chest: Negative. Other: None. IMPRESSION: Normal CT of the neck. Electronically Signed   By: Deatra Robinson M.D.   On: 04/29/2022 23:16   CT HEAD WO CONTRAST ( )  Result Date: 04/29/2022 CLINICAL DATA:  Headache EXAM: CT HEAD WITHOUT CONTRAST TECHNIQUE: Contiguous axial images were obtained from the base of the skull through the vertex without intravenous contrast. RADIATION DOSE REDUCTION: This exam was performed according to the departmental dose-optimization program which includes automated exposure control, adjustment of the mA and/or kV according to patient size and/or use of iterative reconstruction technique. COMPARISON:  None Available. FINDINGS: Brain: No evidence of acute infarction, hemorrhage, hydrocephalus, extra-axial collection or mass lesion/mass effect. Vascular: No hyperdense vessel or unexpected calcification. Skull: Normal. Negative for fracture or focal lesion. Sinuses/Orbits: The visualized paranasal sinuses are essentially clear. The mastoid  air cells are unopacified. Other: None. IMPRESSION: Normal head CT. Electronically Signed   By: Charline Bills M.D.   On: 04/29/2022 23:07    Procedures Procedures    Medications Ordered in ED Medications  LORazepam (ATIVAN) tablet 1-4 mg (has no administration in time range)    Or  LORazepam (ATIVAN) injection 1-4 mg (has no administration in time range)  thiamine tablet 100 mg (has no administration in time range)    Or  thiamine (B-1) injection 100 mg (has no administration in time range)  folic acid (FOLVITE) tablet 1 mg (has no administration in time range)  multivitamin with minerals tablet 1 tablet (has no administration in time range)  LORazepam (ATIVAN) tablet 0-4 mg (has no administration in time range)    Followed by  LORazepam (ATIVAN) tablet 0-4 mg (has no administration in time range)  buprenorphine-naloxone (SUBOXONE) 2-0.5 mg per SL tablet 2 tablet (has no administration in time range)   buprenorphine-naloxone (SUBOXONE) 8-2 mg per SL tablet 1 tablet (has no administration in time range)  vancomycin (VANCOCIN) IVPB 1000 mg/200 mL premix (1,000 mg Intravenous New Bag/Given 04/29/22 2236)  ceFEPIme (MAXIPIME) 2 g in sodium chloride 0.9 % 100 mL IVPB (0 g Intravenous Stopped 04/29/22 2238)  iohexol (OMNIPAQUE) 300 MG/ML solution 75 mL (75 mLs Intravenous Contrast Given 04/29/22 2249)    ED Course/ Medical Decision Making/ A&P Clinical Course as of 04/29/22 2337  Fri Apr 29, 2022  2246 Opiates(!): POSITIVE [JS]  2246 Benzodiazepines(!): POSITIVE [JS]  2246 Amphetamines(!): POSITIVE [JS]    Clinical Course User Index [JS] Claude Manges, PA-C                           Medical Decision Making Amount and/or Complexity of Data Reviewed Labs: ordered. Decision-making details documented in ED Course. Radiology: ordered.  Risk Prescription drug management.   This patient presents to the ED for concern of left neck pain, this involves a number of treatment options, and is a complaint that carries with it a high risk of complications and morbidity.  The differential diagnosis includes colitis, thrombophlebitis, versus    Co morbidities: Discussed in HPI   Brief History:  With a prior history of IV drug use presents to the ED left neck pain status post IV heroin injection to her neck.  EMR reviewed including pt PMHx, past surgical history and past visits to ER.   See HPI for more details   Lab Tests:  I ordered and independently interpreted labs.  The pertinent results include:    I personally reviewed all laboratory work and imaging. Metabolic panel without any acute abnormality specifically kidney function within normal limits and no significant electrolyte abnormalities. CBC without leukocytosis or significant anemia.  Lactic acid is 4.2, UDS positive for opioids, benzos, amphetamines.  Blood cultures have been obtained.   Imaging Studies:  CT head, CT soft  tissue neck without any acute finding.  X-ray of the chest without any acute finding.    Cardiac Monitoring:  The patient was maintained on a cardiac monitor.  I personally viewed and interpreted the cardiac monitored which showed an underlying rhythm of: NSR EKG non-ischemic   Medicines ordered:  I ordered medication including vancomycin, cefepime for cellulitis Reevaluation of the patient after these medicines showed that the patient stayed the same I have reviewed the patients home medicines and have made adjustments as needed  Reevaluation:  After the interventions noted above I re-evaluated  patient and found that they have :stayed the same   Social Determinants of Health:  The patient's social determinants of health were a factor in the care of this patient    Problem List / ED Course:  Patient here with wound to her left neck after injecting IV heroin approximately 1 week ago.  Soft pressures on arrival lactic acid of 4.2.  The rest of her labs are within normal limits.  CT imaging did not show any acute finding.  Patient did have blood cultures drawn.  UDS positive for multiple substances.  I do feel that patient is IV antibiotics for her likely cellulitis, low-grade temp on arrival 99.5 started on broad-spectrum antibiotics such as vancomycin and cefepime for cellulitis.  Spoke to Dr. Julian Reil who will admit patient for further management.    Dispostion:  After consideration of the diagnostic results and the patients response to treatment, I feel that the patent would benefit from admission for antibiotic therapy, help with detox along with further management.   Portions of this note were generated with Scientist, clinical (histocompatibility and immunogenetics). Dictation errors may occur despite best attempts at proofreading.   Final Clinical Impression(s) / ED Diagnoses Final diagnoses:  Cellulitis of neck    Rx / DC Orders ED Discharge Orders     None         Freddy Jaksch 04/29/22  2337    Wynetta Fines, MD 05/01/22 [phone]

## 2022-04-29 NOTE — ED Triage Notes (Signed)
Patient did IV heroin about a week ago and injected near her left collar bone area. Black hole and redness are surrounding the area. Does not itch or burn. Patient tearful and wants to receive help in her addiction problem.

## 2022-04-29 NOTE — Assessment & Plan Note (Signed)
Today UDS positive for: 1) opiates (heroin by pt own admission) 2) amphetamines 3) benzos Will check BAL as well.

## 2022-04-29 NOTE — Assessment & Plan Note (Signed)
CIWA for benzo withdrawal with scheduled ativan.

## 2022-04-30 DIAGNOSIS — F419 Anxiety disorder, unspecified: Secondary | ICD-10-CM | POA: Diagnosis present

## 2022-04-30 DIAGNOSIS — L0390 Cellulitis, unspecified: Secondary | ICD-10-CM | POA: Diagnosis present

## 2022-04-30 DIAGNOSIS — F17210 Nicotine dependence, cigarettes, uncomplicated: Secondary | ICD-10-CM | POA: Diagnosis present

## 2022-04-30 DIAGNOSIS — L03221 Cellulitis of neck: Secondary | ICD-10-CM | POA: Diagnosis present

## 2022-04-30 DIAGNOSIS — L089 Local infection of the skin and subcutaneous tissue, unspecified: Secondary | ICD-10-CM | POA: Diagnosis not present

## 2022-04-30 DIAGNOSIS — F32A Depression, unspecified: Secondary | ICD-10-CM | POA: Diagnosis present

## 2022-04-30 LAB — CBC
HCT: 36.1 % (ref 36.0–46.0)
Hemoglobin: 12 g/dL (ref 12.0–15.0)
MCH: 30.6 pg (ref 26.0–34.0)
MCHC: 33.2 g/dL (ref 30.0–36.0)
MCV: 92.1 fL (ref 80.0–100.0)
Platelets: 223 10*3/uL (ref 150–400)
RBC: 3.92 MIL/uL (ref 3.87–5.11)
RDW: 12.3 % (ref 11.5–15.5)
WBC: 8.5 10*3/uL (ref 4.0–10.5)
nRBC: 0 % (ref 0.0–0.2)

## 2022-04-30 LAB — MRSA NEXT GEN BY PCR, NASAL: MRSA by PCR Next Gen: NOT DETECTED

## 2022-04-30 LAB — BASIC METABOLIC PANEL
Anion gap: 5 (ref 5–15)
BUN: 13 mg/dL (ref 6–20)
CO2: 29 mmol/L (ref 22–32)
Calcium: 8.6 mg/dL — ABNORMAL LOW (ref 8.9–10.3)
Chloride: 109 mmol/L (ref 98–111)
Creatinine, Ser: 0.83 mg/dL (ref 0.44–1.00)
GFR, Estimated: >60 mL/min (ref >60)
Glucose, Bld: 107 mg/dL — ABNORMAL HIGH (ref 70–99)
Potassium: 4.2 mmol/L (ref 3.5–5.1)
Sodium: 143 mmol/L (ref 135–145)

## 2022-04-30 LAB — LACTIC ACID, PLASMA
Lactic Acid, Venous: 2.3 mmol/L (ref 0.5–1.9)
Lactic Acid, Venous: 2.3 mmol/L (ref 0.5–1.9)

## 2022-04-30 LAB — HIV ANTIBODY (ROUTINE TESTING W REFLEX): HIV Screen 4th Generation wRfx: NONREACTIVE

## 2022-04-30 LAB — ETHANOL: Alcohol, Ethyl (B): 55 mg/dL — ABNORMAL HIGH (ref <10)

## 2022-04-30 MED ORDER — METHADONE HCL 10 MG PO TABS
30.0000 mg | ORAL_TABLET | Freq: Every day | ORAL | Status: DC
  Administered 2022-04-30: 30 mg via ORAL
  Filled 2022-04-30: qty 3

## 2022-04-30 MED ORDER — METHADONE HCL 10 MG PO TABS
40.0000 mg | ORAL_TABLET | Freq: Every day | ORAL | Status: DC
  Filled 2022-04-30: qty 4

## 2022-04-30 MED ORDER — BUPRENORPHINE HCL-NALOXONE HCL 2-0.5 MG SL SUBL: 2.0000 | SUBLINGUAL_TABLET | SUBLINGUAL | Status: DC | PRN

## 2022-04-30 MED ORDER — LORAZEPAM 2 MG/ML IJ SOLN
1.0000 mg | Freq: Once | INTRAMUSCULAR | Status: AC
Start: 2022-04-30 — End: 2022-04-30
  Administered 2022-04-30: 1 mg via INTRAVENOUS

## 2022-04-30 MED ORDER — METHADONE HCL 5 MG PO TABS
50.0000 mg | ORAL_TABLET | Freq: Every day | ORAL | Status: DC
Start: 2022-04-30 — End: 2022-04-30

## 2022-04-30 MED ORDER — VANCOMYCIN HCL 750 MG/150ML IV SOLN
750.0000 mg | Freq: Two times a day (BID) | INTRAVENOUS | Status: DC
  Administered 2022-04-30 (×2): 750 mg via INTRAVENOUS
  Filled 2022-04-30 (×3): qty 150

## 2022-04-30 MED ORDER — SODIUM CHLORIDE 0.9 % IV SOLN
2.0000 g | Freq: Three times a day (TID) | INTRAVENOUS | Status: DC
  Administered 2022-04-30 – 2022-05-01 (×4): 2 g via INTRAVENOUS
  Filled 2022-04-30 (×5): qty 12.5

## 2022-04-30 MED ORDER — BUPRENORPHINE HCL-NALOXONE HCL 8-2 MG SL SUBL
1.0000 | SUBLINGUAL_TABLET | Freq: Two times a day (BID) | SUBLINGUAL | Status: DC
  Administered 2022-04-30: 1 via SUBLINGUAL
  Filled 2022-04-30: qty 1

## 2022-04-30 NOTE — TOC Initial Note (Signed)
Transition of Care [facility]) - Initial/Assessment Note   Patient Details  Name: Rita Carey MRN: [id number] Date of Birth: [birth year]  Transition of Care [facility]) CM/SW Contact:    Ewing Schlein, LCSW Phone Number: 04/30/2022, 3:58 PM  Clinical Narrative: [facility] consult for substance use resources. CSW attempted to speak with patient, but patient was crying and unable to manage a conversation at that time. CSW explained substance use resources will be added to the AVS and TOC will follow up at a later time due to patient's current emotional state.  Expected Discharge Plan: Home/Self Care Barriers to Discharge: Continued Medical Work up  Patient Goals and CMS Choice Patient states their goals for this hospitalization and ongoing recovery are:: Patient unable to verbalize at this time Choice offered to / list presented to : NA  Expected Discharge Plan and Services Expected Discharge Plan: Home/Self Care In-house Referral: Clinical Social Work Post Acute Care Choice: NA Living arrangements for the past 2 months: Single Family Home           DME Arranged: N/A DME Agency: NA  Prior Living Arrangements/Services Living arrangements for the past 2 months: Single Family Home Patient language and need for interpreter reviewed:: Yes Need for Family Participation in Patient Care: No (Comment) Care giver support system in place?: Yes (comment) Criminal Activity/Legal Involvement Pertinent to Current Situation/Hospitalization: No - Comment as needed  Emotional Assessment Attitude/Demeanor/Rapport: Crying Affect (typically observed): Agitated, Overwhelmed Orientation: : Oriented to Self, Oriented to Place, Oriented to  Time, Oriented to Situation Alcohol / Substance Use: Illicit Drugs  Admission diagnosis:  Cellulitis of neck [L03.221] Cellulitis [L03.90] Patient Active Problem List   Diagnosis Date Noted   Cellulitis 04/30/2022   Heroin abuse (HCC) 04/29/2022   IVDU (intravenous drug user)  04/29/2022   History of ETOH abuse 04/29/2022   Polysubstance abuse (HCC) 04/29/2022   Infection of skin of neck 04/29/2022   Bipolar 1 disorder (HCC) 05/17/2014   Alcohol dependence with intoxication with complication (HCC) 05/17/2014   Benzodiazepine dependence (HCC) 05/17/2014   Generalized anxiety disorder 05/17/2014   Opioid dependence (HCC) 07/08/2013   Opioid use with withdrawal (HCC) 07/08/2013   Panic disorder 07/08/2013   Unspecified episodic mood disorder 07/08/2013   PCP:  Pcp, No Pharmacy:   [facility] [address], [place] - [address] [place] [address] Phone: [phone] Fax: [phone]  [id number] - [place], [place] - [address] AT [facility] [address] [place] [address] Phone: [phone] Fax: [phone]  Readmission Risk Interventions     No data to display

## 2022-04-30 NOTE — Progress Notes (Signed)
Pharmacy Antibiotic Note  Rita Carey is a 41 y.o. female admitted on 04/29/2022 with cellulitis of left collarbone area at site of heroin injection.  Pharmacy has been consulted for Cefepime + Vancomycin dosing.  Plan: Cefepime 2gm IV q8h Vancomycin 750mg  IV q12h to target AUC 400-550 Check Vancomycin levels at steady state Monitor renal function and cx data   Height: 5\' 4"  (162.6 cm) Weight: 54.4 kg (120 lb) IBW/kg (Calculated) : 54.7  Temp (24hrs), Avg:99.5 F (37.5 C), Min:99.5 F (37.5 C), Max:99.5 F (37.5 C)  Recent Labs  Lab 04/29/22 2114 04/29/22 2318  WBC 7.2  --   CREATININE 0.84  --   LATICACIDVEN 4.2* 3.0*    Estimated Creatinine Clearance: 74.9 mL/min (by C-G formula based on SCr of 0.84 mg/dL).    No Known Allergies  Antimicrobials this admission: 7/21 Cefepime >>  7/21 Vancomycin >>   Dose adjustments this admission:  Microbiology results: 7/21 BCx:  MRSA PCR:   Thank you for allowing pharmacy to be a part of this patient's care.  8/21 PharmD 04/30/2022 12:19 AM

## 2022-04-30 NOTE — Progress Notes (Addendum)
In pt rm giving her the 1800 po Ativan dose, assisted pt to BR and noticed pt holding something in her hand, moving it around to hide it from me. Confiscated a vaping device from pt, instructed that I would place it in her chart and can retrieve it upon D/C. As pt has a large, overstuffed backpack in her rm, I asked pt if she had any substances (illegal or otherwise) in her possession/backpack. Explained that this would be very dangerous w the meds that we are currently giving her, pt states she has no substances in her possession/backpack. Pt cont to close her door, instructed that for safety reasons we must keep her door open so that we can monitor her responses to meds and for other safety reasons (pt w hx of seizures). Will cont to monitor

## 2022-04-30 NOTE — Progress Notes (Signed)
PROGRESS NOTE  Rita Carey HAL:[id number] DOB: 12-14-1980 DOA: 04/29/2022 PCP: Pcp, No   LOS: 0 days   Brief Narrative / Interim history: 41 y.o. female with medical history significant of polysubstance abuse.  Pt with ongoing IVDU.  Patient reports approximately 1 week ago she shot up heroin into her left collarbone area, she has been using this area for the past year and a half.  Subjective / 24h Interval events: Doing well this morning, complains of some pain in the collarbone area but not too bad.  Assesement and Plan: Principal Problem:   Infection of skin of neck Active Problems:   Opioid dependence (HCC)   Benzodiazepine dependence (HCC)   Heroin abuse (HCC)   IVDU (intravenous drug user)   Polysubstance abuse (HCC)   Bipolar 1 disorder (HCC)   History of ETOH abuse   Principal problem Skin of neck cellulitis-she is greater risk for MRSA, has been placed on Vanco and cefepime, continue.  She is high risk for bacteremia as well, continue to monitor blood cultures for at least 48 hours on intravenous antibiotics.  Active problems Polysubstance abuse-opiates, amphetamines, benzodiazepines as well as alcohol.  High risk for DTs.  Closely monitor on CIWA.  On Suboxone.  She is interested in rehab, [facility] consulted  Scheduled Meds:  buprenorphine-naloxone  1 tablet Sublingual BID   enoxaparin (LOVENOX) injection  40 mg Subcutaneous Q24H   folic acid  1 mg Oral Daily   LORazepam  0-4 mg Oral Q6H   Followed by   Melene Muller ON 05/02/2022] LORazepam  0-4 mg Oral Q12H   multivitamin with minerals  1 tablet Oral Daily   thiamine  100 mg Oral Daily   Or   thiamine  100 mg Intravenous Daily   Continuous Infusions:  ceFEPime (MAXIPIME) IV Stopped (04/30/22 0830)   lactated ringers 100 mL/hr at 04/30/22 0850   vancomycin 750 mg (04/30/22 1053)   PRN Meds:.acetaminophen **OR** acetaminophen, buprenorphine-naloxone, ketorolac, LORazepam **OR** LORazepam, ondansetron **OR**  ondansetron (ZOFRAN) IV  Diet Orders (From admission, onward)     Start     Ordered   04/29/22 2348  Diet regular Room service appropriate? Yes; Fluid consistency: Thin  Diet effective now       Question Answer Comment  Room service appropriate? Yes   Fluid consistency: Thin      04/29/22 2350            DVT prophylaxis: enoxaparin (LOVENOX) injection 40 mg Start: 04/30/22 1000   Lab Results  Component Value Date   PLT 223 04/30/2022      Code Status: Full Code  Family Communication: No family at bedside  Status is: Observation The patient will require care spanning > 2 midnights and should be moved to inpatient because: Needs close monitoring for DTs, withdrawals, and IV antibiotics   Level of care: Med-Surg   Objective: Vitals:   04/30/22 0630 04/30/22 0721 04/30/22 0930 04/30/22 1100  BP: (!) 87/56 91/64 90/60  94/64  Pulse: 70 66 73 68  Resp: 19 18 15 11   Temp:  97.8 F (36.6 C)    TempSrc:  Oral    SpO2: 97% 96% 96% 97%  Weight:      Height:       No intake or output data in the 24 hours ending 04/30/22 1118 Wt Readings from Last 3 Encounters:  04/29/22 54.4 kg  11/19/21 54.3 kg  06/23/20 52.2 kg    Examination:  Constitutional: NAD Eyes: no scleral icterus ENMT:  Mucous membranes are moist.  Neck: normal, supple Respiratory: clear to auscultation bilaterally, no wheezing, no crackles.  Cardiovascular: Regular rate and rhythm, no murmurs / rubs / gallops. Abdomen: non distended, no tenderness. Bowel sounds positive.  Musculoskeletal: no clubbing / cyanosis.  Skin: Cellulitic rash left collarbone, unchanged Neurologic: non focal   Data Reviewed: I have independently reviewed following labs and imaging studies   CBC Recent Labs  Lab 04/29/22 2114 04/30/22 0357  WBC 7.2 8.5  HGB 13.8 12.0  HCT 42.2 36.1  PLT 276 223  MCV 91.3 92.1  MCH 29.9 30.6  MCHC 32.7 33.2  RDW 12.2 12.3  LYMPHSABS 1.8  --   MONOABS 0.4  --   EOSABS 0.0  --    BASOSABS 0.0  --     Recent Labs  Lab 04/29/22 2114 04/29/22 2318 04/30/22 0032 04/30/22 0357  NA 143  --   --  143  K 4.6  --   --  4.2  CL 105  --   --  109  CO2 27  --   --  29  GLUCOSE 88  --   --  107*  BUN 13  --   --  13  CREATININE 0.84  --   --  0.83  CALCIUM 9.8  --   --  8.6*  AST 28  --   --   --   ALT 20  --   --   --   ALKPHOS 101  --   --   --   BILITOT 0.3  --   --   --   ALBUMIN 4.0  --   --   --   LATICACIDVEN 4.2* 3.0* 2.3* 2.3*    ------------------------------------------------------------------------------------------------------------------ No results for input(s): "CHOL", "HDL", "LDLCALC", "TRIG", "CHOLHDL", "LDLDIRECT" in the last 72 hours.  No results found for: "HGBA1C" ------------------------------------------------------------------------------------------------------------------ No results for input(s): "TSH", "T4TOTAL", "T3FREE", "THYROIDAB" in the last 72 hours.  Invalid input(s): "FREET3"  Cardiac Enzymes No results for input(s): "CKMB", "TROPONINI", "MYOGLOBIN" in the last 168 hours.  Invalid input(s): "CK" ------------------------------------------------------------------------------------------------------------------ No results found for: "BNP"  CBG: No results for input(s): "GLUCAP" in the last 168 hours.  Recent Results (from the past 240 hour(s))  MRSA Next Gen by PCR, Nasal     Status: None   Collection Time: 04/30/22 12:38 AM   Specimen: Nasal Mucosa; Nasal Swab  Result Value Ref Range Status   MRSA by PCR Next Gen NOT DETECTED NOT DETECTED Final    Comment: (NOTE) The GeneXpert MRSA Assay (FDA approved for NASAL specimens only), is one component of a comprehensive MRSA colonization surveillance program. It is not intended to diagnose MRSA infection nor to guide or monitor treatment for MRSA infections. Test performance is not FDA approved in patients less than 103 years old. Performed at [facility], [address] [place], [place] [address]      Radiology Studies: DG Chest 2 View  Result Date: 04/29/2022 CLINICAL DATA:  Shortness of breath EXAM: CHEST - 2 VIEW COMPARISON:  11/01/2021 FINDINGS: The heart size and mediastinal contours are within normal limits. Both lungs are clear. Postsurgical changes of the left humeral head. IMPRESSION: No active cardiopulmonary disease. Electronically Signed   By: Jasmine Pang M.D.   On: 04/29/2022 23:20   CT Soft Tissue Neck W Contrast  Result Date: 04/29/2022 CLINICAL DATA:  Soft tissue swelling.  Recent heroin injection. EXAM: CT NECK WITH CONTRAST TECHNIQUE: Multidetector CT imaging of the  neck was performed using the standard protocol following the bolus administration of intravenous contrast. RADIATION DOSE REDUCTION: This exam was performed according to the departmental dose-optimization program which includes automated exposure control, adjustment of the mA and/or kV according to patient size and/or use of iterative reconstruction technique. CONTRAST:  62mL OMNIPAQUE IOHEXOL 300 MG/ML  SOLN COMPARISON:  None Available. FINDINGS: Pharynx and larynx: Normal. No mass or swelling. Salivary glands: No inflammation, mass, or stone. Thyroid: Normal. Lymph nodes: None enlarged or abnormal density. Vascular: Negative. Limited intracranial: Negative. Visualized orbits: Negative. Mastoids and visualized paranasal sinuses: Clear. Skeleton: No acute or aggressive process. Upper chest: Negative. Other: None. IMPRESSION: Normal CT of the neck. Electronically Signed   By: Ulyses Jarred M.D.   On: 04/29/2022 23:16   CT HEAD WO CONTRAST (5MM)  Result Date: 04/29/2022 CLINICAL DATA:  Headache EXAM: CT HEAD WITHOUT CONTRAST TECHNIQUE: Contiguous axial images were obtained from the base of the skull through the vertex without intravenous contrast. RADIATION DOSE REDUCTION: This exam was performed according to the departmental dose-optimization program which  includes automated exposure control, adjustment of the mA and/or kV according to patient size and/or use of iterative reconstruction technique. COMPARISON:  None Available. FINDINGS: Brain: No evidence of acute infarction, hemorrhage, hydrocephalus, extra-axial collection or mass lesion/mass effect. Vascular: No hyperdense vessel or unexpected calcification. Skull: Normal. Negative for fracture or focal lesion. Sinuses/Orbits: The visualized paranasal sinuses are essentially clear. The mastoid air cells are unopacified. Other: None. IMPRESSION: Normal head CT. Electronically Signed   By: Julian Hy M.D.   On: 04/29/2022 23:07     Marzetta Board, MD, PhD [facility]  Between 7 am - 7 pm I am available, please contact me via Amion (for emergencies) or Securechat (non urgent messages)  Between 7 pm - 7 am I am not available, please contact night coverage MD/APP via Amion

## 2022-04-30 NOTE — Progress Notes (Signed)
Chaplain met with Naturi and provided support.  She is hoping to get off of methadone and on suboxone.  She has not had her mental health resources since she lost her insurance.  She also lost her wife to an overdose in 2017.  Chaplain provided resources on grief counseling, mental health care on a sliding scale and other resources in town.  Chaplain will follow up on Monday if patient is still here.  Chaplain Janne Napoleon, [place] PAger, [phone]

## 2022-05-01 DIAGNOSIS — L089 Local infection of the skin and subcutaneous tissue, unspecified: Secondary | ICD-10-CM | POA: Diagnosis not present

## 2022-05-01 MED ORDER — DOXYCYCLINE HYCLATE 100 MG PO TABS
100.0000 mg | ORAL_TABLET | Freq: Two times a day (BID) | ORAL | 0 refills | Status: AC
Start: 2022-05-01 — End: 2022-05-08

## 2022-05-01 MED ORDER — METHADONE HCL 10 MG PO TABS
50.0000 mg | ORAL_TABLET | Freq: Every day | ORAL | Status: DC
Start: 2022-05-01 — End: 2022-05-01

## 2022-05-01 MED ORDER — METHADONE HCL 10 MG PO TABS
70.0000 mg | ORAL_TABLET | Freq: Every day | ORAL | Status: DC
  Administered 2022-05-01: 70 mg via ORAL
  Filled 2022-05-01: qty 7

## 2022-05-01 NOTE — Discharge Summary (Signed)
Physician Discharge Summary  Rita Carey HKV:[id number] DOB: 23-Jul-1981 DOA: 04/29/2022  PCP: Pcp, No  Admit date: 04/29/2022 Discharge date: 05/01/2022  Admitted From: home Disposition:  home  Recommendations for Outpatient Follow-up:  Follow up with PCP in 1-2 weeks Follow-up with methadone clinic tomorrow  Home Health: None Equipment/Devices: none  Discharge Condition: stable CODE STATUS: Full code Diet Orders (From admission, onward)     Start     Ordered   04/29/22 2348  Diet regular Room service appropriate? Yes; Fluid consistency: Thin  Diet effective now       Question Answer Comment  Room service appropriate? Yes   Fluid consistency: Thin      04/29/22 2350           HPI: Per admitting MD, Rita Carey is a 41 y.o. female with medical history significant of polysubstance abuse.  Pt with ongoing IVDU.  Patient reports approximately 1 week ago she shot up heroin into her left collarbone area, she has been using this area for the past year and a half. Area now painful with skin changes, erythema, and central necrosis. No SI/HI.  Hospital Course / Discharge diagnoses: Principal Problem:   Infection of skin of neck Active Problems:   Opioid dependence (HCC)   Benzodiazepine dependence (HCC)   Heroin abuse (HCC)   IVDU (intravenous drug user)   Polysubstance abuse (HCC)   Bipolar 1 disorder (HCC)   History of ETOH abuse   Cellulitis   Principal problem Skin of neck cellulitis-patient was admitted to the hospital and placed on broad-spectrum antibiotics.  Underwent a CT scan of the neck which fortunately did not show any abscesses or deeper infection.  With antibiotics, her cellulitic changes are improving.  She is afebrile, WBC is normal, and will be transitioned to doxycycline for 7 additional days upon discharge.   Active problems Polysubstance abuse-opiates, amphetamines, benzodiazepines as well as alcohol.  She was given methadone here as she gets  that as an outpatient in her methadone clinic.  Concern from overnight RN was vaping in the room and also that she has syringes and needles in the backpack.  Social worker consulted and patient was given extensive resources.   Sepsis ruled out   Discharge Instructions   Allergies as of 05/01/2022   No Known Allergies      Medication List     TAKE these medications    doxycycline 100 MG tablet Commonly known as: VIBRA-TABS Take 1 tablet (100 mg total) by mouth 2 (two) times daily for 7 days.   ibuprofen 200 MG tablet Commonly known as: ADVIL Take 400 mg by mouth daily as needed for mild pain.   METHADONE HCL PO Take 70 mg by mouth daily.        Follow-up Information     [facility] Follow up.   Why: you can get help with suboxone Contact information: [address] [place] [address] [phone]                Procedures/Studies:  DG Chest 2 View  Result Date: 04/29/2022 CLINICAL DATA:  Shortness of breath EXAM: CHEST - 2 VIEW COMPARISON:  11/01/2021 FINDINGS: The heart size and mediastinal contours are within normal limits. Both lungs are clear. Postsurgical changes of the left humeral head. IMPRESSION: No active cardiopulmonary disease. Electronically Signed   By: Jasmine Pang M.D.   On: 04/29/2022 23:20   CT Soft Tissue Neck W Contrast  Result Date: 04/29/2022 CLINICAL DATA:  Soft tissue swelling.  Recent heroin injection. EXAM: CT NECK WITH CONTRAST TECHNIQUE: Multidetector CT imaging of the neck was performed using the standard protocol following the bolus administration of intravenous contrast. RADIATION DOSE REDUCTION: This exam was performed according to the departmental dose-optimization program which includes automated exposure control, adjustment of the mA and/or kV according to patient size and/or use of iterative reconstruction technique. CONTRAST:  63mL OMNIPAQUE IOHEXOL 300 MG/ML  SOLN COMPARISON:  None  Available. FINDINGS: Pharynx and larynx: Normal. No mass or swelling. Salivary glands: No inflammation, mass, or stone. Thyroid: Normal. Lymph nodes: None enlarged or abnormal density. Vascular: Negative. Limited intracranial: Negative. Visualized orbits: Negative. Mastoids and visualized paranasal sinuses: Clear. Skeleton: No acute or aggressive process. Upper chest: Negative. Other: None. IMPRESSION: Normal CT of the neck. Electronically Signed   By: Deatra Robinson M.D.   On: 04/29/2022 23:16   CT HEAD WO CONTRAST ( )  Result Date: 04/29/2022 CLINICAL DATA:  Headache EXAM: CT HEAD WITHOUT CONTRAST TECHNIQUE: Contiguous axial images were obtained from the base of the skull through the vertex without intravenous contrast. RADIATION DOSE REDUCTION: This exam was performed according to the departmental dose-optimization program which includes automated exposure control, adjustment of the mA and/or kV according to patient size and/or use of iterative reconstruction technique. COMPARISON:  None Available. FINDINGS: Brain: No evidence of acute infarction, hemorrhage, hydrocephalus, extra-axial collection or mass lesion/mass effect. Vascular: No hyperdense vessel or unexpected calcification. Skull: Normal. Negative for fracture or focal lesion. Sinuses/Orbits: The visualized paranasal sinuses are essentially clear. The mastoid air cells are unopacified. Other: None. IMPRESSION: Normal head CT. Electronically Signed   By: Charline Bills M.D.   On: 04/29/2022 23:07     Subjective: - no chest pain, shortness of breath, no abdominal pain, nausea or vomiting.   Discharge Exam: BP 105/70 (BP Location: Right Arm)   Pulse 90   Temp 97.9 F (36.6 C) (Oral)   Resp 16   Ht 5\' 4"  (1.626 m)   Wt 54.4 kg   LMP 04/28/2022 (Approximate)   SpO2 100%   BMI 20.60 kg/m   General: Pt is alert, awake, not in acute distress Cardiovascular: RRR, S1/S2 +, no rubs, no gallops Respiratory: CTA bilaterally, no wheezing,  no rhonchi Abdominal: Soft, NT, ND, bowel sounds + Extremities: no edema, no cyanosis   The results of significant diagnostics from this hospitalization (including imaging, microbiology, ancillary and laboratory) are listed below for reference.     Microbiology: Recent Results (from the past 240 hour(s))  Blood culture (routine x 2)     Status: None (Preliminary result)   Collection Time: 04/29/22  9:14 PM   Specimen: BLOOD  Result Value Ref Range Status   Specimen Description   Final    BLOOD BLOOD LEFT FOREARM Performed at [facility], [address] [place], [place]    Special Requests   Final    BOTTLES DRAWN AEROBIC AND ANAEROBIC Blood Culture results may not be optimal due to an excessive volume of blood received in culture bottles Performed at [facility], [address] [place], [place]    Culture   Final    NO GROWTH 1 DAY Performed at [facility], [address] [place], [place]    Report Status PENDING  Incomplete  MRSA Next Gen by PCR, Nasal     Status: None   Collection Time: 04/30/22 12:38 AM  Specimen: Nasal Mucosa; Nasal Swab  Result Value Ref Range Status   MRSA by PCR Next Gen NOT DETECTED NOT DETECTED Final    Comment: (NOTE) The GeneXpert MRSA Assay (FDA approved for NASAL specimens only), is one component of a comprehensive MRSA colonization surveillance program. It is not intended to diagnose MRSA infection nor to guide or monitor treatment for MRSA infections. Test performance is not FDA approved in patients less than 75 years old. Performed at [facility], [address], [place], [place] [address]      Labs: Basic Metabolic Panel: Recent Labs  Lab 04/29/22 2114 04/30/22 0357  NA 143 143  K 4.6 4.2  CL 105 109  CO2 27 29  GLUCOSE 88 107*  BUN 13 13  CREATININE 0.84 0.83  CALCIUM 9.8 8.6*   Liver Function Tests: Recent Labs  Lab  04/29/22 2114  AST 28  ALT 20  ALKPHOS 101  BILITOT 0.3  PROT 8.5*  ALBUMIN 4.0   CBC: Recent Labs  Lab 04/29/22 2114 04/30/22 0357  WBC 7.2 8.5  NEUTROABS 5.0  --   HGB 13.8 12.0  HCT 42.2 36.1  MCV 91.3 92.1  PLT 276 223   CBG: No results for input(s): "GLUCAP" in the last 168 hours. Hgb A1c No results for input(s): "HGBA1C" in the last 72 hours. Lipid Profile No results for input(s): "CHOL", "HDL", "LDLCALC", "TRIG", "CHOLHDL", "LDLDIRECT" in the last 72 hours. Thyroid function studies No results for input(s): "TSH", "T4TOTAL", "T3FREE", "THYROIDAB" in the last 72 hours.  Invalid input(s): "FREET3" Urinalysis    Component Value Date/Time   COLORURINE YELLOW 12/28/2013 1207   APPEARANCEUR CLOUDY (A) 12/28/2013 1207   LABSPEC 1.017 12/28/2013 1207   PHURINE 7.5 12/28/2013 1207   GLUCOSEU NEGATIVE 12/28/2013 1207   HGBUR NEGATIVE 12/28/2013 1207   BILIRUBINUR NEGATIVE 12/28/2013 1207   KETONESUR 15 (A) 12/28/2013 1207   PROTEINUR NEGATIVE 12/28/2013 1207   UROBILINOGEN 0.2 12/28/2013 1207   NITRITE NEGATIVE 12/28/2013 1207   LEUKOCYTESUR NEGATIVE 12/28/2013 1207    FURTHER DISCHARGE INSTRUCTIONS:   Get Medicines reviewed and adjusted: Please take all your medications with you for your next visit with your Primary MD   Laboratory/radiological data: Please request your Primary MD to go over all hospital tests and procedure/radiological results at the follow up, please ask your Primary MD to get all Hospital records sent to his/her office.   In some cases, they will be blood work, cultures and biopsy results pending at the time of your discharge. Please request that your primary care M.D. goes through all the records of your hospital data and follows up on these results.   Also Note the following: If you experience worsening of your admission symptoms, develop shortness of breath, life threatening emergency, suicidal or homicidal thoughts you must seek medical  attention immediately by calling 911 or calling your MD immediately  if symptoms less severe.   You must read complete instructions/literature along with all the possible adverse reactions/side effects for all the Medicines you take and that have been prescribed to you. Take any new Medicines after you have completely understood and accpet all the possible adverse reactions/side effects.    Do not drive when taking Pain medications or sleeping medications (Benzodaizepines)   Do not take more than prescribed Pain, Sleep and Anxiety Medications. It is not advisable to combine anxiety,sleep and pain medications without talking with your primary care practitioner   Special Instructions: If you have smoked or  chewed Tobacco  in the last 2 yrs please stop smoking, stop any regular Alcohol  and or any Recreational drug use.   Wear Seat belts while driving.   Please note: You were cared for by a hospitalist during your hospital stay. Once you are discharged, your primary care physician will handle any further medical issues. Please note that NO REFILLS for any discharge medications will be authorized once you are discharged, as it is imperative that you return to your primary care physician (or establish a relationship with a primary care physician if you do not have one) for your post hospital discharge needs so that they can reassess your need for medications and monitor your lab values.  Time coordinating discharge: 35 minutes  SIGNED:  Pamella Pert, MD, PhD 05/01/2022, 1:14 PM

## 2022-05-01 NOTE — Progress Notes (Signed)
Notified MD that overnight staff found 2 needles (insulin syringes) in pt's backpack. MD in to speak w pt, will provide pt resources for OP tx programs and Rx for abx pills and facilitate d/c today.

## 2022-05-01 NOTE — Progress Notes (Signed)
Late entry - After pt was d/c'ed, EVS found this tupperware type box w multiple unknown substances in it hidden underneath the pt's bed. MD, security, CN and [facility] notified of above. MD requested that I include this pic of our findings in the pt's chart for future reference/admissions.

## 2022-05-01 NOTE — TOC Transition Note (Addendum)
Transition of Care [facility]) - CM/SW Discharge Note   Patient Details  Name: Rita Carey MRN: [id number] Date of Birth: 1981-08-11  Transition of Care [facility]) CM/SW Contact:  Lanier Clam, RN Phone Number: 05/01/2022, 11:35 AM   Clinical Narrative: Received call about d/c needs-informed nurse of substance abuse resources already on the d/c instructions. No further CM needs.  -12p-Provided patient w/SA resources, safe syringe exchange resources.Informed nurse about Dr's note. No further CM needs.       Barriers to Discharge: No Barriers Identified   Patient Goals and CMS Choice Patient states their goals for this hospitalization and ongoing recovery are:: Patient unable to verbalize at this time   Choice offered to / list presented to : NA  Discharge Placement                       Discharge Plan and Services In-house Referral: Clinical Social Work   Post Acute Care Choice: NA          DME Arranged: N/A DME Agency: NA                  Social Determinants of Health (SDOH) Interventions     Readmission Risk Interventions     No data to display

## 2022-05-01 NOTE — Discharge Instructions (Signed)
[employer]   Work here? Manage your listings! by [facility] Solution to the Opioid Problem ([place])   [employer] offers participants in [facility] with free harm reduction supplies and naloxone, an opioid overdose reversing medication. This program provides a permanent location, mobile locations, and in some cases deliveries if users are unable to reach the team.  This program provides:  - Clean needles and equipment - Fentanyl test kits - Naloxone - Detox and treatment referrals  When visiting the permanent location, My Pharmacy, ask for a "GCSTOP Kit" at the pharmacy counter. It includes:  - Naloxone - Fentanyl test strips - Choice of longs or shorts - Sharps container - Alcohol pads - Cookers - [employer] - [employer] - [employer] this program provides: medical supplies personal safety addiction & recovery medications for addiction skilled nursing navigating the system case management  Populations this program serves: anyone in need adults young adults teens seniors substance dependency opioid dependency Next Steps Call [phone] to get more info. Contact or go to the nearest location to get services. Available Website:  [employer] Facebook:  [employer] Facebook Eligibility: Anyone can access this program. Languages: English Cost: Free Coverage Area: This program covers residents of the following counties: [place], [place]. Hours and Locations Closest to [place]  0.84 miles away - Get Directions [place], [place] [address]  Notes: The team departs [employer] at approximately 2pm and then travels to the [employer]. Closed Now Sunday: Closed Monday: Closed Tuesday: Closed Wednesday: Closed Thursday: 2:15 PM - 3:00 PM Friday: Closed Saturday: Closed  [employer]  1.07 miles away [employer] [place], [place] [address]  Notes: Look  for the [facility] in the Parking Lot. Closed Now Sunday: Closed Monday: Closed Tuesday: Closed Wednesday: Closed Thursday: 1:00 PM - 2:00 PM Friday: Closed Saturday: Closed  My Pharmacy - Permanent Location  2.21 miles away - Get Directions [address] [place],  [address] Phone: [phone] Closed Now Sunday: Closed Monday: 10:00 AM - 5:00 PM EDT Tuesday: 10:00 AM - 5:00 PM EDT Wednesday: 10:00 AM - 5:00 PM EDT Thursday: 10:00 AM - 5:00 PM EDT Friday: 10:00 AM - 5:00 PM EDT Saturday: Closed

## 2022-05-01 NOTE — Progress Notes (Signed)
Reviewed written d/c instructions w pt and all questions answered. She verbalized understanding. Pt asking repeatedly about an Rx of Ativan or "any other benzo" - spoke w MD and explained that we are unable to give her an Rx of this. Pt crying  and asking for "a taper dose" of Ativan, or just a few of them . Marland Kitchen Again explained that we cannot do this per MD. D/C ambulatory w all belongings in stable condition.

## 2022-05-05 LAB — CULTURE, BLOOD (ROUTINE X 2): Culture: NO GROWTH

## 2022-05-27 ENCOUNTER — Telehealth: Payer: Self-pay

## 2022-05-27 NOTE — Telephone Encounter (Signed)
Patient called the office wanting to be seen to get a note to return to work after having a seizure last week. I explained to her she was no longer our patient since she walked out of her establishment visit with Vernona Rieger. She asked if she could reestablish, and I declined.

## 2022-07-12 ENCOUNTER — Inpatient Hospital Stay (HOSPITAL_COMMUNITY)
Admission: EM | Admit: 2022-07-12 | Discharge: 2022-07-14 | DRG: 392 | Disposition: A | Discharge status: Home / Self Care | Payer: Commercial Managed Care - HMO | Attending: Internal Medicine | Admitting: Internal Medicine

## 2022-07-12 ENCOUNTER — Emergency Department (HOSPITAL_COMMUNITY): Payer: Commercial Managed Care - HMO

## 2022-07-12 ENCOUNTER — Encounter (HOSPITAL_COMMUNITY): Payer: Self-pay

## 2022-07-12 DIAGNOSIS — F1123 Opioid dependence with withdrawal: Secondary | ICD-10-CM | POA: Diagnosis present

## 2022-07-12 DIAGNOSIS — F411 Generalized anxiety disorder: Secondary | ICD-10-CM | POA: Diagnosis present

## 2022-07-12 DIAGNOSIS — Z720 Tobacco use: Secondary | ICD-10-CM | POA: Diagnosis not present

## 2022-07-12 DIAGNOSIS — F10229 Alcohol dependence with intoxication, unspecified: Secondary | ICD-10-CM

## 2022-07-12 DIAGNOSIS — F17210 Nicotine dependence, cigarettes, uncomplicated: Secondary | ICD-10-CM | POA: Diagnosis present

## 2022-07-12 DIAGNOSIS — K5289 Other specified noninfective gastroenteritis and colitis: Principal | ICD-10-CM | POA: Diagnosis present

## 2022-07-12 DIAGNOSIS — F4310 Post-traumatic stress disorder, unspecified: Secondary | ICD-10-CM | POA: Diagnosis present

## 2022-07-12 DIAGNOSIS — F1190 Opioid use, unspecified, uncomplicated: Secondary | ICD-10-CM | POA: Diagnosis not present

## 2022-07-12 DIAGNOSIS — R7989 Other specified abnormal findings of blood chemistry: Secondary | ICD-10-CM

## 2022-07-12 DIAGNOSIS — R519 Headache, unspecified: Secondary | ICD-10-CM | POA: Diagnosis not present

## 2022-07-12 DIAGNOSIS — K529 Noninfective gastroenteritis and colitis, unspecified: Secondary | ICD-10-CM | POA: Diagnosis present

## 2022-07-12 DIAGNOSIS — F319 Bipolar disorder, unspecified: Secondary | ICD-10-CM

## 2022-07-12 DIAGNOSIS — F1910 Other psychoactive substance abuse, uncomplicated: Secondary | ICD-10-CM

## 2022-07-12 DIAGNOSIS — K626 Ulcer of anus and rectum: Secondary | ICD-10-CM | POA: Diagnosis present

## 2022-07-12 DIAGNOSIS — F410 Panic disorder [episodic paroxysmal anxiety] without agoraphobia: Secondary | ICD-10-CM

## 2022-07-12 DIAGNOSIS — F1320 Sedative, hypnotic or anxiolytic dependence, uncomplicated: Secondary | ICD-10-CM

## 2022-07-12 LAB — CBC WITH DIFFERENTIAL/PLATELET
Abs Immature Granulocytes: 0.02 10*3/uL (ref 0.00–0.07)
Basophils Absolute: 0 10*3/uL (ref 0.0–0.1)
Basophils Relative: 1 %
Eosinophils Absolute: 0.1 10*3/uL (ref 0.0–0.5)
Eosinophils Relative: 1 %
HCT: 40.4 % (ref 36.0–46.0)
Hemoglobin: 13.2 g/dL (ref 12.0–15.0)
Immature Granulocytes: 0 %
Lymphocytes Relative: 14 %
Lymphs Abs: 1.1 10*3/uL (ref 0.7–4.0)
MCH: 29.6 pg (ref 26.0–34.0)
MCHC: 32.7 g/dL (ref 30.0–36.0)
MCV: 90.6 fL (ref 80.0–100.0)
Monocytes Absolute: 0.7 10*3/uL (ref 0.1–1.0)
Monocytes Relative: 9 %
Neutro Abs: 5.6 10*3/uL (ref 1.7–7.7)
Neutrophils Relative %: 75 %
Platelets: 203 10*3/uL (ref 150–400)
RBC: 4.46 MIL/uL (ref 3.87–5.11)
RDW: 11.9 % (ref 11.5–15.5)
WBC: 7.5 10*3/uL (ref 4.0–10.5)
nRBC: 0 % (ref 0.0–0.2)

## 2022-07-12 LAB — LACTIC ACID, PLASMA: Lactic Acid, Venous: 0.8 mmol/L (ref 0.5–1.9)

## 2022-07-12 LAB — COMPREHENSIVE METABOLIC PANEL
ALT: 16 U/L (ref 0–44)
AST: 23 U/L (ref 15–41)
Albumin: 3.6 g/dL (ref 3.5–5.0)
Alkaline Phosphatase: 96 U/L (ref 38–126)
Anion gap: 6 (ref 5–15)
BUN: 11 mg/dL (ref 6–20)
CO2: 30 mmol/L (ref 22–32)
Calcium: 9 mg/dL (ref 8.9–10.3)
Chloride: 99 mmol/L (ref 98–111)
Creatinine, Ser: 0.67 mg/dL (ref 0.44–1.00)
GFR, Estimated: >60 mL/min (ref >60)
Glucose, Bld: 107 mg/dL — ABNORMAL HIGH (ref 70–99)
Potassium: 4.1 mmol/L (ref 3.5–5.1)
Sodium: 135 mmol/L (ref 135–145)
Total Bilirubin: 0.3 mg/dL (ref 0.3–1.2)
Total Protein: 7.6 g/dL (ref 6.5–8.1)

## 2022-07-12 LAB — LIPASE, BLOOD: Lipase: 26 U/L (ref 11–51)

## 2022-07-12 LAB — URINALYSIS, ROUTINE W REFLEX MICROSCOPIC
Bilirubin Urine: NEGATIVE
Glucose, UA: NEGATIVE mg/dL
Ketones, ur: NEGATIVE mg/dL
Nitrite: NEGATIVE
Protein, ur: 30 mg/dL — AB
Specific Gravity, Urine: 1.024 (ref 1.005–1.030)
pH: 5 (ref 5.0–8.0)

## 2022-07-12 LAB — I-STAT BETA HCG BLOOD, ED (MC, WL, AP ONLY): I-stat hCG, quantitative: 7.2 m[IU]/mL — ABNORMAL HIGH (ref <5)

## 2022-07-12 MED ORDER — ONDANSETRON HCL 4 MG PO TABS
4.0000 mg | ORAL_TABLET | Freq: Four times a day (QID) | ORAL | Status: DC | PRN
  Administered 2022-07-13: 4 mg via ORAL
  Filled 2022-07-12: qty 1

## 2022-07-12 MED ORDER — IOHEXOL 300 MG/ML  SOLN
100.0000 mL | Freq: Once | INTRAMUSCULAR | Status: AC | PRN
  Administered 2022-07-12: 100 mL via INTRAVENOUS

## 2022-07-12 MED ORDER — KETOROLAC TROMETHAMINE 15 MG/ML IJ SOLN
15.0000 mg | Freq: Once | INTRAMUSCULAR | Status: AC
Start: 2022-07-12 — End: 2022-07-12
  Administered 2022-07-12: 15 mg via INTRAVENOUS
  Filled 2022-07-12: qty 1

## 2022-07-12 MED ORDER — MORPHINE SULFATE (PF) 4 MG/ML IV SOLN
6.0000 mg | Freq: Once | INTRAVENOUS | Status: AC
  Administered 2022-07-12: 6 mg via INTRAVENOUS
  Filled 2022-07-12: qty 2

## 2022-07-12 MED ORDER — ACETAMINOPHEN 500 MG PO TABS
1000.0000 mg | ORAL_TABLET | ORAL | Status: AC
  Administered 2022-07-12: 1000 mg via ORAL
  Filled 2022-07-12: qty 2

## 2022-07-12 MED ORDER — METHADONE HCL 10 MG/ML PO CONC
67.0000 mg | Freq: Every day | ORAL | Status: DC
  Administered 2022-07-13: 67 mg via ORAL
  Filled 2022-07-12: qty 10

## 2022-07-12 MED ORDER — SODIUM CHLORIDE 0.9 % IV SOLN: INTRAVENOUS | Status: DC

## 2022-07-12 MED ORDER — LORAZEPAM 1 MG PO TABS
1.0000 mg | ORAL_TABLET | ORAL | Status: AC
  Administered 2022-07-12: 1 mg via ORAL
  Filled 2022-07-12: qty 1

## 2022-07-12 MED ORDER — METRONIDAZOLE 500 MG/100ML IV SOLN
500.0000 mg | Freq: Two times a day (BID) | INTRAVENOUS | Status: DC
  Administered 2022-07-12 – 2022-07-14 (×4): 500 mg via INTRAVENOUS
  Filled 2022-07-12 (×4): qty 100

## 2022-07-12 MED ORDER — SODIUM CHLORIDE 0.9 % IV SOLN
3.0000 g | Freq: Once | INTRAVENOUS | Status: AC
  Administered 2022-07-12: 3 g via INTRAVENOUS
  Filled 2022-07-12: qty 8

## 2022-07-12 MED ORDER — ONDANSETRON HCL 4 MG/2ML IJ SOLN
4.0000 mg | Freq: Four times a day (QID) | INTRAMUSCULAR | Status: DC | PRN
  Administered 2022-07-13: 4 mg via INTRAVENOUS
  Filled 2022-07-12: qty 2

## 2022-07-12 MED ORDER — SODIUM CHLORIDE 0.9 % IV SOLN
2.0000 g | INTRAVENOUS | Status: DC
  Administered 2022-07-12 – 2022-07-13 (×2): 2 g via INTRAVENOUS
  Filled 2022-07-12 (×4): qty 20

## 2022-07-12 MED ORDER — LACTATED RINGERS IV BOLUS
1000.0000 mL | Freq: Once | INTRAVENOUS | Status: AC
  Administered 2022-07-12: 1000 mL via INTRAVENOUS

## 2022-07-12 NOTE — ED Provider Notes (Signed)
Rita Carey   CSN: [id number] Arrival date & time: 07/12/22  0930     History  Chief Complaint  Patient presents with   Abdominal Pain   Dizziness    Rita Carey is a 41 y.o. female.  41 year old female with a history of anxiety and panic disorder, PTSD, and benzodiazepine/opiate abuse who presents to the emergency department with abdominal pain.  Patient states that for the past 3 days she has been having left lower quadrant abdominal pain.  Says that it is intermittent lasting several hours at a time and is an aching pain.  Says it was gradual in onset.  Says that she has also been having severe diarrhea every 15 minutes.  Says that she has pain while having a bowel movement but that it improved shortly afterwards.  Did try IV heroin 3 days ago due to pain.  Says that she has been compliant with her methadone 67 mg daily with last dose this morning that was obtained from [facility].  Denies any dysuria or frequency.  No vaginal discharge and has not been sexually active in over 6 years.  Denies any nausea or vomiting.  Did have mild sweats but denies any fevers.  Denies any recent or frequent marijuana use.  No alcohol use.   Past Medical History:  Diagnosis Date   Anxiety    Anxiety    Depression    Insomnia    Polysubstance abuse ([facility])    Seizures ([facility])       Home Medications Prior to Admission medications   Medication Sig Start Date End Date Taking? Authorizing Provider  EXCEDRIN MIGRAINE [phone] MG tablet Take 1 tablet by mouth every 6 (six) hours as needed for headache or migraine.   Yes [provider]  methadone (DOLOPHINE) 10 MG/ML solution Take 67 mg by mouth in the morning.   Yes [provider]  METHADONE HCL PO Take 67 mg by mouth in the morning.    [provider]      Allergies    Patient has no known allergies.    Review of Systems   Review of Systems  Physical  Exam Updated Vital Signs BP (!) 95/59   Pulse 66   Temp 98.1 F (36.7 C) (Oral)   Resp 18   LMP  (Within Days)   SpO2 96%  Physical Exam Vitals and nursing Carey reviewed.  Constitutional:      General: She is not in acute distress.    Appearance: She is well-developed.  HENT:     Head: Normocephalic and atraumatic.     Right Ear: External ear normal.     Left Ear: External ear normal.     Nose: Nose normal.     Mouth/Throat:     Mouth: Mucous membranes are dry.     Pharynx: Oropharynx is clear.  Eyes:     Extraocular Movements: Extraocular movements intact.     Conjunctiva/sclera: Conjunctivae normal.     Pupils: Pupils are equal, round, and reactive to light.  Cardiovascular:     Rate and Rhythm: Normal rate and regular rhythm.     Heart sounds: No murmur heard. Pulmonary:     Effort: Pulmonary effort is normal. No respiratory distress.     Breath sounds: Normal breath sounds.  Abdominal:     General: Abdomen is flat. There is no distension.     Palpations: Abdomen is soft. There is no mass.  Tenderness: There is abdominal tenderness (LLQ). There is no right CVA tenderness, left CVA tenderness or guarding.  Musculoskeletal:        General: No swelling.     Cervical back: Normal range of motion and neck supple.     Right lower leg: No edema.     Left lower leg: No edema.  Skin:    General: Skin is warm and dry.     Capillary Refill: Capillary refill takes 2 to 3 seconds.     Comments: No piloerection noted  Neurological:     Mental Status: She is alert and oriented to person, place, and time. Mental status is at baseline.  Psychiatric:        Mood and Affect: Mood normal.     ED Results / Procedures / Treatments   Labs (all labs ordered are listed, but only abnormal results are displayed) Labs Reviewed  COMPREHENSIVE METABOLIC PANEL - Abnormal; Notable for the following components:      Result Value   Glucose, Bld 107 (*)    All other components within  normal limits  URINALYSIS, ROUTINE W REFLEX MICROSCOPIC - Abnormal; Notable for the following components:   APPearance HAZY (*)    Hgb urine dipstick SMALL (*)    Protein, ur 30 (*)    Leukocytes,Ua MODERATE (*)    Bacteria, UA RARE (*)    Non Squamous Epithelial 0-5 (*)    All other components within normal limits  I-STAT BETA HCG BLOOD, ED ([facility], [facility], [facility] ONLY) - Abnormal; Notable for the following components:   I-stat hCG, quantitative 7.2 (*)    All other components within normal limits  GASTROINTESTINAL PANEL BY PCR, STOOL (REPLACES STOOL CULTURE)  C DIFFICILE QUICK SCREEN W PCR REFLEX    URINE CULTURE  LIPASE, BLOOD  CBC WITH DIFFERENTIAL/PLATELET  LACTIC ACID, PLASMA    EKG EKG Interpretation  Date/Time:  Tuesday July 12 2022 10:11:08 EDT Ventricular Rate:  71 PR Interval:  118 QRS Duration: 93 QT Interval:  402 QTC Calculation: 437 R Axis:   68 Text Interpretation: Sinus rhythm Borderline short PR interval Consider left ventricular hypertrophy Confirmed by Margaretmary Eddy [id number]) on 07/12/2022 10:31:53 AM  Radiology CT ABDOMEN PELVIS W CONTRAST  Result Date: 07/12/2022 CLINICAL DATA:  Left lower quadrant abdominal pain with diarrhea x5 days. EXAM: CT ABDOMEN AND PELVIS WITH CONTRAST TECHNIQUE: Multidetector CT imaging of the abdomen and pelvis was performed using the standard protocol following bolus administration of intravenous contrast. RADIATION DOSE REDUCTION: This exam was performed according to the departmental dose-optimization program which includes automated exposure control, adjustment of the mA and/or kV according to patient size and/or use of iterative reconstruction technique. CONTRAST:  167mL OMNIPAQUE IOHEXOL 300 MG/ML  SOLN COMPARISON:  CT June 14, 2016 FINDINGS: Lower chest: Hypoventilatory change in the lung bases. Hepatobiliary: Subcentimeter hypodense lesion in the right lobe of the liver on image 22/2 is technically too small to accurately  characterize but statistically likely to reflect a benign cyst or hemangioma. Gallbladder is nondistended. No biliary ductal dilation Pancreas: No pancreatic ductal dilation or evidence of acute inflammation Spleen: No splenomegaly. Adrenals/Urinary Tract: Bilateral adrenal glands appear normal. No hydronephrosis. Kidneys demonstrate symmetric enhancement. Mild wall thickening of an incompletely distended urinary bladder. Stomach/Bowel: No radiopaque enteric contrast material was administered. Stomach is minimally distended limiting evaluation. No pathologic dilation of small or large bowel. The appendix and terminal ileum appear normal. Mild descending and sigmoid colonic wall thickening extending through the rectum  Vascular/Lymphatic: Normal caliber abdominal aorta. No pathologically enlarged abdominal or pelvic lymph nodes. Reproductive: Uterus and bilateral adnexa are unremarkable. Other: No significant abdominopelvic free fluid. Musculoskeletal: No acute osseous abnormality IMPRESSION: 1. Mild descending and sigmoid colonic wall thickening extending through the rectum, suggestive of a nonspecific infectious or inflammatory rectocolitis. 2. Mild wall thickening of an incompletely distended urinary bladder. Correlate with urinalysis to exclude cystitis. Electronically Signed   By: Dahlia Bailiff M.D.   On: 07/12/2022 13:15    Procedures Procedures   Medications Ordered in ED Medications  acetaminophen (TYLENOL) tablet 1,000 mg (1,000 mg Oral Given 07/12/22 1050)  ketorolac (TORADOL) 15 MG/ML injection 15 mg (15 mg Intravenous Given 07/12/22 1050)  lactated ringers bolus 1,000 mL (1,000 mLs Intravenous New Bag/Given 07/12/22 1050)  LORazepam (ATIVAN) tablet 1 mg (1 mg Oral Given 07/12/22 1050)  iohexol (OMNIPAQUE) 300 MG/ML solution 100 mL (100 mLs Intravenous Contrast Given 07/12/22 1255)  Ampicillin-Sulbactam (UNASYN) 3 g in sodium chloride 0.9 % 100 mL IVPB (0 g Intravenous Stopped 07/12/22 1438)   morphine (PF) 4 MG/ML injection 6 mg (6 mg Intravenous Given 07/12/22 1406)    ED Course/ Medical Decision Making/ A&P Clinical Course as of 07/12/22 2013  Tue Jul 12, 2022  1331 CT ABDOMEN PELVIS W CONTRAST IMPRESSION: 1. Mild descending and sigmoid colonic wall thickening extending through the rectum, suggestive of a nonspecific infectious or inflammatory rectocolitis. 2. Mild wall thickening of an incompletely distended urinary bladder. Correlate with urinalysis to exclude cystitis. [RP]  [id number] Spoke with Dr Therisa Doyne from [facility] who will evaluate the patient.  [RP]  [id number] Spoke with Dr Marylyn Ishihara the hospitalist who will evaluate the patient.  [RP]    Clinical Course User Index [RP] Fransico Meadow, MD                           Medical Decision Making Amount and/or Complexity of Data Reviewed Labs: ordered. Radiology: ordered. Decision-making details documented in ED Course.  Risk OTC drugs. Prescription drug management. Decision regarding hospitalization.   JESALYNN EVERETTE is a 41 y.o. female with comorbidities that complicate the patient evaluation including a history of anxiety and panic disorder, PTSD, and benzodiazepine/opiate abuse who presents to the emergency department with abdominal pain.  This patient presents to the ED for concern of complaints listed in HPI, this involves an extensive number of treatment options, and is a complaint that carries with it a high risk of complications and morbidity.   Initial Ddx:  Diverticulitis, colitis, ovarian pathology such as torsion, cyst, or STI  MDM:  The patient likely has diverticulitis or colitis given her GI symptoms.  Also considered ovarian pathology but given the fact that she is also having diarrhea that changes the symptoms and not currently sexually active feel this is much less likely.  Plan:  Labs CT abdomen pelvis with IV contrast Pain medication  ED Summary:  Labs were obtained which showed possible UTI but  patient denies any symptoms at this time.  Remainder of labs are unremarkable aside from a mildly elevated i-STAT hCG but patient was interviewed again and reports that there is no way that she could be pregnant.  So feel that this is a false positive especially given the very low value.  She did have an abdominal CT which showed evidence of colitis.  Stool samples ordered and GI was consulted.  Patient admitted to medicine for further management and was given Unasyn  for empiric treatment of presumed infectious colitis.  Urine culture also sent to assess for possible UTI.  Dispo: Admit to Floor  Records reviewed Care Everywhere The following labs were independently interpreted: Urinalysis and show  possible UTI I independently reviewed the following imaging with scope of interpretation limited to determining acute life threatening conditions related to emergency care: CT Abdomen/Pelvis, which revealed  proctitis/colitis   I personally reviewed and interpreted cardiac monitoring: normal sinus rhythm  I personally reviewed and interpreted the pt's EKG: see above for interpretation  I have reviewed the patients home medications and made adjustments as needed Consults: Gastroenterology Social Determinants of health:  IVDU  Final Clinical Impression(s) / ED Diagnoses Final diagnoses:  Colitis    Rx / DC Orders ED Discharge Orders     None         Fransico Meadow, MD 07/12/22 2013

## 2022-07-12 NOTE — H&P (Signed)
History and Physical    Patient: Rita Carey:[id number] DOB: November 24, 1980 DOA: 07/12/2022 DOS: the patient was seen and examined on 07/12/2022 PCP: Pcp, No  Patient coming from: Home  Chief Complaint:  Chief Complaint  Patient presents with   Abdominal Pain   Dizziness   HPI: Rita Carey is a 41 y.o. female with medical history significant of polysubstance abuse, anxiety. Presenting with abdominal pain. Her symptoms started 5 days ago. She had generalized cramps initially. It was followed by frequent episodes of diarrhea. She says she was unable to stay away from the bathroom for very long. She didn't have any F. She did have an episode of N/V. As her diarrhea continued, she became more weak and dizzy. She didn't try any medications to help. When her symptoms did not improve this morning, she decided to come to the ED for evaluation. She denies any other aggravating or alleviating factors.    Review of Systems: As mentioned in the history of present illness. All other systems reviewed and are negative. Past Medical History:  Diagnosis Date   Anxiety    Anxiety    Depression    Insomnia    Polysubstance abuse ([facility])    Seizures ([facility])    Past Surgical History:  Procedure Laterality Date   NO PAST SURGERIES     SHOULDER SURGERY     WISDOM TOOTH EXTRACTION     Social History:  reports that she has been smoking cigarettes. She has a 11.00 pack-year smoking history. She has never used smokeless tobacco. She reports current alcohol use of about 21.0 standard drinks of alcohol per week. She reports current drug use. Drugs: Marijuana, Oxycodone, and Heroin.  No Known Allergies  FamHx Reviewed. Non-contributory.  Prior to Admission medications   Medication Sig Start Date End Date Taking? Authorizing Provider  ibuprofen (ADVIL,MOTRIN) 200 MG tablet Take 400 mg by mouth daily as needed for mild pain.    [provider]  METHADONE HCL PO Take 70 mg by mouth daily.     [provider]    Physical Exam: Vitals:   07/12/22 1145 07/12/22 1306 07/12/22 1345 07/12/22 1346  BP:      Pulse:  64 (!) 58 64  Resp: 16 13 13 13   Temp:      TempSrc:      SpO2:  98% 98% 97%   General: 41 y.o. female resting in bed in NAD Eyes: PERRL, normal sclera ENMT: Nares patent w/o discharge, orophaynx clear, dentition normal, ears w/o discharge/lesions/ulcers Neck: Supple, trachea midline Cardiovascular: RRR, +S1, S2, no m/g/r, equal pulses throughout Respiratory: CTABL, no w/r/r, normal WOB GI: BS+, NDNT, no masses noted, no organomegaly noted MSK: No e/c/c Neuro: A&O x 3, no focal deficits Psyc: Appropriate interaction and affect, calm/cooperative  Data Reviewed:  Results for orders placed or performed during the hospital encounter of 07/12/22 (from the past 24 hour(s))  Comprehensive metabolic panel     Status: Abnormal   Collection Time: 07/12/22 10:23 AM  Result Value Ref Range   Sodium 135 135 - 145 mmol/L   Potassium 4.1 3.5 - 5.1 mmol/L   Chloride 99 98 - 111 mmol/L   CO2 30 22 - 32 mmol/L   Glucose, Bld 107 (H) 70 - 99 mg/dL   BUN 11 6 - 20 mg/dL   Creatinine, Ser 09/11/22 0.44 - 1.00 mg/dL   Calcium 9.0 8.9 - 4.25 mg/dL   Total Protein 7.6 6.5 - 8.1 g/dL  Albumin 3.6 3.5 - 5.0 g/dL   AST 23 15 - 41 U/L   ALT 16 0 - 44 U/L   Alkaline Phosphatase 96 38 - 126 U/L   Total Bilirubin 0.3 0.3 - 1.2 mg/dL   GFR, Estimated >37 >36 mL/min   Anion gap 6 5 - 15  Lipase, blood     Status: None   Collection Time: 07/12/22 10:23 AM  Result Value Ref Range   Lipase 26 11 - 51 U/L  CBC with Diff     Status: None   Collection Time: 07/12/22 10:23 AM  Result Value Ref Range   WBC 7.5 4.0 - 10.5 K/uL   RBC 4.46 3.87 - 5.11 MIL/uL   Hemoglobin 13.2 12.0 - 15.0 g/dL   HCT 68.1 59.4 - 70.7 %   MCV 90.6 80.0 - 100.0 fL   MCH 29.6 26.0 - 34.0 pg   MCHC 32.7 30.0 - 36.0 g/dL   RDW 61.5 18.3 - 43.7 %   Platelets 203 150 - 400 K/uL   nRBC 0.0 0.0 - 0.2 %    Neutrophils Relative % 75 %   Neutro Abs 5.6 1.7 - 7.7 K/uL   Lymphocytes Relative 14 %   Lymphs Abs 1.1 0.7 - 4.0 K/uL   Monocytes Relative 9 %   Monocytes Absolute 0.7 0.1 - 1.0 K/uL   Eosinophils Relative 1 %   Eosinophils Absolute 0.1 0.0 - 0.5 K/uL   Basophils Relative 1 %   Basophils Absolute 0.0 0.0 - 0.1 K/uL   Immature Granulocytes 0 %   Abs Immature Granulocytes 0.02 0.00 - 0.07 K/uL  Lactic acid, plasma     Status: None   Collection Time: 07/12/22 10:24 AM  Result Value Ref Range   Lactic Acid, Venous 0.8 0.5 - 1.9 mmol/L  I-Stat beta hCG blood, ED     Status: Abnormal   Collection Time: 07/12/22 10:45 AM  Result Value Ref Range   I-stat hCG, quantitative 7.2 (H) <5 mIU/mL   Comment 3          Urinalysis, Routine w reflex microscopic     Status: Abnormal   Collection Time: 07/12/22 10:58 AM  Result Value Ref Range   Color, Urine YELLOW YELLOW   APPearance HAZY (A) CLEAR   Specific Gravity, Urine 1.024 1.005 - 1.030   pH 5.0 5.0 - 8.0   Glucose, UA NEGATIVE NEGATIVE mg/dL   Hgb urine dipstick SMALL (A) NEGATIVE   Bilirubin Urine NEGATIVE NEGATIVE   Ketones, ur NEGATIVE NEGATIVE mg/dL   Protein, ur 30 (A) NEGATIVE mg/dL   Nitrite NEGATIVE NEGATIVE   Leukocytes,Ua MODERATE (A) NEGATIVE   RBC / HPF 6-10 0 - 5 RBC/hpf   WBC, UA 6-10 0 - 5 WBC/hpf   Bacteria, UA RARE (A) NONE SEEN   Squamous Epithelial / LPF 6-10 0 - 5   Mucus PRESENT    Non Squamous Epithelial 0-5 (A) NONE SEEN   CT ab/pelvis 1. Mild descending and sigmoid colonic wall thickening extending through the rectum, suggestive of a nonspecific infectious or inflammatory rectocolitis. 2. Mild wall thickening of an incompletely distended urinary bladder. Correlate with urinalysis to exclude cystitis.  Assessment and Plan: Colitis     - admit to inpt, med-surg     - started on unasyn in ED; change that to rocephin, flagyl     - C diff, GI PCR ordered     - CLD per GI     - [facility]  onboard,  appreciate assistance     - fluids, pain control, anti-emetics  Chronic methadone use     - continue home regimen  Elevated quantitative hCG     - she denies possibility of pregnancy as she has had no recent sexual activity  Tobacco abuse     - counsel against further use  Anxiety     - continue home regimen  Advance Care Planning:   Code Status: FULL  Consults: [facility]  Family Communication: None at bedside  Severity of Illness: The appropriate patient status for this patient is OBSERVATION. Observation status is judged to be reasonable and necessary in order to provide the required intensity of service to ensure the patient's safety. The patient's presenting symptoms, physical exam findings, and initial radiographic and laboratory data in the context of their medical condition is felt to place them at decreased risk for further clinical deterioration. Furthermore, it is anticipated that the patient will be medically stable for discharge from the hospital within 2 midnights of admission.   Time spent in coordination of this H&P: 60 minutes  Author: Jonnie Finner, DO 07/12/2022 2:14 PM  For on call review [email or web address].

## 2022-07-12 NOTE — ED Triage Notes (Signed)
Pt c/o generalized abd pain with diarrhea for five days. Pt also c/o DOE, denies CP.

## 2022-07-12 NOTE — Consult Note (Signed)
Referring Provider: [facility] Primary Care Physician:  Pcp, No Primary Gastroenterologist:  Althia Forts  Reason for Consultation: Abdominal pain, nausea, vomiting, diarrhea  HPI: Rita Carey is a 41 y.o. female past medical history of anxiety, depression, insomnia, polysubstance abuse.   Patient reports starting 4 to 5 days ago she began to have left upper quadrant abdominal pain with associated nausea, vomiting and diarrhea.  Notes emesis was nonbloody.  Diarrhea was to be in consistency and nonbloody.  The diarrhea began she was having loose stools every 15 minutes.  She has also been feeling increasingly weak and fatigued.  Due to pain, increasing anxiety and fatigue she decided to present to the ED.  In the ED hemoglobin 13.2, WBC 7.5, lipase 26, NA 135, K4.1, BUN 11, CR 0.67, lactic acid 0.8, GI pathogen panel and C. difficile ordered.  CT abdomen pelvis with contrast with 6 rectum and sigmoid colitis, as well as possible cystitis.  [facility] was consulted for evaluation.   She has no previous colonoscopy or EGD.  No previous GI issues.  Denies family history of colon cancer, Crohn's disease, ulcerative colitis. She has recent history of IV heroin use 3 days ago. Denies NSAID use, denies melena, weight loss, hematochezia. Denies previous episodes of abdominal pain, nausea, vomiting, diarrhea. She is not taking any blood thinning medications.  Past Medical History:  Diagnosis Date   Anxiety    Anxiety    Depression    Insomnia    Polysubstance abuse ([place])    Seizures ([place])     Past Surgical History:  Procedure Laterality Date   NO PAST SURGERIES     SHOULDER SURGERY     WISDOM TOOTH EXTRACTION      Prior to Admission medications   Medication Sig Start Date End Date Taking? Authorizing Provider  ibuprofen (ADVIL,MOTRIN) 200 MG tablet Take 400 mg by mouth daily as needed for mild pain.    [provider]  METHADONE HCL PO Take 70 mg by mouth daily.    [provider]    Scheduled Meds:   morphine injection  6 mg Intravenous Once   Continuous Infusions:  ampicillin-sulbactam (UNASYN) IV     PRN Meds:.  Allergies as of 07/12/2022   (No Known Allergies)    No family history on file.  Social History   Socioeconomic History   Marital status: Single    Spouse name: Not on file   Number of children: Not on file   Years of education: Not on file   Highest education level: Not on file  Occupational History   Not on file  Tobacco Use   Smoking status: Every Day    Packs/day: 1.00    Years: 11.00    Total pack years: 11.00    Types: Cigarettes   Smokeless tobacco: Never  Substance and Sexual Activity   Alcohol use: Yes    Alcohol/week: 21.0 standard drinks of alcohol    Types: 21 Standard drinks or equivalent per week    Comment: denies   Drug use: Yes    Types: Marijuana, Oxycodone, Heroin    Comment: denies   Sexual activity: Not on file  Other Topics Concern   Not on file  Social History Narrative   Not on file   Social Determinants of Health   Financial Resource Strain: Not on file  Food Insecurity: Not on file  Transportation Needs: Not on file  Physical Activity: Not on file  Stress: Not on file  Social Connections: Not on file  Intimate Partner Violence: Not on file    Review of Systems: All negative except as stated above in HPI.  Physical Exam:Physical Exam Constitutional:      Appearance: She is normal weight. She is ill-appearing.  HENT:     Head: Normocephalic and atraumatic.     Right Ear: External ear normal.     Left Ear: External ear normal.     Nose: Nose normal.     Mouth/Throat:     Mouth: Mucous membranes are moist.  Eyes:     Pupils: Pupils are equal, round, and reactive to light.  Cardiovascular:     Rate and Rhythm: Normal rate and regular rhythm.     Pulses: Normal pulses.     Heart sounds: Normal heart sounds.  Pulmonary:     Effort: Pulmonary effort is normal.     Breath  sounds: Normal breath sounds.  Abdominal:     General: Abdomen is flat. Bowel sounds are normal. There is no distension.     Palpations: Abdomen is soft. There is no mass.     Tenderness: There is abdominal tenderness. There is no guarding (tender to LLQ) or rebound.     Hernia: No hernia is present.  Musculoskeletal:        General: No swelling. Normal range of motion.     Cervical back: Normal range of motion and neck supple.  Skin:    General: Skin is warm and dry.     Comments: Circular scars and wounds scattered on upper and lower extremities  Neurological:     General: No focal deficit present.     Mental Status: She is alert and oriented to person, place, and time. Mental status is at baseline.  Psychiatric:        Mood and Affect: Mood normal.        Behavior: Behavior normal.     Vital signs: Vitals:   07/12/22 1345 07/12/22 1346  BP:    Pulse: (!) 58 64  Resp: 13 13  Temp:    SpO2: 98% 97%        GI:  Lab Results: Recent Labs    07/12/22 1023  WBC 7.5  HGB 13.2  HCT 40.4  PLT 203   BMET Recent Labs    07/12/22 1023  NA 135  K 4.1  CL 99  CO2 30  GLUCOSE 107*  BUN 11  CREATININE 0.67  CALCIUM 9.0   LFT Recent Labs    07/12/22 1023  PROT 7.6  ALBUMIN 3.6  AST 23  ALT 16  ALKPHOS 96  BILITOT 0.3   PT/INR No results for input(s): "LABPROT", "INR" in the last 72 hours.   Studies/Results: CT ABDOMEN PELVIS W CONTRAST  Result Date: 07/12/2022 CLINICAL DATA:  Left lower quadrant abdominal pain with diarrhea x5 days. EXAM: CT ABDOMEN AND PELVIS WITH CONTRAST TECHNIQUE: Multidetector CT imaging of the abdomen and pelvis was performed using the standard protocol following bolus administration of intravenous contrast. RADIATION DOSE REDUCTION: This exam was performed according to the departmental dose-optimization program which includes automated exposure control, adjustment of the mA and/or kV according to patient size and/or use of iterative  reconstruction technique. CONTRAST:  140mL OMNIPAQUE IOHEXOL 300 MG/ML  SOLN COMPARISON:  CT June 14, 2016 FINDINGS: Lower chest: Hypoventilatory change in the lung bases. Hepatobiliary: Subcentimeter hypodense lesion in the right lobe of the liver on image 22/2 is technically too small to accurately characterize but  statistically likely to reflect a benign cyst or hemangioma. Gallbladder is nondistended. No biliary ductal dilation Pancreas: No pancreatic ductal dilation or evidence of acute inflammation Spleen: No splenomegaly. Adrenals/Urinary Tract: Bilateral adrenal glands appear normal. No hydronephrosis. Kidneys demonstrate symmetric enhancement. Mild wall thickening of an incompletely distended urinary bladder. Stomach/Bowel: No radiopaque enteric contrast material was administered. Stomach is minimally distended limiting evaluation. No pathologic dilation of small or large bowel. The appendix and terminal ileum appear normal. Mild descending and sigmoid colonic wall thickening extending through the rectum Vascular/Lymphatic: Normal caliber abdominal aorta. No pathologically enlarged abdominal or pelvic lymph nodes. Reproductive: Uterus and bilateral adnexa are unremarkable. Other: No significant abdominopelvic free fluid. Musculoskeletal: No acute osseous abnormality IMPRESSION: 1. Mild descending and sigmoid colonic wall thickening extending through the rectum, suggestive of a nonspecific infectious or inflammatory rectocolitis. 2. Mild wall thickening of an incompletely distended urinary bladder. Correlate with urinalysis to exclude cystitis. Electronically Signed   By: Dahlia Bailiff M.D.   On: 07/12/2022 13:15    Impression: Abdominal pain Nausea, vomiting Acute diarrhea Substance abuse  Patient presenting with 4 to 5 days of abdominal pain, nausea, vomiting, diarrhea.  No prior episodes with this constellation of symptoms.  Evaluation in the ED lipase was normal, no leukocytosis or anemia,  CMP within normal limits.  No elevation of lactic acid.  Awaiting C. difficile and GI pathogen panel. CT scan with finding of descending, sigmoid, rectal colitis, possible due to infectious or inflammatory etiology.  No history of IBD.   CT AP 07/12/2022 Mild descending and sigmoid colonic wall thickening extending through the rectum, suggestive of a nonspecific infectious or inflammatory rectocolitis. 2. Mild wall thickening of an incompletely distended urinary bladder. Correlate with urinalysis to exclude cystitis   Plan: Follow results of GI pathogen panel and C. Difficile PCR.  If no infectious etiology of diarrhea found consider colonoscopy tentatively Thursday. Continue supportive care Can start clear liquid diet [facility] will follow.   LOS: 0 days   Charlott Rakes  PA-C 07/12/2022, 1:59 PM  Contact #  [phone]

## 2022-07-13 ENCOUNTER — Other Ambulatory Visit: Payer: Self-pay

## 2022-07-13 ENCOUNTER — Encounter (HOSPITAL_COMMUNITY): Payer: Self-pay | Admitting: Internal Medicine

## 2022-07-13 DIAGNOSIS — R7989 Other specified abnormal findings of blood chemistry: Secondary | ICD-10-CM

## 2022-07-13 DIAGNOSIS — K529 Noninfective gastroenteritis and colitis, unspecified: Secondary | ICD-10-CM | POA: Diagnosis not present

## 2022-07-13 DIAGNOSIS — Z720 Tobacco use: Secondary | ICD-10-CM

## 2022-07-13 DIAGNOSIS — F411 Generalized anxiety disorder: Secondary | ICD-10-CM | POA: Diagnosis not present

## 2022-07-13 DIAGNOSIS — F1190 Opioid use, unspecified, uncomplicated: Secondary | ICD-10-CM

## 2022-07-13 LAB — GASTROINTESTINAL PANEL BY PCR, STOOL (REPLACES STOOL CULTURE)

## 2022-07-13 LAB — HCG, SERUM, QUALITATIVE: Preg, Serum: NEGATIVE

## 2022-07-13 LAB — CBC
HCT: 37 % (ref 36.0–46.0)
Hemoglobin: 12.6 g/dL (ref 12.0–15.0)
MCH: 29.8 pg (ref 26.0–34.0)
MCHC: 34.1 g/dL (ref 30.0–36.0)
MCV: 87.5 fL (ref 80.0–100.0)
Platelets: 196 10*3/uL (ref 150–400)
RBC: 4.23 MIL/uL (ref 3.87–5.11)
RDW: 11.9 % (ref 11.5–15.5)
WBC: 6.3 10*3/uL (ref 4.0–10.5)
nRBC: 0 % (ref 0.0–0.2)

## 2022-07-13 LAB — URINE CULTURE: Culture: NO GROWTH

## 2022-07-13 LAB — COMPREHENSIVE METABOLIC PANEL
ALT: 18 U/L (ref 0–44)
AST: 25 U/L (ref 15–41)
Albumin: 3.7 g/dL (ref 3.5–5.0)
Alkaline Phosphatase: 95 U/L (ref 38–126)
Anion gap: 9 (ref 5–15)
BUN: 10 mg/dL (ref 6–20)
CO2: 26 mmol/L (ref 22–32)
Calcium: 8.8 mg/dL — ABNORMAL LOW (ref 8.9–10.3)
Chloride: 103 mmol/L (ref 98–111)
Creatinine, Ser: 0.68 mg/dL (ref 0.44–1.00)
GFR, Estimated: >60 mL/min (ref >60)
Glucose, Bld: 87 mg/dL (ref 70–99)
Potassium: 3.8 mmol/L (ref 3.5–5.1)
Sodium: 138 mmol/L (ref 135–145)
Total Bilirubin: 0.4 mg/dL (ref 0.3–1.2)
Total Protein: 7.7 g/dL (ref 6.5–8.1)

## 2022-07-13 MED ORDER — PEG 3350-KCL-NA BICARB-NACL 420 G PO SOLR
4000.0000 mL | Freq: Once | ORAL | Status: AC
  Administered 2022-07-13: 4000 mL via ORAL

## 2022-07-13 MED ORDER — LORAZEPAM 2 MG/ML IJ SOLN
0.5000 mg | Freq: Once | INTRAMUSCULAR | Status: AC
  Administered 2022-07-13: 0.5 mg via INTRAVENOUS
  Filled 2022-07-13: qty 1

## 2022-07-13 MED ORDER — ACETAMINOPHEN 325 MG PO TABS
650.0000 mg | ORAL_TABLET | ORAL | Status: DC | PRN
  Administered 2022-07-13: 650 mg via ORAL
  Filled 2022-07-13: qty 2

## 2022-07-13 MED ORDER — SUMATRIPTAN SUCCINATE 50 MG PO TABS
50.0000 mg | ORAL_TABLET | ORAL | Status: DC | PRN
  Administered 2022-07-13: 50 mg via ORAL
  Filled 2022-07-13 (×2): qty 1

## 2022-07-13 MED ORDER — THIAMINE MONONITRATE 100 MG PO TABS
100.0000 mg | ORAL_TABLET | Freq: Every day | ORAL | Status: DC
  Administered 2022-07-13: 100 mg via ORAL
  Filled 2022-07-13: qty 1

## 2022-07-13 MED ORDER — ADULT MULTIVITAMIN W/MINERALS CH
1.0000 | ORAL_TABLET | Freq: Every day | ORAL | Status: DC
  Administered 2022-07-13: 1 via ORAL
  Filled 2022-07-13: qty 1

## 2022-07-13 MED ORDER — LORAZEPAM 2 MG/ML IJ SOLN
1.0000 mg | INTRAMUSCULAR | Status: DC | PRN
  Administered 2022-07-13 – 2022-07-14 (×3): 2 mg via INTRAVENOUS
  Administered 2022-07-14: 3 mg via INTRAVENOUS
  Filled 2022-07-13: qty 1
  Filled 2022-07-13: qty 2
  Filled 2022-07-13 (×2): qty 1

## 2022-07-13 MED ORDER — ORAL CARE MOUTH RINSE: 15.0000 mL | OROMUCOSAL | Status: DC | PRN

## 2022-07-13 MED ORDER — THIAMINE HCL 100 MG/ML IJ SOLN
100.0000 mg | Freq: Every day | INTRAMUSCULAR | Status: DC
  Administered 2022-07-14: 100 mg via INTRAVENOUS
  Filled 2022-07-13: qty 2

## 2022-07-13 MED ORDER — FOLIC ACID 1 MG PO TABS
1.0000 mg | ORAL_TABLET | Freq: Every day | ORAL | Status: DC
  Administered 2022-07-13: 1 mg via ORAL
  Filled 2022-07-13: qty 1

## 2022-07-13 MED ORDER — MORPHINE SULFATE (PF) 2 MG/ML IV SOLN
1.0000 mg | INTRAVENOUS | Status: DC | PRN
  Administered 2022-07-13 – 2022-07-14 (×2): 2 mg via INTRAVENOUS
  Filled 2022-07-13 (×2): qty 1

## 2022-07-13 MED ORDER — NICOTINE 21 MG/24HR TD PT24
21.0000 mg | MEDICATED_PATCH | Freq: Every day | TRANSDERMAL | Status: DC
  Administered 2022-07-13 – 2022-07-14 (×2): 21 mg via TRANSDERMAL
  Filled 2022-07-13 (×2): qty 1

## 2022-07-13 MED ORDER — METHADONE HCL 10 MG/ML PO CONC
67.0000 mg | Freq: Every day | ORAL | Status: DC
  Administered 2022-07-14: 67 mg via ORAL
  Filled 2022-07-13: qty 10

## 2022-07-13 MED ORDER — LORAZEPAM 1 MG PO TABS
1.0000 mg | ORAL_TABLET | ORAL | Status: DC | PRN
  Administered 2022-07-13 (×2): 2 mg via ORAL
  Administered 2022-07-14: 3 mg via ORAL
  Filled 2022-07-13 (×2): qty 2
  Filled 2022-07-13: qty 3

## 2022-07-13 NOTE — Progress Notes (Signed)
PROGRESS NOTE    Rita Carey  [id number] DOB: 08-02-1981 DOA: 07/12/2022 PCP: Pcp, No   Chief Complaint  Patient presents with   Abdominal Pain   Dizziness    Brief Narrative:   STEPHANEE Carey is a 41 y.o. female with medical history significant of polysubstance abuse, anxiety. Presenting with abdominal pain. Her symptoms started 5 days ago. She had generalized cramps initially. It was followed by frequent episodes of diarrhea.  CT of the abdomen pelvis shows mild descending and sigmoid colon wall thickening extending through the rectum suggestive for nonspecific infectious or inflammatory rectal colitis  Assessment & Plan:   Principal Problem:   Colitis Active Problems:   Generalized anxiety disorder   Methadone use   Tobacco abuse   Elevated serum hCG  Rectal colitis Rule out infectious process C. difficile PCR and GI PCR are pending Clear liquid diet GI on board and plan for colonoscopy tomorrow Empirically started on on IV antibiotics Symptomatic management with IV fluids antiemetics and pain control.    Chronic methadone use Continue the same    Tobacco abuse Nicotine patch ordered    Anxiety Xanax added.    Elevated serum hCG patient denies any recent sexual activity Repeat serum hCG tomorrow.     DVT prophylaxis:  Code Status: Full code Family Communication: Family at bedside disposition:   Status is: Inpatient Remains inpatient appropriate because: COLITIS.   Level of care: Med-Surg Consultants:  Gastroenterology  Procedures: Scheduled for colonoscopy on 07/14/2022  Antimicrobials: None    Subjective: Headache  Objective: Vitals:   07/13/22 0106 07/13/22 0538 07/13/22 1029 07/13/22 1417  BP: 96/63 96/63 (!) 140/90 117/84  Pulse: 82 82 (!) 101 94  Resp: 17 17 17 17   Temp: 97.8 F (36.6 C) 97.8 F (36.6 C) 98.1 F (36.7 C) 98.1 F (36.7 C)  TempSrc: Oral Oral    SpO2: 99%  100% 100%  Weight:  51.9 kg     Height:  5\' 4"  (1.626 m)      Intake/Output Summary (Last 24 hours) at 07/13/2022 1448 Last data filed at 07/13/2022 1405 Gross per 24 hour  Intake 722.12 ml  Output --  Net 722.12 ml   Filed Weights   07/13/22 0538  Weight: 51.9 kg    Examination:  General exam: Ill appearing lady in mild distress from abdominal pain Respiratory system: Clear to auscultation. Respiratory effort normal. Cardiovascular system: S1 & S2 heard, RRR. No JVD,  No pedal edema. Gastrointestinal system: Abdomen is soft mild Abdominal tenderness, bowel sounds heard. Central nervous system: Alert and oriented. No focal neurological deficits. Extremities: Symmetric 5 x 5 power. Skin: No rashes, lesions or ulcers Psychiatry:  Mood & affect appropriate.     Data Reviewed: I have personally reviewed following labs and imaging studies  CBC: Recent Labs  Lab 07/12/22 1023 07/13/22 0406  WBC 7.5 6.3  NEUTROABS 5.6  --   HGB 13.2 12.6  HCT 40.4 37.0  MCV 90.6 87.5  PLT 203 [id number]    Basic Metabolic Panel: Recent Labs  Lab 07/12/22 1023 07/13/22 0907  NA 135 138  K 4.1 3.8  CL 99 103  CO2 30 26  GLUCOSE 107* 87  BUN 11 10  CREATININE 0.67 0.68  CALCIUM 9.0 8.8*    GFR: Estimated Creatinine Clearance: 75.1 mL/min (by C-G formula based on SCr of 0.68 mg/dL).  Liver Function Tests: Recent Labs  Lab 07/12/22 1023 07/13/22 0907  AST 23 25  ALT 16  18  ALKPHOS 96 95  BILITOT 0.3 0.4  PROT 7.6 7.7  ALBUMIN 3.6 3.7    CBG: No results for input(s): "GLUCAP" in the last 168 hours.   Recent Results (from the past 240 hour(s))  Urine Culture     Status: None   Collection Time: 07/12/22 10:58 AM   Specimen: Urine, Clean Catch  Result Value Ref Range Status   Specimen Description   Final    URINE, CLEAN CATCH Performed at [facility], [place] [address], [place], [place] [address]    Special Requests   Final    NONE Performed at [facility], [place] [address], [place], [place] [address]    Culture   Final    NO GROWTH Performed at [facility], [place] [address], [place], [place] [address]    Report Status 07/13/2022 FINAL  Final         Radiology Studies: CT ABDOMEN PELVIS W CONTRAST  Result Date: 07/12/2022 CLINICAL DATA:  Left lower quadrant abdominal pain with diarrhea x5 days. EXAM: CT ABDOMEN AND PELVIS WITH CONTRAST TECHNIQUE: Multidetector CT imaging of the abdomen and pelvis was performed using the standard protocol following bolus administration of intravenous contrast. RADIATION DOSE REDUCTION: This exam was performed according to the departmental dose-optimization program which includes automated exposure control, adjustment of the mA and/or kV according to patient size and/or use of iterative reconstruction technique. CONTRAST:  142mL OMNIPAQUE IOHEXOL 300 MG/ML  SOLN COMPARISON:  CT June 14, 2016 FINDINGS: Lower chest: Hypoventilatory change in the lung bases. Hepatobiliary: Subcentimeter hypodense lesion in the right lobe of the liver on image 22/2 is technically too small to accurately characterize but statistically likely to reflect a benign cyst or hemangioma. Gallbladder is nondistended. No biliary ductal dilation Pancreas: No pancreatic ductal dilation or evidence of acute inflammation Spleen: No splenomegaly. Adrenals/Urinary Tract: Bilateral adrenal glands appear normal. No hydronephrosis. Kidneys demonstrate symmetric enhancement. Mild wall thickening of an incompletely distended urinary bladder. Stomach/Bowel: No radiopaque enteric contrast material was administered. Stomach is minimally distended limiting evaluation. No pathologic dilation of small or large bowel. The appendix and terminal ileum appear normal. Mild descending and sigmoid colonic wall thickening extending through the rectum Vascular/Lymphatic: Normal caliber abdominal aorta. No pathologically enlarged abdominal or pelvic lymph nodes. Reproductive:  Uterus and bilateral adnexa are unremarkable. Other: No significant abdominopelvic free fluid. Musculoskeletal: No acute osseous abnormality IMPRESSION: 1. Mild descending and sigmoid colonic wall thickening extending through the rectum, suggestive of a nonspecific infectious or inflammatory rectocolitis. 2. Mild wall thickening of an incompletely distended urinary bladder. Correlate with urinalysis to exclude cystitis. Electronically Signed   By: Dahlia Bailiff M.D.   On: 07/12/2022 13:15        Scheduled Meds:  folic acid  1 mg Oral Daily   methadone  67 mg Oral Daily   multivitamin with minerals  1 tablet Oral Daily   nicotine  21 mg Transdermal Daily   thiamine  100 mg Oral Daily   Or   thiamine  100 mg Intravenous Daily   Continuous Infusions:  sodium chloride Stopped (07/12/22 2308)   sodium chloride 100 mL/hr at 07/13/22 0907   cefTRIAXone (ROCEPHIN)  IV Stopped (07/12/22 2338)   metronidazole Stopped (07/13/22 0907)     LOS: 1 day        Hosie Poisson, MD [facility]   To contact the attending provider between 7A-7P or the covering provider during after hours 7P-7A, please  log into the web site [email or web address] and access using universal [place] password for that web site. If you do not have the password, please call the hospital operator.  07/13/2022, 2:48 PM

## 2022-07-13 NOTE — H&P (View-Only) (Signed)
[facility] Progress Note  Rita Carey 41 y.o. 10/19/1980   Subjective: Patient seen and examined laying in bed, patient is tearful and in increased pain, she is withdrawing from fentanyl, notes she uses it regularly.  She had 1 bowel movement today it is soft and well formed no bright red blood, no melena.    Objective: Vital signs in last 24 hours: Vitals:   07/13/22 0538 07/13/22 1029  BP: 96/63 (!) 140/90  Pulse: 82 (!) 101  Resp: 17 17  Temp: 97.8 F (36.6 C) 98.1 F (36.7 C)  SpO2:  100%    Physical Exam:  General:  Alert, cooperative, appears stated age, tearful, diaphoretic  Head:  Normocephalic, without obvious abnormality, atraumatic  Eyes:  Anicteric sclera, EOM's intact  Lungs:   Clear to auscultation bilaterally, respirations unlabored  Heart:  Regular rate and rhythm, S1, S2 normal  Abdomen:   Soft, LLQ tenderness, bowel sounds active all four quadrants,  no masses,   Extremities: Extremities normal, atraumatic, no  edema  Pulses: 2+ and symmetric    Lab Results: Recent Labs    07/12/22 1023 07/13/22 0907  NA 135 138  K 4.1 3.8  CL 99 103  CO2 30 26  GLUCOSE 107* 87  BUN 11 10  CREATININE 0.67 0.68  CALCIUM 9.0 8.8*   Recent Labs    07/12/22 1023 07/13/22 0907  AST 23 25  ALT 16 18  ALKPHOS 96 95  BILITOT 0.3 0.4  PROT 7.6 7.7  ALBUMIN 3.6 3.7   Recent Labs    07/12/22 1023 07/13/22 0406  WBC 7.5 6.3  NEUTROABS 5.6  --   HGB 13.2 12.6  HCT 40.4 37.0  MCV 90.6 87.5  PLT 203 196   No results for input(s): "LABPROT", "INR" in the last 72 hours.    Assessment Abdominal pain Nausea, vomiting Acute diarrhea Substance abuse   Patient presenting with 4 to 5 days of abdominal pain, nausea, vomiting, diarrhea.  No prior episodes with this constellation of symptoms.  Evaluation in the ED lipase was normal, no leukocytosis or anemia, CMP within normal limits.  No elevation of lactic acid.  Awaiting C. difficile and GI  pathogen panel.  CT scan with finding of descending, sigmoid, rectal colitis, possible due to infectious or inflammatory etiology.  No history of IBD.  Patient likely undergoing opioid withdrawal, she had previously been using fentanyl regularly.   CT AP 07/12/2022 Mild descending and sigmoid colonic wall thickening extending through the rectum, suggestive of a nonspecific infectious or inflammatory rectocolitis. 2. Mild wall thickening of an incompletely distended urinary bladder. Correlate with urinalysis to exclude cystitis   Plan: Follow results of GI pathogen panel and C. Difficile PCR.  Do not suspect C. difficile with patient's normal well formed solid bowel movement this morning. If no infectious etiology of diarrhea found consider colonoscopy tentatively Thursday. Start Nulytely prep, NPO midnight Continue supportive care Continue clear liquid diet [facility] will follow.   Ilian Wessell N Fajr Fife PA-C 07/13/2022, 11:51 AM  Contact #  [phone]  

## 2022-07-13 NOTE — Progress Notes (Signed)
[facility] Progress Note  Rita Carey 41 y.o. 01/26/1981   Subjective: Patient seen and examined laying in bed, patient is tearful and in increased pain, she is withdrawing from fentanyl, notes she uses it regularly.  She had 1 bowel movement today it is soft and well formed no bright red blood, no melena.    Objective: Vital signs in last 24 hours: Vitals:   07/13/22 0538 07/13/22 1029  BP: 96/63 (!) 140/90  Pulse: 82 (!) 101  Resp: 17 17  Temp: 97.8 F (36.6 C) 98.1 F (36.7 C)  SpO2:  100%    Physical Exam:  General:  Alert, cooperative, appears stated age, tearful, diaphoretic  Head:  Normocephalic, without obvious abnormality, atraumatic  Eyes:  Anicteric sclera, EOM's intact  Lungs:   Clear to auscultation bilaterally, respirations unlabored  Heart:  Regular rate and rhythm, S1, S2 normal  Abdomen:   Soft, LLQ tenderness, bowel sounds active all four quadrants,  no masses,   Extremities: Extremities normal, atraumatic, no  edema  Pulses: 2+ and symmetric    Lab Results: Recent Labs    07/12/22 1023 07/13/22 0907  NA 135 138  K 4.1 3.8  CL 99 103  CO2 30 26  GLUCOSE 107* 87  BUN 11 10  CREATININE 0.67 0.68  CALCIUM 9.0 8.8*   Recent Labs    07/12/22 1023 07/13/22 0907  AST 23 25  ALT 16 18  ALKPHOS 96 95  BILITOT 0.3 0.4  PROT 7.6 7.7  ALBUMIN 3.6 3.7   Recent Labs    07/12/22 1023 07/13/22 0406  WBC 7.5 6.3  NEUTROABS 5.6  --   HGB 13.2 12.6  HCT 40.4 37.0  MCV 90.6 87.5  PLT 203 196   No results for input(s): "LABPROT", "INR" in the last 72 hours.    Assessment Abdominal pain Nausea, vomiting Acute diarrhea Substance abuse   Patient presenting with 4 to 5 days of abdominal pain, nausea, vomiting, diarrhea.  No prior episodes with this constellation of symptoms.  Evaluation in the ED lipase was normal, no leukocytosis or anemia, CMP within normal limits.  No elevation of lactic acid.  Awaiting C. difficile and GI  pathogen panel.  CT scan with finding of descending, sigmoid, rectal colitis, possible due to infectious or inflammatory etiology.  No history of IBD.  Patient likely undergoing opioid withdrawal, she had previously been using fentanyl regularly.   CT AP 07/12/2022 Mild descending and sigmoid colonic wall thickening extending through the rectum, suggestive of a nonspecific infectious or inflammatory rectocolitis. 2. Mild wall thickening of an incompletely distended urinary bladder. Correlate with urinalysis to exclude cystitis   Plan: Follow results of GI pathogen panel and C. Difficile PCR.  Do not suspect C. difficile with patient's normal well formed solid bowel movement this morning. If no infectious etiology of diarrhea found consider colonoscopy tentatively Thursday. Start Nulytely prep, NPO midnight Continue supportive care Continue clear liquid diet [facility] will follow.   Arvella Nigh Michol Emory PA-C 07/13/2022, 11:51 AM  Contact #  [phone]

## 2022-07-14 ENCOUNTER — Inpatient Hospital Stay (HOSPITAL_COMMUNITY): Payer: Commercial Managed Care - HMO | Admitting: Anesthesiology

## 2022-07-14 ENCOUNTER — Encounter (HOSPITAL_COMMUNITY): Payer: Self-pay | Admitting: Internal Medicine

## 2022-07-14 ENCOUNTER — Encounter (HOSPITAL_COMMUNITY)
Admission: EM | Disposition: A | Discharge status: Home / Self Care | Payer: Self-pay | Source: Home / Self Care | Attending: Internal Medicine

## 2022-07-14 DIAGNOSIS — F319 Bipolar disorder, unspecified: Secondary | ICD-10-CM

## 2022-07-14 DIAGNOSIS — K626 Ulcer of anus and rectum: Secondary | ICD-10-CM

## 2022-07-14 DIAGNOSIS — K529 Noninfective gastroenteritis and colitis, unspecified: Secondary | ICD-10-CM | POA: Diagnosis not present

## 2022-07-14 DIAGNOSIS — F1320 Sedative, hypnotic or anxiolytic dependence, uncomplicated: Secondary | ICD-10-CM | POA: Diagnosis not present

## 2022-07-14 DIAGNOSIS — F17210 Nicotine dependence, cigarettes, uncomplicated: Secondary | ICD-10-CM | POA: Diagnosis not present

## 2022-07-14 DIAGNOSIS — R7989 Other specified abnormal findings of blood chemistry: Secondary | ICD-10-CM | POA: Diagnosis not present

## 2022-07-14 HISTORY — PX: BIOPSY: SHX5522

## 2022-07-14 HISTORY — PX: COLONOSCOPY: SHX5424

## 2022-07-14 SURGERY — COLONOSCOPY
Anesthesia: Monitor Anesthesia Care

## 2022-07-14 MED ORDER — PROPOFOL 1000 MG/100ML IV EMUL
INTRAVENOUS | Status: AC
  Filled 2022-07-14: qty 100

## 2022-07-14 MED ORDER — FOLIC ACID 1 MG PO TABS: 1.0000 mg | ORAL_TABLET | Freq: Every day | ORAL | 1 refills | Status: DC

## 2022-07-14 MED ORDER — VITAMIN B-1 100 MG PO TABS: 100.0000 mg | ORAL_TABLET | Freq: Every day | ORAL | 3 refills | Status: DC

## 2022-07-14 MED ORDER — NICOTINE 21 MG/24HR TD PT24: 21.0000 mg | MEDICATED_PATCH | Freq: Every day | TRANSDERMAL | 0 refills | Status: DC

## 2022-07-14 MED ORDER — ADULT MULTIVITAMIN W/MINERALS CH: 1.0000 | ORAL_TABLET | Freq: Every day | ORAL | Status: AC

## 2022-07-14 MED ORDER — LACTATED RINGERS IV SOLN: INTRAVENOUS | Status: DC

## 2022-07-14 MED ORDER — ALPRAZOLAM 0.5 MG PO TABS: 0.5000 mg | ORAL_TABLET | Freq: Three times a day (TID) | ORAL | 0 refills | Status: DC | PRN

## 2022-07-14 MED ORDER — SODIUM CHLORIDE 0.9 % IV SOLN: 2.0000 g | INTRAVENOUS | Status: DC

## 2022-07-14 MED ORDER — PROPOFOL 10 MG/ML IV BOLUS
INTRAVENOUS | Status: DC | PRN
  Administered 2022-07-14 (×6): 20 mg via INTRAVENOUS

## 2022-07-14 MED ORDER — PROPOFOL 500 MG/50ML IV EMUL
INTRAVENOUS | Status: DC | PRN
  Administered 2022-07-14: 130 ug/kg/min via INTRAVENOUS

## 2022-07-14 MED ORDER — ONDANSETRON HCL 4 MG/2ML IJ SOLN
INTRAMUSCULAR | Status: DC | PRN
  Administered 2022-07-14: 4 mg via INTRAVENOUS

## 2022-07-14 MED ORDER — PROPOFOL 500 MG/50ML IV EMUL
INTRAVENOUS | Status: AC
  Filled 2022-07-14: qty 50

## 2022-07-14 NOTE — Op Note (Signed)
[facility] Patient Name: Rita Carey Procedure Date: 07/14/2022 MRN: [id number] Attending MD: Kerin Salen , MD Date of Birth: Jan 23, 1981 CSN: [id number] Age: 41 Admit Type: Inpatient Procedure:                Colonoscopy Indications:              Generalized abdominal pain, Clinically significant                            diarrhea of unexplained origin, Abnormal CT of the                            GI tract, thickening of sigmoid and rectum. Providers:                Kerin Salen, MD, Blenda Mounts, RN, Irene Shipper,                            Technician Referring MD:             [facility] Medicines:                Monitored Anesthesia Care Complications:            No immediate complications. Estimated blood loss:                            Minimal. Estimated Blood Loss:     Estimated blood loss was minimal. Procedure:                Pre-Anesthesia Assessment:                           - Prior to the procedure, a History and Physical                            was performed, and patient medications and                            allergies were reviewed. The patient's tolerance of                            previous anesthesia was also reviewed. The risks                            and benefits of the procedure and the sedation                            options and risks were discussed with the patient.                            All questions were answered, and informed consent                            was obtained. Prior Anticoagulants: The patient has                            taken  no previous anticoagulant or antiplatelet                            agents. ASA Grade Assessment: III - A patient with                            severe systemic disease. After reviewing the risks                            and benefits, the patient was deemed in                            satisfactory condition to undergo the procedure.                           After  obtaining informed consent, the colonoscope                            was passed under direct vision. Throughout the                            procedure, the patient's blood pressure, pulse, and                            oxygen saturations were monitored continuously. The                            [facility] COLONOSCOPE [id number] was                            introduced through the anus and advanced to the the                            terminal ileum. The colonoscopy was performed                            without difficulty. The patient tolerated the                            procedure well. The quality of the bowel                            preparation was fair. Scope In: 1:09:01 PM Scope Out: 1:26:17 PM Scope Withdrawal Time: 0 hours 10 minutes 23 seconds  Total Procedure Duration: 0 hours 17 minutes 16 seconds  Findings:      The perianal and digital rectal examinations were normal.      The terminal ileum appeared normal.      The hepatic flexure, ascending colon, cecum and appendiceal orifice       appeared normal. Biopsies for histology were taken with a cold forceps       from the right colon for evaluation of microscopic colitis.      The transverse colon appeared normal.      The recto-sigmoid colon, sigmoid colon and descending colon appeared  normal. Biopsies for histology were taken with a cold forceps from the       left colon for evaluation of microscopic colitis.      A localized and patchy area of mildly ulcerated mucosa was found in the       rectum. Biopsies were taken with a cold forceps for histology.      The exam was otherwise without abnormality on direct and retroflexion       views. Impression:               - Preparation of the colon was fair.                           - The examined portion of the ileum was normal.                           - The hepatic flexure, ascending colon, cecum and                            appendiceal orifice are  normal. Biopsied.                           - The transverse colon is normal.                           - The recto-sigmoid colon, sigmoid colon and                            descending colon are normal. Biopsied.                           - Ulcerated mucosa in the rectum. Biopsied.                           - The examination was otherwise normal on direct                            and retroflexion views. Moderate Sedation:      Patient did not receive moderate sedation for this procedure, but       instead received monitored anesthesia care. Recommendation:           - Resume regular diet.                           - Continue present medications.                           - Await pathology results.                           - Repeat colonoscopy at age 44 for screening                            purposes. Procedure Code(s):        --- Professional ---  62836, Colonoscopy, flexible; with biopsy, single                            or multiple Diagnosis Code(s):        --- Professional ---                           K62.6, Ulcer of anus and rectum                           R10.84, Generalized abdominal pain                           R19.7, Diarrhea, unspecified                           R93.3, Abnormal findings on diagnostic imaging of                            other parts of digestive tract CPT copyright 2019 American [employer] Association. All rights reserved. The codes documented in this report are preliminary and upon coder review may  be revised to meet current compliance requirements. Kerin Salen, MD 07/14/2022 1:35:41 PM This report has been signed electronically. Number of Addenda: 0

## 2022-07-14 NOTE — Anesthesia Preprocedure Evaluation (Addendum)
Anesthesia Evaluation  Patient identified by MRN, date of birth, ID band Patient awake    Airway Mallampati: II       Dental   Pulmonary Current Smoker and Patient abstained from smoking.,    breath sounds clear to auscultation       Cardiovascular negative cardio ROS   Rhythm:Regular Rate:Normal     Neuro/Psych Seizures -,     GI/Hepatic Neg liver ROS, History noted Dr. Nyoka Cowden   Endo/Other  negative endocrine ROS  Renal/GU negative Renal ROS     Musculoskeletal   Abdominal   Peds  Hematology   Anesthesia Other Findings   Reproductive/Obstetrics                             Anesthesia Physical Anesthesia Plan  ASA: 3  Anesthesia Plan: MAC   Post-op Pain Management:    Induction: Intravenous  PONV Risk Score and Plan: 2 and Ondansetron and Midazolam  Airway Management Planned:   Additional Equipment:   Intra-op Plan:   Post-operative Plan:   Informed Consent: I have reviewed the patients History and Physical, chart, labs and discussed the procedure including the risks, benefits and alternatives for the proposed anesthesia with the patient or authorized representative who has indicated his/her understanding and acceptance.     Dental advisory given  Plan Discussed with: CRNA and Anesthesiologist  Anesthesia Plan Comments:         Anesthesia Quick Evaluation

## 2022-07-14 NOTE — Anesthesia Postprocedure Evaluation (Signed)
Anesthesia Post Note  Patient: Rita Carey  Procedure(s) Performed: COLONOSCOPY BIOPSY     Patient location during evaluation: Endoscopy Anesthesia Type: MAC Level of consciousness: awake Pain management: pain level controlled Vital Signs Assessment: post-procedure vital signs reviewed and stable Respiratory status: spontaneous breathing Cardiovascular status: stable Postop Assessment: no apparent nausea or vomiting Anesthetic complications: no   No notable events documented.  Last Vitals:  Vitals:   07/14/22 1350 07/14/22 1401  BP: (!) 132/91 123/84  Pulse: 73 69  Resp: (!) 22 16  Temp:  36.4 C  SpO2: 97% 100%    Last Pain:  Vitals:   07/14/22 1332  TempSrc: Temporal  PainSc: 9                  Brandee Markin

## 2022-07-14 NOTE — Transfer of Care (Signed)
Immediate Anesthesia Transfer of Care Note  Patient: Rita Carey  Procedure(s) Performed: COLONOSCOPY BIOPSY  Patient Location: PACU  Anesthesia Type:MAC  Level of Consciousness: sedated  Airway & Oxygen Therapy: Patient Spontanous Breathing and Patient connected to face mask oxygen  Post-op Assessment: Report given to RN and Post -op Vital signs reviewed and stable  Post vital signs: Reviewed and stable  Last Vitals:  Vitals Value Taken Time  BP    Temp    Pulse 82 07/14/22 1331  Resp 14 07/14/22 1331  SpO2 100 % 07/14/22 1331  Vitals shown include unvalidated device data.  Last Pain:  Vitals:   07/14/22 1158  TempSrc:   PainSc: 0-No pain      Patients Stated Pain Goal: 3 (72/09/47 0962)  Complications: No notable events documented.

## 2022-07-14 NOTE — Discharge Summary (Signed)
Physician Discharge Summary   Patient: Rita Carey MRN: [id number] DOB: 1981/06/13  Admit date:     07/12/2022  Discharge date: 07/14/2022  Discharge Physician: Hosie Poisson   PCP: Pcp, No   Recommendations at discharge:  Please follow up with PCp in one week.  Please follow up with resources for substance abuse.  Please follow up with psychiatry as recommended.  Please follow up with GI for the biopsy report.  Discharge Diagnoses: Principal Problem:   Colitis Active Problems:   Generalized anxiety disorder   Methadone use   Tobacco abuse   Elevated serum hCG    Hospital Course: COLA GANE is a 41 y.o. female with medical history significant of polysubstance abuse, anxiety. Presenting with abdominal pain. Her symptoms started 5 days ago. She had generalized cramps initially. It was followed by frequent episodes of diarrhea.   CT of the abdomen pelvis shows mild descending and sigmoid colon wall thickening extending through the rectum suggestive for nonspecific infectious or inflammatory rectal colitis  Assessment and Plan:  Rectal colitis Ruled out infectious process C. difficile PCR and GI PCR are negative.  GI on board , she underwent colonoscopy showing A localized and patchy area of mildly ulcerated mucosa was found in the rectum. Biopsies were taken with a cold forceps for histology Empirically started on on IV antibiotics, later on discontinued.  She was started on regular diet, able to tolerate without any issues.  She was discharged as her symptoms resolved, recommended to follow up with GI for the biopsy report.        Chronic methadone use Continue the same       Tobacco abuse Nicotine patch ordered       Anxiety Xanax added and prescriptions given.        Elevated serum hCG patient denies any recent sexual activity Repeat levels wnl.   Polysubstance abuse and panic attacks.  Ambulatory referral to Psychiatry given.  TOC consult for  resources.  PCP resources to be given by TOC.       Consultants: gi Procedures performed: COLONOSCOPY  Disposition: Home Diet recommendation:  Discharge Diet Orders (From admission, onward)     Start     Ordered   07/14/22 0000  Diet - low sodium heart healthy        07/14/22 1643           Regular diet DISCHARGE MEDICATION: Allergies as of 07/14/2022   No Known Allergies      Medication List     TAKE these medications    ALPRAZolam 0.5 MG tablet Commonly known as: Xanax Take 1 tablet (0.5 mg total) by mouth 3 (three) times daily as needed for sleep or anxiety.   Excedrin Migraine 250-250-65 MG tablet Generic drug: aspirin-acetaminophen-caffeine Take 1 tablet by mouth every 6 (six) hours as needed for headache or migraine.   folic acid 1 MG tablet Commonly known as: FOLVITE Take 1 tablet (1 mg total) by mouth daily.   methadone 10 MG/ML solution Commonly known as: DOLOPHINE Take 67 mg by mouth in the morning.   multivitamin with minerals Tabs tablet Take 1 tablet by mouth daily.   nicotine 21 mg/24hr patch Commonly known as: NICODERM CQ - dosed in mg/24 hours Place 1 patch (21 mg total) onto the skin daily. Start taking on: July 15, 2022   thiamine 100 MG tablet Commonly known as: Vitamin B-1 Take 1 tablet (100 mg total) by mouth daily. Start taking on: July 15, 2022        Discharge Exam: Filed Weights   07/13/22 0538 07/14/22 1158  Weight: 51.9 kg 51.9 kg   General exam: Appears calm and comfortable  Respiratory system: Clear to auscultation. Respiratory effort normal. Cardiovascular system: S1 & S2 heard, RRR. No JVD, murmurs, rubs, gallops or clicks. No pedal edema. Gastrointestinal system: Abdomen is nondistended, soft and nontender. No organomegaly or masses felt. Normal bowel sounds heard. Central nervous system: Alert and oriented. No focal neurological deficits. Extremities: Symmetric 5 x 5 power. Skin: No rashes, lesions or  ulcers Psychiatry: Judgement and insight appear normal. Mood & affect appropriate.    Condition at discharge: fair  The results of significant diagnostics from this hospitalization (including imaging, microbiology, ancillary and laboratory) are listed below for reference.   Imaging Studies: CT ABDOMEN PELVIS W CONTRAST  Result Date: 07/12/2022 CLINICAL DATA:  Left lower quadrant abdominal pain with diarrhea x5 days. EXAM: CT ABDOMEN AND PELVIS WITH CONTRAST TECHNIQUE: Multidetector CT imaging of the abdomen and pelvis was performed using the standard protocol following bolus administration of intravenous contrast. RADIATION DOSE REDUCTION: This exam was performed according to the departmental dose-optimization program which includes automated exposure control, adjustment of the mA and/or kV according to patient size and/or use of iterative reconstruction technique. CONTRAST:  OMNIPAQUE IOHEXOL 300 MG/ML  SOLN COMPARISON:  CT June 14, 2016 FINDINGS: Lower chest: Hypoventilatory change in the lung bases. Hepatobiliary: Subcentimeter hypodense lesion in the right lobe of the liver on image 22/2 is technically too small to accurately characterize but statistically likely to reflect a benign cyst or hemangioma. Gallbladder is nondistended. No biliary ductal dilation Pancreas: No pancreatic ductal dilation or evidence of acute inflammation Spleen: No splenomegaly. Adrenals/Urinary Tract: Bilateral adrenal glands appear normal. No hydronephrosis. Kidneys demonstrate symmetric enhancement. Mild wall thickening of an incompletely distended urinary bladder. Stomach/Bowel: No radiopaque enteric contrast material was administered. Stomach is minimally distended limiting evaluation. No pathologic dilation of small or large bowel. The appendix and terminal ileum appear normal. Mild descending and sigmoid colonic wall thickening extending through the rectum Vascular/Lymphatic: Normal caliber abdominal aorta.  No pathologically enlarged abdominal or pelvic lymph nodes. Reproductive: Uterus and bilateral adnexa are unremarkable. Other: No significant abdominopelvic free fluid. Musculoskeletal: No acute osseous abnormality IMPRESSION: 1. Mild descending and sigmoid colonic wall thickening extending through the rectum, suggestive of a nonspecific infectious or inflammatory rectocolitis. 2. Mild wall thickening of an incompletely distended urinary bladder. Correlate with urinalysis to exclude cystitis. Electronically Signed   By: Maudry Mayhew M.D.   On: 07/12/2022 13:15    Microbiology: Results for orders placed or performed during the hospital encounter of 07/12/22  Urine Culture     Status: None   Collection Time: 07/12/22 10:58 AM   Specimen: Urine, Clean Catch  Result Value Ref Range Status   Specimen Description   Final    URINE, CLEAN CATCH Performed at [facility], [address], [place], [place] [address]    Special Requests   Final    NONE Performed at [facility], [address], [place], [place] [address]    Culture   Final    NO GROWTH Performed at [facility], [address], [place], [place] [address]    Report Status 07/13/2022 FINAL  Final  Gastrointestinal Panel by PCR , Stool     Status: None   Collection Time: 07/13/22 11:00 AM   Specimen: Stool  Result Value Ref Range  Status   Campylobacter species NOT DETECTED NOT DETECTED Final   Plesimonas shigelloides NOT DETECTED NOT DETECTED Final   Salmonella species NOT DETECTED NOT DETECTED Final   Yersinia enterocolitica NOT DETECTED NOT DETECTED Final   Vibrio species NOT DETECTED NOT DETECTED Final   Vibrio cholerae NOT DETECTED NOT DETECTED Final   Enteroaggregative E coli (EAEC) NOT DETECTED NOT DETECTED Final   Enteropathogenic E coli (EPEC) NOT DETECTED NOT DETECTED Final   Enterotoxigenic E coli (ETEC) NOT DETECTED NOT DETECTED Final   Shiga like toxin producing E coli  (STEC) NOT DETECTED NOT DETECTED Final   Shigella/Enteroinvasive E coli (EIEC) NOT DETECTED NOT DETECTED Final   Cryptosporidium NOT DETECTED NOT DETECTED Final   Cyclospora cayetanensis NOT DETECTED NOT DETECTED Final   Entamoeba histolytica NOT DETECTED NOT DETECTED Final   Giardia lamblia NOT DETECTED NOT DETECTED Final   Adenovirus F40/41 NOT DETECTED NOT DETECTED Final   Astrovirus NOT DETECTED NOT DETECTED Final   Norovirus GI/GII NOT DETECTED NOT DETECTED Final   Rotavirus A NOT DETECTED NOT DETECTED Final   Sapovirus (I, II, IV, and V) NOT DETECTED NOT DETECTED Final    Comment: Performed at [facility], [address] [place], [place] [address]    Labs: CBC: Recent Labs  Lab 07/12/22 1023 07/13/22 0406  WBC 7.5 6.3  NEUTROABS 5.6  --   HGB 13.2 12.6  HCT 40.4 37.0  MCV 90.6 87.5  PLT 203 196   Basic Metabolic Panel: Recent Labs  Lab 07/12/22 1023 07/13/22 0907  NA 135 138  K 4.1 3.8  CL 99 103  CO2 30 26  GLUCOSE 107* 87  BUN 11 10  CREATININE 0.67 0.68  CALCIUM 9.0 8.8*   Liver Function Tests: Recent Labs  Lab 07/12/22 1023 07/13/22 0907  AST 23 25  ALT 16 18  ALKPHOS 96 95  BILITOT 0.3 0.4  PROT 7.6 7.7  ALBUMIN 3.6 3.7   CBG: No results for input(s): "GLUCAP" in the last 168 hours.  Discharge time spent: 42 MINUTES.   Signed: Kathlen Mody, MD [facility] 07/14/2022

## 2022-07-14 NOTE — Interval H&P Note (Signed)
History and Physical Interval Note: 41/female with abnormal CT and acute diarrhea for colonoscopy with propofol.  07/14/2022 12:35 PM  Rita Carey  has presented today for colonoscopy with the diagnosis of abdominal pain, abnormal CT.  The various methods of treatment have been discussed with the patient and family. After consideration of risks, benefits and other options for treatment, the patient has consented to  Procedure(s): COLONOSCOPY (N/A) as a surgical intervention.  The patient's history has been reviewed, patient examined, no change in status, stable for surgery.  I have reviewed the patient's chart and labs.  Questions were answered to the patient's satisfaction.     Ronnette Juniper

## 2022-07-15 LAB — SURGICAL PATHOLOGY

## 2022-07-18 ENCOUNTER — Encounter (HOSPITAL_COMMUNITY): Payer: Self-pay | Admitting: Gastroenterology

## 2022-07-19 ENCOUNTER — Encounter (HOSPITAL_COMMUNITY): Payer: Self-pay

## 2022-07-19 ENCOUNTER — Emergency Department (HOSPITAL_COMMUNITY)
Admission: EM | Admit: 2022-07-19 | Discharge: 2022-07-19 | Disposition: A | Discharge status: Home / Self Care | Payer: Commercial Managed Care - HMO | Attending: Emergency Medicine | Admitting: Emergency Medicine

## 2022-07-19 DIAGNOSIS — F410 Panic disorder [episodic paroxysmal anxiety] without agoraphobia: Secondary | ICD-10-CM | POA: Insufficient documentation

## 2022-07-19 DIAGNOSIS — L03115 Cellulitis of right lower limb: Secondary | ICD-10-CM | POA: Insufficient documentation

## 2022-07-19 MED ORDER — LORAZEPAM 1 MG PO TABS
2.0000 mg | ORAL_TABLET | Freq: Once | ORAL | Status: AC
  Administered 2022-07-19: 2 mg via ORAL
  Filled 2022-07-19: qty 2

## 2022-07-19 MED ORDER — ACETAMINOPHEN 500 MG PO TABS: 500.0000 mg | ORAL_TABLET | Freq: Four times a day (QID) | ORAL | 0 refills | Status: AC | PRN

## 2022-07-19 MED ORDER — HYDROXYZINE HCL 25 MG PO TABS: 25.0000 mg | ORAL_TABLET | Freq: Four times a day (QID) | ORAL | 0 refills | Status: AC

## 2022-07-19 MED ORDER — CEPHALEXIN 500 MG PO CAPS: 500.0000 mg | ORAL_CAPSULE | Freq: Two times a day (BID) | ORAL | 0 refills | Status: AC

## 2022-07-19 NOTE — Discharge Instructions (Addendum)
I have sent the following medications to your pharmacy  Keflex, this is the antibiotic for your cellulitis Hydroxyzine for your anxiety Tylenol for your pain.  You may use ibuprofen as well.  Please do your best to get sober.  Also follow-up with behavioral health hospital for any resources.  It was a pleasure to meet you and we really hope you feel better!

## 2022-07-19 NOTE — ED Provider Notes (Signed)
Rita Carey DEPT Provider Note   CSN: [id number] Arrival date & time: 07/19/22  1014     History  Chief Complaint  Patient presents with   Panic Attack    Rita Carey is a 41 y.o. female with a past medical history of opioid use disorder and panic disorder presenting today with a panic attack.  She reports that she was recently in the hospital and had an IV in her right upper extremity and someone told her it is getting infected and may spread to her heart.  This caused her to have a panic attack with full body shaking, crying and feeling overwhelmed.  Says she is still using fentanyl but no other drugs.  Additionally reports that she takes Klonopin for her anxiety however she dropped all of her pills at the home that she is living at home.  No SI/HI or AVH  HPI     Home Medications Prior to Admission medications   Medication Sig Start Date End Date Taking? Authorizing Provider  acetaminophen (TYLENOL) 500 MG tablet Take 1 tablet (500 mg total) by mouth every 6 (six) hours as needed. 07/19/22  Yes Zyanne Schumm A, PA-C  hydrOXYzine (ATARAX) 25 MG tablet Take 1 tablet (25 mg total) by mouth every 6 (six) hours for 7 days. 07/19/22 07/26/22 Yes Castin Donaghue A, PA-C  ALPRAZolam (XANAX) 0.5 MG tablet Take 1 tablet (0.5 mg total) by mouth 3 (three) times daily as needed for sleep or anxiety. 07/14/22   Hosie Poisson, MD  EXCEDRIN MIGRAINE [phone] MG tablet Take 1 tablet by mouth every 6 (six) hours as needed for headache or migraine.    [provider]  folic acid (FOLVITE) 1 MG tablet Take 1 tablet (1 mg total) by mouth daily. 07/14/22   Hosie Poisson, MD  methadone (DOLOPHINE) 10 MG/ML solution Take 67 mg by mouth in the morning.    [provider]  Multiple Vitamin (MULTIVITAMIN WITH MINERALS) TABS tablet Take 1 tablet by mouth daily. 07/14/22   Hosie Poisson, MD  nicotine (NICODERM CQ - DOSED IN MG/24 HOURS) 21 mg/24hr patch  Place 1 patch (21 mg total) onto the skin daily. 07/15/22   Hosie Poisson, MD  thiamine (VITAMIN B-1) 100 MG tablet Take 1 tablet (100 mg total) by mouth daily. 07/15/22   Hosie Poisson, MD      Allergies    Patient has no known allergies.    Review of Systems   Review of Systems  Physical Exam Updated Vital Signs BP (!) 124/93 (BP Location: Left Arm)   Pulse 99   Temp 98.1 F (36.7 C) (Oral)   Resp 18   LMP  (Within Days)   SpO2 100%  Physical Exam Vitals and nursing note reviewed.  Constitutional:      Appearance: Normal appearance.     Comments: Chronically ill-appearing   HENT:     Head: Normocephalic and atraumatic.  Eyes:     General: No scleral icterus.    Conjunctiva/sclera: Conjunctivae normal.  Pulmonary:     Effort: Pulmonary effort is normal. No respiratory distress.  Skin:    General: Skin is warm and dry.     Findings: No rash.     Comments: Multiple scabs to patient's bilateral upper and lower extremities.  1 to the anterior right thigh with purulent drainage and surrounding cellulitis  Neurological:     Mental Status: She is alert.  Psychiatric:        Mood and  Affect: Mood normal.     ED Results / Procedures / Treatments   Labs (all labs ordered are listed, but only abnormal results are displayed) Labs Reviewed - No data to display  EKG None  Radiology No results found.  Procedures Procedures   Medications Ordered in ED Medications  LORazepam (ATIVAN) tablet 2 mg (has no administration in time range)    ED Course/ Medical Decision Making/ A&P                           Medical Decision Making Risk OTC drugs. Prescription drug management.   41 year old female presenting today with a panic attack.  Elicited by someone telling her she may have a heart infx.  Physical exam shows multiple scabs with signs of cellulitis.  Likely secondary to her drug use.  Not tachycardic or febrile.  No increased work of breathing however she is teary on  physical exam.  MDM/disposition: Patient is on Xanax outpatient.  Per PDMP review she filled 30 pills 5 days ago.  This will not be refilled today.  I will send her hydroxyzine to her pharmacy that she reports sometimes helps her.  Additionally I will send Tylenol for her extremity discomfort.  Lastly, multiple of her scabs show signs of infection for cellulitis so I will send doxycycline for MRSA coverage.  She is agreeable to the above plan and will be discharged at this time  Final Clinical Impression(s) / ED Diagnoses Final diagnoses:  Panic attack    Rx / DC Orders ED Discharge Orders          Ordered    acetaminophen (TYLENOL) 500 MG tablet  Every 6 hours PRN        07/19/22 1058    hydrOXYzine (ATARAX) 25 MG tablet  Every 6 hours        07/19/22 1058    cephALEXin (KEFLEX) 500 MG capsule  2 times daily        07/19/22 1105           Results and diagnoses were explained to the patient. Return precautions discussed in full. Patient had no additional questions and expressed complete understanding.   This chart was dictated using voice recognition software.  Despite best efforts to proofread,  errors can occur which can change the documentation meaning.    Rita Benders, PA-C 07/19/22 1105    Rita Grandchild, MD 07/21/22 [phone]

## 2022-07-19 NOTE — ED Triage Notes (Signed)
Pt arrived via POV, c/o anxiety attack. States she is out of her meds currently as she is between providers.

## 2022-07-26 ENCOUNTER — Telehealth (HOSPITAL_COMMUNITY): Payer: Self-pay | Admitting: Licensed Clinical Social Worker

## 2022-07-26 NOTE — Telephone Encounter (Signed)
The therapist receives a return call from [place] confirming her identity via two identifiers. The therapist reached out to her as a referral was sent to the work que from [facility] in [place], [place].  Katonya says that she was going to [employer] in college but when she graduated her insurance was discontinued. She was paying out-of-pocket and was recently told that she owes Romelle Starcher [phone] so they can no longer see her. She now has [employer] but apparently cannot pay this outstanding balance.   Rita Carey says that she wants to see a psychiatrist for her panic attacks. She says that she was on a SSRI but is now taking Alprazolam and Hydroxyzine noting that she has been on Alprazolam for "just under 20 years." She admits that she does not have a prescription and gets the Alprazolam from her roommate; however, he goes to the bar and gets drunk and mad so will sometimes not give them to her. She denies any history of benzodiazepine withdrawal seizure.   The therapist notes that the referral paperwork indicates that Katori has issues with opioid dependence and after additional inquiry, she admits that she goes to [facility] for Methadone maintenance and is on 67 mg of Methadone. She says that as a result of her alprazolam that her Methadone dosage is limited and she has restrictions on her take homes. She says that she has a Social worker at [employer] with whom she meets. She also confirms that she has lots of Narcan in her home.   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 She says that she has been tried on a number of medications but Alprazolam is the only medication that controls her panic. She concludes that she wants to be back on the medication that was helping her to get through school which was "Adderall, Alprazolam, and Lexapro."   The therapist informs that if she were to  see a medication prescriber in this office that she would almost definitely not be prescribed a benzodiazepine explaining the reason that this class of medication is contraindicated with persons with opioid use disorders with [place] apparently already aware of this. He also talks her her about issues with rebound anxiety associated with benzodiazepines such that her panic attacks may in fact be withdrawal from alprazolam.   He asks if she is aware of what an [place] with [place] answering in the affirmative. The therapist suggests that having a roommate who supplies her with non-prescribed alprazolam and gets mad at her and is "not a nice person" does not sound conducive to recovery from addiction or panic attacks.  She notes that she believes that the landlord is about to put this roommate out in the near future and says that she has a dog which would not make it possible for her to go to an [employer].   The therapist will inform the Reception desk that Isidora is interested in seeing a medication prescriber for medication management only at this time; however, she may need to be referred to detox to get safely off alprazolam; however, at this point sounds more focused on continuing this medication. She says that if she were to be seen in this office that she would be able to pay her Specialist co-pay which could be $100.   [address] [place], LCSW, [facility], LCAS 07/26/2022

## 2022-07-26 NOTE — Telephone Encounter (Signed)
The therapist receives a return call from [place] and once again attempts to reach her by phone leaving another, HIPAA-compliant voicemail.  [address], [place], LCSW, [facility], LCAS 07/26/2022

## 2022-07-26 NOTE — Telephone Encounter (Signed)
The therapist attempts to reach this patient due to a referral via the [facility] for "chemical dependency."  The therapist leaves a HIPAA-compliant voicemail.  [address] [place], LCSW, [facility], LCAS 07/26/2022

## 2022-07-27 ENCOUNTER — Telehealth (HOSPITAL_COMMUNITY): Payer: Self-pay | Admitting: Licensed Clinical Social Worker

## 2022-07-27 NOTE — Telephone Encounter (Signed)
The therapist attempts to reach [place] after staffing her case with Mr. Darlyne Russian, P.A. who indicates that he cannot see her until she has been detoxed off benzodiazepines; and if she declines, he recommends referring her to a Suboxone program.  The therapist leaves a HIPAA-compliant voicemail for [place].  [address] [place], LCSW, [facility], LCAS 07/27/2022

## 2022-08-11 ENCOUNTER — Ambulatory Visit (HOSPITAL_COMMUNITY): Payer: No Typology Code available for payment source | Admitting: Psychiatry

## 2022-11-04 ENCOUNTER — Inpatient Hospital Stay (HOSPITAL_COMMUNITY)
Admission: EM | Admit: 2022-11-04 | Discharge: 2022-11-07 | DRG: 897 | Disposition: A | Discharge status: Home / Self Care | Payer: Commercial Managed Care - HMO | Attending: Internal Medicine | Admitting: Internal Medicine

## 2022-11-04 ENCOUNTER — Other Ambulatory Visit: Payer: Self-pay

## 2022-11-04 ENCOUNTER — Encounter (HOSPITAL_COMMUNITY): Payer: Self-pay

## 2022-11-04 DIAGNOSIS — Z79899 Other long term (current) drug therapy: Secondary | ICD-10-CM

## 2022-11-04 DIAGNOSIS — E876 Hypokalemia: Secondary | ICD-10-CM | POA: Diagnosis not present

## 2022-11-04 DIAGNOSIS — F1210 Cannabis abuse, uncomplicated: Secondary | ICD-10-CM | POA: Diagnosis present

## 2022-11-04 DIAGNOSIS — F10931 Alcohol use, unspecified with withdrawal delirium: Secondary | ICD-10-CM | POA: Diagnosis present

## 2022-11-04 DIAGNOSIS — E860 Dehydration: Secondary | ICD-10-CM | POA: Diagnosis present

## 2022-11-04 DIAGNOSIS — F1011 Alcohol abuse, in remission: Secondary | ICD-10-CM | POA: Diagnosis present

## 2022-11-04 DIAGNOSIS — F1910 Other psychoactive substance abuse, uncomplicated: Secondary | ICD-10-CM | POA: Diagnosis present

## 2022-11-04 DIAGNOSIS — F17210 Nicotine dependence, cigarettes, uncomplicated: Secondary | ICD-10-CM | POA: Diagnosis present

## 2022-11-04 DIAGNOSIS — F13939 Sedative, hypnotic or anxiolytic use, unspecified with withdrawal, unspecified: Secondary | ICD-10-CM | POA: Diagnosis present

## 2022-11-04 DIAGNOSIS — Z720 Tobacco use: Secondary | ICD-10-CM | POA: Diagnosis present

## 2022-11-04 DIAGNOSIS — F10932 Alcohol use, unspecified with withdrawal with perceptual disturbance: Secondary | ICD-10-CM

## 2022-11-04 DIAGNOSIS — F13239 Sedative, hypnotic or anxiolytic dependence with withdrawal, unspecified: Principal | ICD-10-CM | POA: Diagnosis present

## 2022-11-04 DIAGNOSIS — F1920 Other psychoactive substance dependence, uncomplicated: Secondary | ICD-10-CM | POA: Diagnosis present

## 2022-11-04 DIAGNOSIS — F1120 Opioid dependence, uncomplicated: Secondary | ICD-10-CM | POA: Diagnosis present

## 2022-11-04 DIAGNOSIS — F319 Bipolar disorder, unspecified: Secondary | ICD-10-CM | POA: Diagnosis present

## 2022-11-04 DIAGNOSIS — F410 Panic disorder [episodic paroxysmal anxiety] without agoraphobia: Secondary | ICD-10-CM | POA: Diagnosis present

## 2022-11-04 DIAGNOSIS — F1320 Sedative, hypnotic or anxiolytic dependence, uncomplicated: Secondary | ICD-10-CM | POA: Diagnosis present

## 2022-11-04 DIAGNOSIS — F10229 Alcohol dependence with intoxication, unspecified: Secondary | ICD-10-CM | POA: Diagnosis present

## 2022-11-04 DIAGNOSIS — F10231 Alcohol dependence with withdrawal delirium: Secondary | ICD-10-CM | POA: Diagnosis present

## 2022-11-04 DIAGNOSIS — F10939 Alcohol use, unspecified with withdrawal, unspecified: Secondary | ICD-10-CM | POA: Diagnosis present

## 2022-11-04 DIAGNOSIS — F1110 Opioid abuse, uncomplicated: Secondary | ICD-10-CM | POA: Diagnosis present

## 2022-11-04 DIAGNOSIS — F1990 Other psychoactive substance use, unspecified, uncomplicated: Secondary | ICD-10-CM | POA: Diagnosis present

## 2022-11-04 DIAGNOSIS — F1190 Opioid use, unspecified, uncomplicated: Secondary | ICD-10-CM | POA: Diagnosis present

## 2022-11-04 LAB — COMPREHENSIVE METABOLIC PANEL
ALT: 39 U/L (ref 0–44)
AST: 40 U/L (ref 15–41)
Albumin: 4 g/dL (ref 3.5–5.0)
Alkaline Phosphatase: 156 U/L — ABNORMAL HIGH (ref 38–126)
Anion gap: 10 (ref 5–15)
BUN: 22 mg/dL — ABNORMAL HIGH (ref 6–20)
CO2: 26 mmol/L (ref 22–32)
Calcium: 9 mg/dL (ref 8.9–10.3)
Chloride: 99 mmol/L (ref 98–111)
Creatinine, Ser: 0.57 mg/dL (ref 0.44–1.00)
GFR, Estimated: >60 mL/min (ref >60)
Glucose, Bld: 165 mg/dL — ABNORMAL HIGH (ref 70–99)
Potassium: 3.5 mmol/L (ref 3.5–5.1)
Sodium: 135 mmol/L (ref 135–145)
Total Bilirubin: 0.5 mg/dL (ref 0.3–1.2)
Total Protein: 8.1 g/dL (ref 6.5–8.1)

## 2022-11-04 LAB — CBC WITH DIFFERENTIAL/PLATELET
Abs Immature Granulocytes: 0.04 10*3/uL (ref 0.00–0.07)
Basophils Absolute: 0 10*3/uL (ref 0.0–0.1)
Basophils Relative: 0 %
Eosinophils Absolute: 0 10*3/uL (ref 0.0–0.5)
Eosinophils Relative: 0 %
HCT: 38.6 % (ref 36.0–46.0)
Hemoglobin: 12.8 g/dL (ref 12.0–15.0)
Immature Granulocytes: 0 %
Lymphocytes Relative: 14 %
Lymphs Abs: 1.5 10*3/uL (ref 0.7–4.0)
MCH: 28.8 pg (ref 26.0–34.0)
MCHC: 33.2 g/dL (ref 30.0–36.0)
MCV: 86.7 fL (ref 80.0–100.0)
Monocytes Absolute: 0.7 10*3/uL (ref 0.1–1.0)
Monocytes Relative: 6 %
Neutro Abs: 8.4 10*3/uL — ABNORMAL HIGH (ref 1.7–7.7)
Neutrophils Relative %: 80 %
Platelets: 437 10*3/uL — ABNORMAL HIGH (ref 150–400)
RBC: 4.45 MIL/uL (ref 3.87–5.11)
RDW: 12.7 % (ref 11.5–15.5)
WBC: 10.7 10*3/uL — ABNORMAL HIGH (ref 4.0–10.5)
nRBC: 0 % (ref 0.0–0.2)

## 2022-11-04 LAB — I-STAT BETA HCG BLOOD, ED (MC, WL, AP ONLY): I-stat hCG, quantitative: <5 m[IU]/mL (ref <5)

## 2022-11-04 MED ORDER — THIAMINE HCL 100 MG/ML IJ SOLN
100.0000 mg | Freq: Every day | INTRAMUSCULAR | Status: DC
  Administered 2022-11-05: 100 mg via INTRAVENOUS
  Filled 2022-11-04: qty 2

## 2022-11-04 MED ORDER — LORAZEPAM 1 MG PO TABS: 0.0000 mg | ORAL_TABLET | Freq: Two times a day (BID) | ORAL | Status: DC

## 2022-11-04 MED ORDER — ADULT MULTIVITAMIN W/MINERALS CH
1.0000 | ORAL_TABLET | Freq: Once | ORAL | Status: AC
  Administered 2022-11-04: 1 via ORAL
  Filled 2022-11-04: qty 1

## 2022-11-04 MED ORDER — THIAMINE MONONITRATE 100 MG PO TABS
100.0000 mg | ORAL_TABLET | Freq: Every day | ORAL | Status: DC
  Administered 2022-11-06 – 2022-11-07 (×2): 100 mg via ORAL
  Filled 2022-11-04 (×2): qty 1

## 2022-11-04 MED ORDER — FOLIC ACID 1 MG PO TABS
1.0000 mg | ORAL_TABLET | Freq: Once | ORAL | Status: AC
  Administered 2022-11-04: 1 mg via ORAL
  Filled 2022-11-04: qty 1

## 2022-11-04 MED ORDER — LORAZEPAM 1 MG PO TABS
0.0000 mg | ORAL_TABLET | Freq: Four times a day (QID) | ORAL | Status: DC
  Administered 2022-11-04: 3 mg via ORAL
  Administered 2022-11-05: 2 mg via ORAL
  Filled 2022-11-04: qty 2
  Filled 2022-11-04: qty 3

## 2022-11-04 NOTE — Consult Note (Cosign Needed)
[place] [place] ED ASSESSMENT   Reason for Consult:  Psych Consult Referring Physician:  Genevive Bi, PA-C Patient Identification: Rita Carey MRN:  [id number] ED Chief Complaint: Polysubstance abuse [facility])  Diagnosis:  Principal Problem:   Polysubstance abuse ([place]) Active Problems:   Panic disorder   History of ETOH abuse   ED Assessment Time Calculation: Start Time: 1840 Stop Time: 1920 Total Time in Minutes (Assessment Completion): 40   HPI:  Patient reports she is detoxing from ETOH, fentanyl and benzos, has been clean for about 1 week. Has been trying to manage this at home, but is has been too hard. Reports she is just done with the drugs and ETOH.  1 bottle of wine per day  $80 fentanyl per day  1 mg xanax per day    Subjective:  Rita Carey, 42 y.o., female patient seen face to face by this provider, consulted with Dr. Dwyane Dee; and chart reviewed on 11/04/22.  On evaluation Rita Carey reports that she has not eaten in 7 days and that she has not been able to sleep.  Patient states that she is 42 years old she has been using fentanyl Xanax, sometimes methadone and drinking alcohol, patient states "I do not think I have another round of this left in me ".  Patient states she lives in a house with others lives in a shared room, denies SI/HI/AVH.  She states that once in a while she does see black spots or black dots but not harmful.  Patient states that she has panic disorder, and anxiety and she has a history of seizures.  Stated that she came here because she told her best friend she wanted to get help and he dropped her off she does see a psychiatrist off and on but has not been leaving.  Patient states the last time she was in rehab was in 2012 she liked it and it was a good experience, she also had a 6-year period of sobriety but after her wife passed away due to overdosing on fentanyl she started using drugs and alcohol again.  During evaluation Rita Carey is sitting in  the waiting room in an area all by herself laying down in no acute distress.  She is alert and oriented x 3 cooperative, she is shaky, disheveled and frail looking.  Her mood is sad with flat affect.  Her speech is shaky at times, as if a stutter, she has appropriate behavior but tearful. Objectively there is no evidence of psychosis/mania or delusional thinking. Patient is able to converse coherently, no distractibility, or pre-occupation. She denies suicidal/self-harm/homicidal ideation, psychosis, and paranoia.  Patient says she stopped using this morning, she tried to taper herself off of her drugs and alcohol, she says she really wants help because she cannot keep doing this to her body.  Patient is tearful, sad, and disheveled looking.  No UDS given at this time.   Past Psychiatric History: Patient states she has never been admitted to a psychiatric hospital, but she has been diagnosed by a psychiatrist with panic disorder and anxiety.  Risk to Self or Others: Is the patient at risk to self? Patient denies Has the patient been a risk to self in the past 6 months? Patient denies Has the patient been a risk to self within the distant past? Patient denies Is the patient a risk to others? Patient denies Has the patient been a risk to others in the past 6 months? Patient denies Has the  patient been a risk to others within the distant past? Patient denies  [place] Scale:  [place] ED from 11/04/2022 in [facility] Emergency Department at [facility] ED from 07/19/2022 in [facility] Emergency Department at [facility] ED to Hosp-Admission (Discharged) from 07/12/2022 in [place] No Risk No Risk No Risk       AIMS:  , , ,  ,   ASAM:    Substance Abuse:     Past Medical History:  Past Medical History:  Diagnosis Date   Anxiety    Anxiety    Depression    Insomnia    Polysubstance abuse ([place])    Seizures ([place])     Past  Surgical History:  Procedure Laterality Date   BIOPSY  07/14/2022   Procedure: BIOPSY;  Surgeon: Ronnette Juniper, MD;  Location: [facility];  Service: Gastroenterology;;   COLONOSCOPY N/A 07/14/2022   Procedure: COLONOSCOPY;  Surgeon: Ronnette Juniper, MD;  Location: [facility];  Service: Gastroenterology;  Laterality: N/A;   NO PAST SURGERIES     SHOULDER SURGERY     WISDOM TOOTH EXTRACTION     Family History: No family history on file.   Social History:  Social History   Substance and Sexual Activity  Alcohol Use Yes   Alcohol/week: 21.0 standard drinks of alcohol   Types: 21 Standard drinks or equivalent per week   Comment: denies     Social History   Substance and Sexual Activity  Drug Use Yes   Types: Marijuana, Oxycodone, Heroin   Comment: denies    Social History   Socioeconomic History   Marital status: Single    Spouse name: Not on file   Number of children: Not on file   Years of education: Not on file   Highest education level: Not on file  Occupational History   Not on file  Tobacco Use   Smoking status: Every Day    Packs/day: 1.00    Years: 11.00    Total pack years: 11.00    Types: Cigarettes   Smokeless tobacco: Never  Substance and Sexual Activity   Alcohol use: Yes    Alcohol/week: 21.0 standard drinks of alcohol    Types: 21 Standard drinks or equivalent per week    Comment: denies   Drug use: Yes    Types: Marijuana, Oxycodone, Heroin    Comment: denies   Sexual activity: Not on file  Other Topics Concern   Not on file  Social History Narrative   Not on file   Social Determinants of Health   Financial Resource Strain: Not on file  Food Insecurity: No Food Insecurity (07/13/2022)   Hunger Vital Sign    Worried About Running Out of Food in the Last Year: Never true    Ran Out of Food in the Last Year: Never true  Transportation Needs: No Transportation Needs (07/13/2022)   PRAPARE - Hydrologist (Medical): No     Lack of Transportation (Non-Medical): No  Physical Activity: Not on file  Stress: Not on file  Social Connections: Not on file    Allergies:  No Known Allergies  Labs: No results found for this or any previous visit (from the past 48 hour(s)).  Current Facility-Administered Medications  Medication Dose Route Frequency Provider Last Rate Last Admin   folic acid (FOLVITE) tablet 1 mg  1 mg Oral Once Azucena Cecil, PA-C  multivitamin with minerals tablet 1 tablet  1 tablet Oral Once Al Decant, PA-C       Current Outpatient Medications  Medication Sig Dispense Refill   acetaminophen (TYLENOL) 500 MG tablet Take 1 tablet (500 mg total) by mouth every 6 (six) hours as needed. 30 tablet 0   ALPRAZolam (XANAX) 0.5 MG tablet Take 1 tablet (0.5 mg total) by mouth 3 (three) times daily as needed for sleep or anxiety. 30 tablet 0   EXCEDRIN MIGRAINE 250-250-65 MG tablet Take 1 tablet by mouth every 6 (six) hours as needed for headache or migraine.     folic acid (FOLVITE) 1 MG tablet Take 1 tablet (1 mg total) by mouth daily. 30 tablet 1   methadone (DOLOPHINE) 10 MG/ML solution Take 67 mg by mouth in the morning.     Multiple Vitamin (MULTIVITAMIN WITH MINERALS) TABS tablet Take 1 tablet by mouth daily.     nicotine (NICODERM CQ - DOSED IN MG/24 HOURS) 21 mg/24hr patch Place 1 patch (21 mg total) onto the skin daily. 28 patch 0   thiamine (VITAMIN B-1) 100 MG tablet Take 1 tablet (100 mg total) by mouth daily. 30 tablet 3    Musculoskeletal: Strength & Muscle Tone: increased Gait & Station: unsteady Patient leans: N/A   Psychiatric Specialty Exam: Presentation  General Appearance:  Disheveled  Eye Contact: Fleeting  Speech: Pressured; Slurred  Speech Volume: Decreased  Handedness:No data recorded  Mood and Affect  Mood: Anxious  Affect: Depressed; Tearful   Thought Process  Thought Processes: Coherent  Descriptions of  Associations:Intact  Orientation:Full (Time, Place and Person)  Thought Content:WDL  History of Schizophrenia/Schizoaffective disorder:No data recorded Duration of Psychotic Symptoms:No data recorded Hallucinations:Hallucinations: Visual Description of Visual Hallucinations: little black spots or shapes  Ideas of Reference:None  Suicidal Thoughts:Suicidal Thoughts: No  Homicidal Thoughts:Homicidal Thoughts: No   Sensorium  Memory: Immediate Fair; Recent Fair  Judgment: Fair  Insight: Fair   Art therapist  Concentration: Fair  Attention Span: Fair  Recall: Fair  Fund of Knowledge: Good  Language: Good   Psychomotor Activity  Psychomotor Activity: Psychomotor Activity: Restlessness   Assets  Assets: Communication Skills; Social Support; Housing    Sleep  Sleep: Sleep: Fair   Physical Exam: Physical Exam Vitals and nursing note reviewed.  Eyes:     Pupils: Pupils are equal, round, and reactive to light.  Cardiovascular:     Rate and Rhythm: Normal rate.  Pulmonary:     Effort: Pulmonary effort is normal.  Musculoskeletal:        General: Normal range of motion.  Neurological:     Mental Status: She is alert.  Psychiatric:        Attention and Perception: Attention normal.        Mood and Affect: Mood is anxious and depressed. Affect is tearful.        Speech: Speech is slurred.        Behavior: Behavior is cooperative.        Thought Content: Thought content normal.        Cognition and Memory: Cognition is impaired.        Judgment: Judgment is inappropriate.    Review of Systems  Constitutional: Negative.   HENT: Negative.    Respiratory: Negative.    Musculoskeletal: Negative.   Psychiatric/Behavioral:  Positive for substance abuse.    Blood pressure (!) 121/91, pulse 90, temperature 97.7 F (36.5 C), temperature source Oral, resp. rate 16, SpO2 99 %.  There is no height or weight on file to calculate BMI.  Medical  Decision Making: Recommend detox treatment when patient is medically cleared. Patient denies SI/HI/AVH, wants help with substance and ETOH use. Will consult with SW on bed availability.    Disposition: Recommend psychiatric Inpatient admission when medically cleared.  Alona Bene, PMHNP 11/04/2022 7:37 PM

## 2022-11-04 NOTE — ED Provider Triage Note (Signed)
Emergency Medicine Provider Triage Evaluation Note  Rita Carey , a 42 y.o. female  was evaluated in triage.  Pt complains of requesting detox.  Patient reports that she has been binging on alcohol, fentanyl and Xanax for unknown period of time.  Patient reports that she has been without alcohol for the last 5 to 7 days, without fentanyl for the last 5 days, without Xanax for the last 24 hours.  Patient reports that she last ingested 1 mg of Xanax yesterday.  Patient states that she typically drinks 1 bottle of wine per day, does $80 worth of fentanyl per day, 1 mg Xanax per day.  Patient reports that she has been hospitalized in the past due to withdrawal.  Patient reports that she has been having nausea, vomiting, diarrhea and abdominal pain for the last 5 days.  Patient reports that nausea and vomiting resolved 3 days ago.  Patient here stating that she is worried about withdrawing.  Patient denies any fevers, chest pain or shortness of breath.  Patient denies any SI, HI, AVH.  Review of Systems  Positive:  Negative:   Physical Exam  BP (!) 121/91 (BP Location: Right Arm)   Pulse 90   Temp 97.7 F (36.5 C) (Oral)   Resp 16   SpO2 99%  Gen:   Awake, no distress   Resp:  Normal effort  MSK:   Moves extremities without difficulty  Other:    Medical Decision Making  Medically screening exam initiated at 2:39 PM.  Appropriate orders placed.  MANHATTAN MCCUEN was informed that the remainder of the evaluation will be completed by another provider, this initial triage assessment does not replace that evaluation, and the importance of remaining in the ED until their evaluation is complete.     Azucena Cecil, PA-C 11/04/22 1440

## 2022-11-04 NOTE — ED Notes (Signed)
Pt. Requesting ultrasound for lab draws.

## 2022-11-04 NOTE — ED Triage Notes (Signed)
Patient reports she is detoxing from ETOH, fentanyl and benzos, has been clean for about 1 week. Has been trying to manage this at home, but is has been too hard. Reports she is just done with the drugs and ETOH.   1 bottle of wine per day  $80 fentanyl per day  1 mg xanax per day

## 2022-11-04 NOTE — ED Provider Notes (Signed)
[facility] Provider Note   CSN: [id number] Arrival date & time: 11/04/22  1348     History  Chief Complaint  Patient presents with   Dehydration   detox    Rita Carey is a 42 y.o. female with bipolar disorder, alcohol dependence, history of IVDU, tobacco use, polysubstance abuse, who presents with dehydration, detox.  Patient reports concern for dehydration and detoxing from fentanyl IV and alcohol. States she drinks one bottle of red wine per day and her last drink was approximately 5 days ago. Using $80 of fentanyl per day as well, IV. She states she also takes 1 mg of xanax for anxiety. She has a history of polysubstance abuse and was sober for a long time until her wife died in 02-07-16 and she has relapsed since then. She has gone through inpatient treatment before and that was resulted in her long-term sobriety before and she had a good experience with that.  She denies any SI or HI.  Denies AH VH.  She states she has had racing thoughts and has not slept in approximately 10 days.  She states she was nauseous but does not feel nauseous anymore.  She does feel anxious and like her skin is intermittently burning.  Patient states that she tried to transition herself from IV fentanyl to Suboxone at home but felt like she was going into withdrawal and was unsuccessful.  She states she does not want to try any more Suboxone here today.  Patient states she has had seizures as a complication of withdrawal in the past but does not think she has had any this week.  HPI     Home Medications Prior to Admission medications   Medication Sig Start Date End Date Taking? Authorizing Provider  acetaminophen (TYLENOL) 500 MG tablet Take 1 tablet (500 mg total) by mouth every 6 (six) hours as needed. 07/19/22   Redwine, Madison A, PA-C  ALPRAZolam (XANAX) 0.5 MG tablet Take 1 tablet (0.5 mg total) by mouth 3 (three) times daily as needed for sleep or  anxiety. 07/14/22   Hosie Poisson, MD  EXCEDRIN MIGRAINE [phone] MG tablet Take 1 tablet by mouth every 6 (six) hours as needed for headache or migraine.    [provider]  folic acid (FOLVITE) 1 MG tablet Take 1 tablet (1 mg total) by mouth daily. 07/14/22   Hosie Poisson, MD  methadone (DOLOPHINE) 10 MG/ML solution Take 67 mg by mouth in the morning.    [provider]  Multiple Vitamin (MULTIVITAMIN WITH MINERALS) TABS tablet Take 1 tablet by mouth daily. 07/14/22   Hosie Poisson, MD  nicotine (NICODERM CQ - DOSED IN MG/24 HOURS) 21 mg/24hr patch Place 1 patch (21 mg total) onto the skin daily. 07/15/22   Hosie Poisson, MD  thiamine (VITAMIN B-1) 100 MG tablet Take 1 tablet (100 mg total) by mouth daily. 07/15/22   Hosie Poisson, MD      Allergies    Patient has no known allergies.    Review of Systems   Review of Systems Review of systems Negative for f/c, CP, SOB, cough, urinary symptoms.  A 10 point review of systems was performed and is negative unless otherwise reported in HPI.  Physical Exam Updated Vital Signs BP (!) 121/91 (BP Location: Right Arm)   Pulse 90   Temp 97.7 F (36.5 C) (Oral)   Resp 16   Ht 5\' 4"  (1.626 m)   Wt 51 kg  SpO2 99%   BMI 19.30 kg/m  Physical Exam General: Thin-appearing mildly disheveled female, lying in bed.  HEENT: PERRLA, EOMI Sclera anicteric, MMM, trachea midline. +Tongue fasciculations Cardiology: RRR, no murmurs/rubs/gallops. BL radial and DP pulses equal bilaterally.  Resp: Normal respiratory rate and effort. CTAB, no wheezes, rhonchi, crackles.  Abd: Soft, non-tender, non-distended. No rebound tenderness or guarding.  GU: Deferred. MSK: No peripheral edema or signs of trauma. Extremities without deformity or TTP. No cyanosis or clubbing. Skin: warm, dry. No rashes or lesions. Back: No CVA tenderness Neuro: A&Ox4, CNs II-XII grossly intact. MAEs. Sensation grossly intact.  Psych: Normal mood and affect.   ED  Results / Procedures / Treatments   Labs (all labs ordered are listed, but only abnormal results are displayed) Labs Reviewed  CBC WITH DIFFERENTIAL/PLATELET - Abnormal; Notable for the following components:      Result Value   WBC 10.7 (*)    Platelets 437 (*)    Neutro Abs 8.4 (*)    All other components within normal limits  COMPREHENSIVE METABOLIC PANEL - Abnormal; Notable for the following components:   Glucose, Bld 165 (*)    BUN 22 (*)    Alkaline Phosphatase 156 (*)    All other components within normal limits  URINALYSIS, ROUTINE W REFLEX MICROSCOPIC  ETHANOL  RAPID URINE DRUG SCREEN, HOSP PERFORMED  I-STAT BETA HCG BLOOD, ED ([facility], [facility], [facility] ONLY)    EKG EKG Interpretation  Date/Time:  Friday November 04 2022 20:35:01 EST Ventricular Rate:  56 PR Interval:  114 QRS Duration: 88 QT Interval:  430 QTC Calculation: 414 R Axis:   69 Text Interpretation: Sinus bradycardia Minimal voltage criteria for LVH, may be normal variant ( Sokolow-Lyon ) Borderline ECG When compared with ECG of 12-Jul-2022 10:11, PREVIOUS ECG IS PRESENT Confirmed by Cindee Lame [phone]) on 11/04/2022 11:03:12 PM  Radiology No results found.  Procedures Procedures    Medications Ordered in ED Medications  LORazepam (ATIVAN) tablet 0-4 mg (3 mg Oral Given 11/04/22 2342)  LORazepam (ATIVAN) tablet 0-4 mg (has no administration in time range)  thiamine (VITAMIN B1) tablet 100 mg (has no administration in time range)    Or  thiamine (VITAMIN B1) injection 100 mg (has no administration in time range)  folic acid (FOLVITE) tablet 1 mg (1 mg Oral Given 11/04/22 2309)  multivitamin with minerals tablet 1 tablet (1 tablet Oral Given 11/04/22 2309)    ED Course/ Medical Decision Making/ A&P                          Medical Decision Making Amount and/or Complexity of Data Reviewed Labs:  Decision-making details documented in ED Course.  Risk OTC drugs. Prescription drug management.    This  patient presents to the ED for concern of polysubstance withdrawal; this involves an extensive number of treatment options, and is a complaint that carries with it a high risk of complications and morbidity.  I considered the following differential and admission for this acute, potentially life threatening condition.   MDM:     Consider possible polysubstance withdrawal for patient.  Seems as though she has EtOH abuse, benzo abuse, and opiate abuse.  She could be experiencing any or all of these withdrawal syndromes.  She does have tremors and tongue fasciculations to indicate alcohol withdrawal though she is not significantly tachycardic or hypertensive.  CIWA is approximately 14-16, will treat with p.o. Ativan and reevaluate.  Patient states that  she does not want any Suboxone currently for opiate withdrawal.  Will attempt to treat with Ativan and if symptoms are not adequately controlled can try addition of clonidine p.o. as well for possible opiate withdrawal contribution.  No known seizure activity from withdrawal.  Patient is A&O x 4 and is not in alcoholic hallucinosis.  Patient reports decreased p.o. intake consider electrolyte abnormalities, renal injury, dehydration and will obtain CMP.  Will obtain urine to evaluate for UDS.  Patient states she has not slept in several days, and she does have a history of bipolar disorder.  This could be manifestation of possible mania.  She states she does not had any AH or VH and she is mildly anxious and tremulous on exam but not tangential or pressured, lower concern for acute psychosis or substance-induced psychotic disorder.  She denies any SI or HI.  Will initially treat patient for withdrawal with Ativan and reevaluate.  Urine is pending.   Patient was also already consulted to TTS.  She told them that she stopped using drugs this morning rather than last week.  Psych is recommending inpatient detox treatment when patient is medically cleared.  TTS planning  to talk with social work on bed availability.   Clinical Course as of 11/04/22 2351  Fri Nov 04, 2022  2302 WBC(!): 10.7 [HN]  2302 Hemoglobin: 12.8 [HN]  2302 I-stat hCG, quantitative: <5.0 [HN]  2344 Comprehensive metabolic panel(!) Mildly elevated glucose, BUN; otherwise unremarkable. No significant lyt ederangements or renal injury [HN]    Clinical Course User Index [HN] Loetta Rough, MD    Labs: I Ordered, and personally interpreted labs.  The pertinent results include:  those listed above  Additional history obtained from chart review  Social Determinants of Health: Patient lives independently   Disposition:  Patient is signed out to the oncoming ED physician who is made aware of her history, presentation, exam, workup, and plan. Plan is to reevaluate after ativan. If withdrawal symptoms are adequately controlled, can medically clear patient with librium taper  Co morbidities that complicate the patient evaluation  Past Medical History:  Diagnosis Date   Anxiety    Anxiety    Depression    Insomnia    Polysubstance abuse (HCC)    Seizures (HCC)      Medicines Meds ordered this encounter  Medications   folic acid (FOLVITE) tablet 1 mg   multivitamin with minerals tablet 1 tablet   LORazepam (ATIVAN) tablet 0-4 mg    Order Specific Question:   CIWA-AR < 5 =    Answer:   0 mg    Order Specific Question:   CIWA-AR 5 -10 =    Answer:   1 mg    Order Specific Question:   CIWA-AR 11 -15 =    Answer:   2 mg    Order Specific Question:   CIWA-AR 16 -20 =    Answer:   3 mg    Order Specific Question:   CIWA-AR 16 -20 =    Answer:   Recheck CIWA-AR in 1 hour; if > 20 notify MD    Order Specific Question:   CIWA-AR > 20 =    Answer:   4 mg    Order Specific Question:   CIWA-AR > 20 =    Answer:   Notify MD   LORazepam (ATIVAN) tablet 0-4 mg    Order Specific Question:   CIWA-AR < 5 =    Answer:   0  mg    Order Specific Question:   CIWA-AR 5 -10 =    Answer:    1 mg    Order Specific Question:   CIWA-AR 11 -15 =    Answer:   2 mg    Order Specific Question:   CIWA-AR 16 -20 =    Answer:   3 mg    Order Specific Question:   CIWA-AR 16 -20 =    Answer:   Recheck CIWA-AR in 1 hour; if > 20 notify MD    Order Specific Question:   CIWA-AR > 20 =    Answer:   4 mg    Order Specific Question:   CIWA-AR > 20 =    Answer:   Notify MD   OR Linked Order Group    thiamine (VITAMIN B1) tablet 100 mg    thiamine (VITAMIN B1) injection 100 mg    I have reviewed the patients home medicines and have made adjustments as needed  Problem List / ED Course: Problem List Items Addressed This Visit       Other   * (Principal) Polysubstance abuse (HCC)   Other Visit Diagnoses     Alcohol withdrawal syndrome with perceptual disturbance (HCC)    -  Primary                   This note was created using dictation software, which may contain spelling or grammatical errors.    Loetta Rough, MD 11/04/22 [phone]

## 2022-11-05 DIAGNOSIS — Z720 Tobacco use: Secondary | ICD-10-CM

## 2022-11-05 DIAGNOSIS — F10932 Alcohol use, unspecified with withdrawal with perceptual disturbance: Secondary | ICD-10-CM

## 2022-11-05 DIAGNOSIS — F10931 Alcohol use, unspecified with withdrawal delirium: Secondary | ICD-10-CM | POA: Diagnosis present

## 2022-11-05 DIAGNOSIS — F10939 Alcohol use, unspecified with withdrawal, unspecified: Secondary | ICD-10-CM | POA: Diagnosis present

## 2022-11-05 DIAGNOSIS — F1910 Other psychoactive substance abuse, uncomplicated: Secondary | ICD-10-CM

## 2022-11-05 DIAGNOSIS — F13939 Sedative, hypnotic or anxiolytic use, unspecified with withdrawal, unspecified: Secondary | ICD-10-CM | POA: Diagnosis present

## 2022-11-05 LAB — RAPID URINE DRUG SCREEN, HOSP PERFORMED
Amphetamines: NOT DETECTED
Barbiturates: NOT DETECTED
Benzodiazepines: POSITIVE — AB
Cocaine: NOT DETECTED
Opiates: NOT DETECTED
Tetrahydrocannabinol: POSITIVE — AB

## 2022-11-05 LAB — URINALYSIS, ROUTINE W REFLEX MICROSCOPIC
Bilirubin Urine: NEGATIVE
Glucose, UA: NEGATIVE mg/dL
Hgb urine dipstick: NEGATIVE
Ketones, ur: 20 mg/dL — AB
Leukocytes,Ua: NEGATIVE
Nitrite: NEGATIVE
Protein, ur: NEGATIVE mg/dL
Specific Gravity, Urine: 1.033 — ABNORMAL HIGH (ref 1.005–1.030)
pH: 6 (ref 5.0–8.0)

## 2022-11-05 LAB — CBC
HCT: 37.9 % (ref 36.0–46.0)
Hemoglobin: 12.6 g/dL (ref 12.0–15.0)
MCH: 28.8 pg (ref 26.0–34.0)
MCHC: 33.2 g/dL (ref 30.0–36.0)
MCV: 86.7 fL (ref 80.0–100.0)
Platelets: 379 K/uL (ref 150–400)
RBC: 4.37 MIL/uL (ref 3.87–5.11)
RDW: 12.7 % (ref 11.5–15.5)
WBC: 11.7 K/uL — ABNORMAL HIGH (ref 4.0–10.5)
nRBC: 0 % (ref 0.0–0.2)

## 2022-11-05 LAB — ETHANOL: Alcohol, Ethyl (B): <10 mg/dL (ref <10)

## 2022-11-05 LAB — CREATININE, SERUM
Creatinine, Ser: 0.66 mg/dL (ref 0.44–1.00)
GFR, Estimated: >60 mL/min (ref >60)

## 2022-11-05 MED ORDER — ONDANSETRON HCL 4 MG PO TABS: 4.0000 mg | ORAL_TABLET | Freq: Four times a day (QID) | ORAL | Status: DC | PRN

## 2022-11-05 MED ORDER — SENNOSIDES-DOCUSATE SODIUM 8.6-50 MG PO TABS: 1.0000 | ORAL_TABLET | Freq: Every evening | ORAL | Status: DC | PRN

## 2022-11-05 MED ORDER — SODIUM CHLORIDE 0.9 % IV SOLN
260.0000 mg | Freq: Once | INTRAVENOUS | Status: AC
  Administered 2022-11-05: 260 mg via INTRAVENOUS
  Filled 2022-11-05: qty 2

## 2022-11-05 MED ORDER — PHENOBARBITAL 32.4 MG PO TABS: 64.8000 mg | ORAL_TABLET | Freq: Three times a day (TID) | ORAL | Status: DC

## 2022-11-05 MED ORDER — ENOXAPARIN SODIUM 40 MG/0.4ML IJ SOSY
40.0000 mg | PREFILLED_SYRINGE | Freq: Every day | INTRAMUSCULAR | Status: DC
  Administered 2022-11-05 – 2022-11-07 (×3): 40 mg via SUBCUTANEOUS
  Filled 2022-11-05 (×3): qty 0.4

## 2022-11-05 MED ORDER — LORAZEPAM 2 MG/ML IJ SOLN
1.0000 mg | Freq: Once | INTRAMUSCULAR | Status: AC
  Administered 2022-11-05: 1 mg via INTRAVENOUS
  Filled 2022-11-05: qty 1

## 2022-11-05 MED ORDER — ONDANSETRON HCL 4 MG/2ML IJ SOLN: 4.0000 mg | Freq: Four times a day (QID) | INTRAMUSCULAR | Status: DC | PRN

## 2022-11-05 MED ORDER — PHENOBARBITAL 32.4 MG PO TABS
97.2000 mg | ORAL_TABLET | Freq: Three times a day (TID) | ORAL | Status: AC
  Administered 2022-11-05 – 2022-11-07 (×6): 97.2 mg via ORAL
  Filled 2022-11-05: qty 1
  Filled 2022-11-05: qty 3
  Filled 2022-11-05: qty 1
  Filled 2022-11-05 (×4): qty 3

## 2022-11-05 MED ORDER — SODIUM CHLORIDE 0.9 % IV SOLN: INTRAVENOUS | Status: DC

## 2022-11-05 MED ORDER — PHENOBARBITAL 32.4 MG PO TABS: 32.4000 mg | ORAL_TABLET | Freq: Three times a day (TID) | ORAL | Status: DC

## 2022-11-05 MED ORDER — LORAZEPAM 2 MG/ML IJ SOLN
2.0000 mg | Freq: Once | INTRAMUSCULAR | Status: AC
  Administered 2022-11-05: 2 mg via INTRAVENOUS
  Filled 2022-11-05: qty 1

## 2022-11-05 MED ORDER — NICOTINE 21 MG/24HR TD PT24: 21.0000 mg | MEDICATED_PATCH | Freq: Once | TRANSDERMAL | Status: DC

## 2022-11-05 MED ORDER — LORAZEPAM 2 MG/ML IJ SOLN
1.0000 mg | INTRAMUSCULAR | Status: DC | PRN
  Administered 2022-11-05 – 2022-11-06 (×2): 1 mg via INTRAVENOUS
  Filled 2022-11-05 (×2): qty 1

## 2022-11-05 NOTE — Progress Notes (Signed)
Brief same day  note:  Patient is a 42 year old female with history of alcohol abuse, tobacco abuse, IV fentanyl abuse, marijuana abuse who was brought to the emergency department with withdrawal symptoms.  Takes fentanyl from the streets, injects.  Also drinks a bottle of wine a day.  Takes Xanax.  She was seen by psychiatry in the emergency department.  As per the report, she was unable to sleep, anxiety.  Her friend brought her to the emergency department for help.  She was found to have sad mood with flat affect but no suicidal ideation.  Psychiatry seen her in the emergency department on 1/27 and recommended detox treatment.  There was concern for severe alcohol withdrawal, benzodiazepine withdrawal with CIWA score greater than 20 and we were requested for admission.  Assessment and plan:  Alcohol withdrawal/benzodiazepine withdrawal: Started on CIWA protocol.  Also on phenobarbital  withdrawal protocol. Continue multivitamins, IV fluids Will consult psychiatry.  They recommended detoxification. She is still intermittently confused, tremulous but overall oriented  Fentanyl use/marijuana use: Counseled for cessation.  Patient is motivated to get enrolled into detoxification program.  Tobacco use: Smokes half pack a day.  Continue nicotine patch  History of bipolar disorder

## 2022-11-05 NOTE — H&P (Addendum)
PCP:   Pcp, No   Chief Complaint:  Alcohol withdrawal with delirium  HPI: [name], fentanyl abuse and benzo abuse.  Patient presented to the ER and withdrawal.  Since being in the ER she is gone from talking conversationally to complete delirium and shakes despite being on IV Ativan.  Per patient to nursing early she uses approximately $80 worth of fentanyl daily as well as at least a bottle of wine daily as well as daily p.o. benzos.  Review of Systems:  Unable to obtain due to patient's delirium  Past Medical History: Past Medical History:  Diagnosis Date   Anxiety    Anxiety    Depression    Insomnia    Polysubstance abuse ([place])    Seizures ([place])    Past Surgical History:  Procedure Laterality Date   BIOPSY  07/14/2022   Procedure: BIOPSY;  Surgeon: Ronnette Juniper, MD;  Location: [facility];  Service: Gastroenterology;;   COLONOSCOPY N/A 07/14/2022   Procedure: COLONOSCOPY;  Surgeon: Ronnette Juniper, MD;  Location: [facility];  Service: Gastroenterology;  Laterality: N/A;   NO PAST SURGERIES     SHOULDER SURGERY     WISDOM TOOTH EXTRACTION      Medications: Prior to Admission medications   Medication Sig Start Date End Date Taking? Authorizing Provider  acetaminophen (TYLENOL) 500 MG tablet Take 1 tablet (500 mg total) by mouth every 6 (six) hours as needed. 07/19/22   Redwine, Madison A, PA-C  ALPRAZolam (XANAX) 0.5 MG tablet Take 1 tablet (0.5 mg total) by mouth 3 (three) times daily as needed for sleep or anxiety. 07/14/22   Hosie Poisson, MD  EXCEDRIN MIGRAINE [phone] MG tablet Take 1 tablet by mouth every 6 (six) hours as needed for headache or migraine.    [provider]  folic acid (FOLVITE) 1 MG tablet Take 1 tablet (1 mg total) by mouth daily. 07/14/22   Hosie Poisson, MD  methadone (DOLOPHINE) 10 MG/ML solution Take 67 mg by mouth in the morning.    [provider]  Multiple Vitamin (MULTIVITAMIN  WITH MINERALS) TABS tablet Take 1 tablet by mouth daily. 07/14/22   Hosie Poisson, MD  nicotine (NICODERM CQ - DOSED IN MG/24 HOURS) 21 mg/24hr patch Place 1 patch (21 mg total) onto the skin daily. 07/15/22   Hosie Poisson, MD  thiamine (VITAMIN B-1) 100 MG tablet Take 1 tablet (100 mg total) by mouth daily. 07/15/22   Hosie Poisson, MD    Allergies:  No Known Allergies  Social History:  reports that she has been smoking cigarettes. She has a 11.00 pack-year smoking history. She has never used smokeless tobacco. She reports current alcohol use of about 21.0 standard drinks of alcohol per week. She reports current drug use. Drugs: Marijuana, Oxycodone, and Heroin.  Family History: History reviewed. No pertinent family history.  Physical Exam: Vitals:   11/05/22 0450 11/05/22 0451 11/05/22 0530 11/05/22 0544  BP: 106/62 104/75 107/80 107/80  Pulse:  97 64 64  Resp:      Temp:      TempSrc:      SpO2:   98%   Weight:      Height:        General: Awake but disoriented, well developed and nourished, no acute distress Eyes: PERRLA, pink conjunctiva, no scleral icterus ENT: Moist oral mucosa, neck supple, no thyromegaly Lungs: clear to ascultation, no wheeze, no crackles, no use of accessory muscles  Cardiovascular: regular rate and rhythm, no regurgitation, no gallops, no murmurs. No carotid bruits, no JVD Abdomen: soft, positive BS, non-tender, non-distended, no organomegaly, not an acute abdomen GU: not examined Neuro: CN II - XII grossly intact, Musculoskeletal: strength 5/5 all extremities, no clubbing, cyanosis or edema Skin: no rash, no subcutaneous crepitation, no decubitus Psych: Delirious patient   Labs on Admission:  Recent Labs    11/04/22 2231  NA 135  K 3.5  CL 99  CO2 26  GLUCOSE 165*  BUN 22*  CREATININE 0.57  CALCIUM 9.0   Recent Labs    11/04/22 2231  AST 40  ALT 39  ALKPHOS 156*  BILITOT 0.5  PROT 8.1  ALBUMIN 4.0    Recent Labs     11/04/22 2231  WBC 10.7*  NEUTROABS 8.4*  HGB 12.8  HCT 38.6  MCV 86.7  PLT 437*    Radiological Exams on Admission: No results found.  Assessment/Plan Present on Admission:  Alcohol withdrawal delirium ([place]) -Admit to stepdown -CIWA protocol in place.  Pharmacy contacted, they will place patient on a phenobarbital alcohol withdrawal protocol and adjust as needed benzos accordingly. -Patient may not be responding to the benzos as she does benzos daily on outpatient basis. -Folic acid and vitamin B 1 ordered IV -Patient just evaluated, she now understands somewhat but is still delirious. -PCCM was contacted, patient was declined   Bipolar 1 disorder ([place]) -Home meds on hold   Polysubstance abuse ([place])  Benzodiazepine dependence ([place])  IVDU (intravenous drug user)  Heroin abuse ([place]) -On phenobarbital and benzos   Tobacco abuse -Nicotine patch ordered   Rita Carey 11/05/2022, 6:57 AM

## 2022-11-05 NOTE — Progress Notes (Signed)
[facility] Progress Note  11/05/2022 3:13 PM Rita Carey  MRN:  [id number]   Principal Problem: Polysubstance abuse Rita Carey) Diagnosis:  Principal Problem:   Polysubstance abuse ([place]) Active Problems:   Panic disorder   Bipolar 1 disorder (HCC)   Alcohol dependence with intoxication with complication ([place])   Benzodiazepine dependence ([place])   Heroin abuse ([place])   IVDU (intravenous drug user)   History of ETOH abuse   Methadone use   Tobacco abuse   Alcohol withdrawal ([place])   Alcohol withdrawal delirium ([place])   Benzodiazepine withdrawal ([place])   ED Assessment Time Calculation: Start Time: 1100 Stop Time: 1115 Total Time in Minutes (Assessment Completion): 15   Subjective:  Rita [facility], 42 y.o., female patient seen face to face by this provider, consulted with Dr. Dwyane Dee; and chart reviewed on 11/05/22.  On evaluation Rita Carey reports she is doing okay, she states that her appetite and sleep are fair. During evaluation Rita Carey is laying in the Carey gurney, patient is sweaty and shaky.  Patient is able to remember who provider was from the previous night, she is alert and oriented x 3, and cooperative, patient appears to be distracted by noises in her room and by the nurse given her medication.  Patient's friend Rita [facility] is in the room, provider ask patient if is okay to talk with him in room, and she said "yes". Patient mood is anxious with a flat affect.  Patient voice is shaky as she is talking, and volume is low she is appropriate behavior but distracted at times.  Patient is intermittently confused and having some delusional/delirium thinking, the nurses give her her medication and her IV, and patient attempts to take the flush syringe, she states that she is having some hallucinations and apologized to the nurse. She denies suicidal/self-harm/homicidal ideation, and paranoia.   Patient is aware that she is being admitted, and becomes tearful stating "I  do not know if I have another in me, I am getting too old for this ".  Patient is still in agreement to get help with detox after her inpatient admission.   Past Psychiatric History: Patient states she has never been admitted to a psychiatric [facility], but she has been diagnosed by a psychiatrist with panic disorder and anxiety.   [place] Scale:  [place] [facility] from 11/04/2022 in [facility] at [facility] from 07/19/2022 in [facility] at [facility] to Hosp-Admission (Discharged) from 07/12/2022 in [place] No Risk No Risk No Risk       Past Medical History:  Past Medical History:  Diagnosis Date   Anxiety    Anxiety    Depression    Insomnia    Polysubstance abuse ([place])    Seizures ([place])     Past Surgical History:  Procedure Laterality Date   BIOPSY  07/14/2022   Procedure: BIOPSY;  Surgeon: Ronnette Juniper, MD;  Location: [facility];  Service: Gastroenterology;;   COLONOSCOPY N/A 07/14/2022   Procedure: COLONOSCOPY;  Surgeon: Ronnette Juniper, MD;  Location: [facility];  Service: Gastroenterology;  Laterality: N/A;   NO PAST SURGERIES     SHOULDER SURGERY     WISDOM TOOTH EXTRACTION     Family History: History reviewed. No pertinent family history.   Social History:  Social History   Substance and Sexual Activity  Alcohol Use Yes   Alcohol/week: 21.0 standard drinks  of alcohol   Types: 21 Standard drinks or equivalent per week   Comment: denies     Social History   Substance and Sexual Activity  Drug Use Yes   Types: Marijuana, Oxycodone, Heroin   Comment: denies    Social History   Socioeconomic History   Marital status: Single    Spouse name: Not on file   Number of children: Not on file   Years of education: Not on file   Highest education level: Not on file  Occupational History   Not on file  Tobacco Use   Smoking status: Every Day     Packs/day: 1.00    Years: 11.00    Total pack years: 11.00    Types: Cigarettes   Smokeless tobacco: Never  Substance and Sexual Activity   Alcohol use: Yes    Alcohol/week: 21.0 standard drinks of alcohol    Types: 21 Standard drinks or equivalent per week    Comment: denies   Drug use: Yes    Types: Marijuana, Oxycodone, Heroin    Comment: denies   Sexual activity: Not on file  Other Topics Concern   Not on file  Social History Narrative   Not on file   Social Determinants of Health   Financial Resource Strain: Not on file  Food Insecurity: No Food Insecurity (07/13/2022)   Hunger Vital Sign    Worried About Running Out of Food in the Last Year: Never true    Ran Out of Food in the Last Year: Never true  Transportation Needs: No Transportation Needs (07/13/2022)   PRAPARE - Hydrologist (Medical): No    Lack of Transportation (Non-Medical): No  Physical Activity: Not on file  Stress: Not on file  Social Connections: Not on file    Sleep: Fair  Appetite:  Poor  Current Medications: Current Facility-Administered Medications  Medication Dose Route Frequency Provider Last Rate Last Admin   0.9 %  sodium chloride infusion   Intravenous Continuous Shelly Coss, MD 100 mL/hr at 11/05/22 1332 New Bag at 11/05/22 1332   enoxaparin (LOVENOX) injection 40 mg  40 mg Subcutaneous Daily Crosley, Debby, MD   40 mg at 11/05/22 1017   LORazepam (ATIVAN) injection 1 mg  1 mg Intravenous Q4H PRN Crosley, Debby, MD       nicotine (NICODERM CQ - dosed in mg/24 hours) patch 21 mg  21 mg Transdermal Once Kommor, [place], MD       ondansetron (ZOFRAN) tablet 4 mg  4 mg Oral Q6H PRN Crosley, Debby, MD       Or   ondansetron (ZOFRAN) injection 4 mg  4 mg Intravenous Q6H PRN Crosley, Debby, MD       PHENobarbital (LUMINAL) tablet 97.2 mg  97.2 mg Oral Q8H Crosley, Debby, MD       Followed by   Derrill Memo ON 11/07/2022] PHENobarbital (LUMINAL) tablet 64.8 mg  64.8 mg  Oral Q8H Crosley, Debby, MD       Followed by   Derrill Memo ON 11/09/2022] PHENobarbital (LUMINAL) tablet 32.4 mg  32.4 mg Oral Q8H Crosley, Debby, MD       senna-docusate (Senokot-S) tablet 1 tablet  1 tablet Oral QHS PRN Crosley, Debby, MD       thiamine (VITAMIN B1) tablet 100 mg  100 mg Oral Daily Audley Hose, MD       Or   thiamine (VITAMIN B1) injection 100 mg  100 mg Intravenous Daily Cindee Lame  N, MD   100 mg at 11/05/22 1015   Current Outpatient Medications  Medication Sig Dispense Refill   acetaminophen (TYLENOL) 500 MG tablet Take 1 tablet (500 mg total) by mouth every 6 (six) hours as needed. 30 tablet 0   ALPRAZolam (XANAX) 0.5 MG tablet Take 1 tablet (0.5 mg total) by mouth 3 (three) times daily as needed for sleep or anxiety. 30 tablet 0   EXCEDRIN MIGRAINE 250-250-65 MG tablet Take 1 tablet by mouth every 6 (six) hours as needed for headache or migraine.     folic acid (FOLVITE) 1 MG tablet Take 1 tablet (1 mg total) by mouth daily. 30 tablet 1   methadone (DOLOPHINE) 10 MG/ML solution Take 67 mg by mouth in the morning.     Multiple Vitamin (MULTIVITAMIN WITH MINERALS) TABS tablet Take 1 tablet by mouth daily.     nicotine (NICODERM CQ - DOSED IN MG/24 HOURS) 21 mg/24hr patch Place 1 patch (21 mg total) onto the skin daily. 28 patch 0   thiamine (VITAMIN B-1) 100 MG tablet Take 1 tablet (100 mg total) by mouth daily. 30 tablet 3    Lab Results:  Results for orders placed or performed during the [facility] encounter of 11/04/22 (from the past 48 hour(s))  CBC with Differential     Status: Abnormal   Collection Time: 11/04/22 10:31 PM  Result Value Ref Range   WBC 10.7 (H) 4.0 - 10.5 K/uL   RBC 4.45 3.87 - 5.11 MIL/uL   Hemoglobin 12.8 12.0 - 15.0 g/dL   HCT 16.1 09.6 - 04.5 %   MCV 86.7 80.0 - 100.0 fL   MCH 28.8 26.0 - 34.0 pg   MCHC 33.2 30.0 - 36.0 g/dL   RDW 40.9 81.1 - 91.4 %   Platelets 437 (H) 150 - 400 K/uL   nRBC 0.0 0.0 - 0.2 %   Neutrophils Relative % 80 %    Neutro Abs 8.4 (H) 1.7 - 7.7 K/uL   Lymphocytes Relative 14 %   Lymphs Abs 1.5 0.7 - 4.0 K/uL   Monocytes Relative 6 %   Monocytes Absolute 0.7 0.1 - 1.0 K/uL   Eosinophils Relative 0 %   Eosinophils Absolute 0.0 0.0 - 0.5 K/uL   Basophils Relative 0 %   Basophils Absolute 0.0 0.0 - 0.1 K/uL   Immature Granulocytes 0 %   Abs Immature Granulocytes 0.04 0.00 - 0.07 K/uL    Comment: Performed at [facility], [address] [place], [place] [address]  Comprehensive metabolic panel     Status: Abnormal   Collection Time: 11/04/22 10:31 PM  Result Value Ref Range   Sodium 135 135 - 145 mmol/L   Potassium 3.5 3.5 - 5.1 mmol/L   Chloride 99 98 - 111 mmol/L   CO2 26 22 - 32 mmol/L   Glucose, Bld 165 (H) 70 - 99 mg/dL    Comment: Glucose reference range applies only to samples taken after fasting for at least 8 hours.   BUN 22 (H) 6 - 20 mg/dL   Creatinine, Ser 6.21 0.44 - 1.00 mg/dL   Calcium 9.0 8.9 - 30.8 mg/dL   Total Protein 8.1 6.5 - 8.1 g/dL   Albumin 4.0 3.5 - 5.0 g/dL   AST 40 15 - 41 U/L   ALT 39 0 - 44 U/L   Alkaline Phosphatase 156 (H) 38 - 126 U/L   Total Bilirubin 0.5 0.3 - 1.2 mg/dL   GFR, Estimated >65 >78  mL/min    Comment: (NOTE) Calculated using the CKD-EPI Creatinine Equation (2021)    Anion gap 10 5 - 15    Comment: Performed at [facility], [place] [address], [place], [place] [address]  Ethanol     Status: None   Collection Time: 11/04/22 10:31 PM  Result Value Ref Range   Alcohol, Ethyl (B) <10 <10 mg/dL    Comment: (NOTE) Lowest detectable limit for serum alcohol is 10 mg/dL.  For medical purposes only. Performed at [facility], [place] [address], [place], [place] [address]   I-Stat beta hCG blood, ED     Status: None   Collection Time: 11/04/22 10:36 PM  Result Value Ref Range   I-stat hCG, quantitative <5.0 <5 mIU/mL   Comment 3            Comment:   GEST. AGE      CONC.  (mIU/mL)   <=1 WEEK         5 - 50     2 WEEKS       50 - 500     3 WEEKS       100 - 10,000     4 WEEKS     1,000 - 30,000        FEMALE AND NON-PREGNANT FEMALE:     LESS THAN 5 mIU/mL   Urinalysis, Routine w reflex microscopic -Urine, Clean Catch     Status: Abnormal   Collection Time: 11/05/22 12:35 AM  Result Value Ref Range   Color, Urine YELLOW YELLOW   APPearance HAZY (A) CLEAR   Specific Gravity, Urine 1.033 (H) 1.005 - 1.030   pH 6.0 5.0 - 8.0   Glucose, UA NEGATIVE NEGATIVE mg/dL   Hgb urine dipstick NEGATIVE NEGATIVE   Bilirubin Urine NEGATIVE NEGATIVE   Ketones, ur 20 (A) NEGATIVE mg/dL   Protein, ur NEGATIVE NEGATIVE mg/dL   Nitrite NEGATIVE NEGATIVE   Leukocytes,Ua NEGATIVE NEGATIVE    Comment: Performed at [facility] [address], [place], [place] [address]  Rapid urine drug screen ([facility] performed)     Status: Abnormal   Collection Time: 11/05/22 12:35 AM  Result Value Ref Range   Opiates NONE DETECTED NONE DETECTED   Cocaine NONE DETECTED NONE DETECTED   Benzodiazepines POSITIVE (A) NONE DETECTED   Amphetamines NONE DETECTED NONE DETECTED   Tetrahydrocannabinol POSITIVE (A) NONE DETECTED   Barbiturates NONE DETECTED NONE DETECTED    Comment: (NOTE) DRUG SCREEN FOR MEDICAL PURPOSES ONLY.  IF CONFIRMATION IS NEEDED FOR ANY PURPOSE, NOTIFY LAB WITHIN 5 DAYS.  LOWEST DETECTABLE LIMITS FOR URINE DRUG SCREEN Drug Class                     Cutoff (ng/mL) Amphetamine and metabolites    1000 Barbiturate and metabolites    200 Benzodiazepine                 200 Opiates and metabolites        300 Cocaine and metabolites        300 THC                            50 Performed at [facility], [place] [address], [place], [place] [address]   CBC     Status: Abnormal   Collection Time: 11/05/22 10:45 AM  Result Value Ref Range   WBC 11.7 (  H) 4.0 - 10.5 K/uL   RBC 4.37 3.87 - 5.11 MIL/uL   Hemoglobin 12.6 12.0 - 15.0 g/dL   HCT 37.9 36.0 -  46.0 %   MCV 86.7 80.0 - 100.0 fL   MCH 28.8 26.0 - 34.0 pg   MCHC 33.2 30.0 - 36.0 g/dL   RDW 12.7 11.5 - 15.5 %   Platelets 379 150 - 400 K/uL   nRBC 0.0 0.0 - 0.2 %    Comment: Performed at [facility], [place] [address], [place], [place] [address]  Creatinine, serum     Status: None   Collection Time: 11/05/22 10:45 AM  Result Value Ref Range   Creatinine, Ser 0.66 0.44 - 1.00 mg/dL   GFR, Estimated >60 >60 mL/min    Comment: (NOTE) Calculated using the CKD-EPI Creatinine Equation (2021) Performed at [facility], [place] [address], [place], [place] [address]     Blood Alcohol level:  Lab Results  Component Value Date   ETH <10 11/04/2022   ETH 55 (H) 04/30/2022    Physical Findings:  CIWA:  CIWA-Ar Total: 15 COWS:     Musculoskeletal: Strength & Muscle Tone: weak/ decreased Gait & Station: unsteady Patient leans: N/A  Psychiatric Specialty Exam:  Presentation  General Appearance:  Disheveled  Eye Contact: Fair  Speech: Pressured; Slurred  Speech Volume: Decreased  Handedness:No data recorded  Mood and Affect  Mood: Anxious  Affect: Flat   Thought Process  Thought Processes: Disorganized  Descriptions of Associations:Intact  Orientation:Partial  Thought Content:WDL  History of Schizophrenia/Schizoaffective disorder:No data recorded Duration of Psychotic Symptoms:No data recorded Hallucinations:Hallucinations: Visual Description of Visual Hallucinations: unable to assess  Ideas of Reference:None  Suicidal Thoughts:Suicidal Thoughts: No  Homicidal Thoughts:Homicidal Thoughts: No   Sensorium  Memory: Immediate Fair; Remote Fair  Judgment: Fair  Insight: Fair   Community education officer  Concentration: Fair  Attention Span: Fair  Recall: [employer] of Knowledge: Fair  Language: Fair   Psychomotor Activity  Psychomotor Activity: Psychomotor Activity: Restlessness   Assets   Assets: Communication Skills; Social Support   Sleep  Sleep: Sleep: Fair    Physical Exam: Physical Exam Musculoskeletal:        General: Normal range of motion.     Cervical back: Normal range of motion.  Neurological:     Mental Status: She is alert.  Psychiatric:        Attention and Perception: She is inattentive.        Mood and Affect: Mood is anxious.        Speech: Speech is slurred.        Behavior: Behavior is cooperative.        Thought Content: Thought content is delusional.        Cognition and Memory: Cognition is impaired.        Judgment: Judgment is inappropriate.    Review of Systems  Psychiatric/Behavioral:  Positive for substance abuse.    Blood pressure 114/73, pulse 99, temperature 98.5 F (36.9 C), resp. rate (!) 27, height 5\' 4"  (1.626 m), weight 51 kg, SpO2 97 %. Body mass index is 19.3 kg/m.    Medical Decision Making: Patient continues to require detox treatment.  Patient is being admitted medically. Started on CIWA protocol, delirium, fall, and seizure precautions.  Also on phenobarbital  withdrawal protocol. Continue multivitamins, IV fluids   Keianna Signer MOTLEY-MANGRUM, PMHNP 11/05/2022, 3:13 PM

## 2022-11-05 NOTE — ED Provider Notes (Signed)
  Physical Exam  BP 122/80   Pulse 62   Temp 97.9 F (36.6 C) (Oral)   Resp 19   Ht 5\' 4"  (1.626 m)   Wt 51 kg   SpO2 98%   BMI 19.30 kg/m   Physical Exam Vitals and nursing note reviewed.  Constitutional:      General: She is not in acute distress.    Appearance: She is well-developed.  HENT:     Head: Normocephalic and atraumatic.  Eyes:     Conjunctiva/sclera: Conjunctivae normal.  Cardiovascular:     Rate and Rhythm: Normal rate and regular rhythm.     Heart sounds: No murmur heard. Pulmonary:     Effort: Pulmonary effort is normal. No respiratory distress.     Breath sounds: Normal breath sounds.  Abdominal:     Palpations: Abdomen is soft.     Tenderness: There is no abdominal tenderness.  Musculoskeletal:        General: No swelling.     Cervical back: Neck supple.  Skin:    General: Skin is warm and dry.     Capillary Refill: Capillary refill takes less than 2 seconds.  Neurological:     Mental Status: She is alert. She is disoriented.     Comments: tremulous  Psychiatric:        Mood and Affect: Mood normal.     Procedures  .Critical Care  Performed by: Teressa Lower, MD Authorized by: Teressa Lower, MD   Critical care provider statement:    Critical care time (minutes):  30   Critical care was necessary to treat or prevent imminent or life-threatening deterioration of the following conditions:  CNS failure or compromise   Critical care was time spent personally by me on the following activities:  Development of treatment plan with patient or surrogate, discussions with consultants, evaluation of patient's response to treatment, examination of patient, ordering and review of laboratory studies, ordering and review of radiographic studies, ordering and performing treatments and interventions, pulse oximetry, re-evaluation of patient's condition and review of old charts   ED Course / MDM   Clinical Course as of 11/05/22 0200  Fri Nov 04, 2022   2302 WBC(!): 10.7 [HN]  2302 Hemoglobin: 12.8 [HN]  2302 I-stat hCG, quantitative: <5.0 [HN]  2344 Comprehensive metabolic panel(!) Mildly elevated glucose, BUN; otherwise unremarkable. No significant lyt ederangements or renal injury [HN]    Clinical Course User Index [HN] Audley Hose, MD   Medical Decision Making Amount and/or Complexity of Data Reviewed Labs:  Decision-making details documented in ED Course.  Risk OTC drugs. Prescription drug management.   Patient received an handoff.  Polysubstance withdrawal and primarily concerned for severe alcohol withdrawal  And benzodiazepine withdrawal.  CIWA scores greater than 20 and at time of signout we are trialing oral Ativan for management of her alcohol and benzodiazepine withdrawal.  However, on reevaluation despite 3 mg p.o. Ativan, patient CIWA score is 22 and we will transition to IV benzodiazepine therapy.  She is tremulous and confused and is not medically clear at this time.  She will require hospitalization for severe alcohol withdrawal.       Teressa Lower, MD 11/05/22 [phone]

## 2022-11-05 NOTE — ED Provider Notes (Signed)
Update on patient care:  I had a discussion with the admitting hospitalist as well as the [facility] pharmacist and our current plan is to continue with Ativan for now but if the inpatient team wants to pursue phenobarbital, there is a linked order set that can be implemented by pharmacy only for phenobarbital.  Please call Pharmacy at [phone] and ask to implement the order set   Teressa Lower, MD 11/05/22 [phone]

## 2022-11-05 NOTE — ED Notes (Signed)
Pt was given apple juice and graham crackers.

## 2022-11-05 NOTE — ED Notes (Addendum)
Patients belongings stored in cabinets 1-8. Includes two necklaces.

## 2022-11-05 NOTE — Progress Notes (Signed)
[facility] NOTE  Pharmacy Consult for Phenobarbital  Indication: Alcohol withdrawal  No Known Allergies   Vital Signs: Temp: 97.9 F (36.6 C) (01/27 0003) Temp Source: Oral (01/27 0003) BP: 114/73 (01/27 0726) Pulse Rate: 57 (01/27 0726) Intake/Output from previous day: No intake/output data recorded. Intake/Output from this shift: No intake/output data recorded.  Labs: Recent Labs    11/04/22 2231  WBC 10.7*  HGB 12.8  HCT 38.6  PLT 437*  CREATININE 0.57  ALBUMIN 4.0  PROT 8.1  AST 40  ALT 39  ALKPHOS 156*  BILITOT 0.5   Estimated Creatinine Clearance: 73.8 mL/min (by C-G formula based on SCr of 0.57 mg/dL).   Medications:  Scheduled:   enoxaparin (LOVENOX) injection  40 mg Subcutaneous Daily   nicotine  21 mg Transdermal Once   phenobarbital  97.2 mg Oral Q8H   Followed by   Derrill Memo ON 11/07/2022] phenobarbital  64.8 mg Oral Q8H   Followed by   Derrill Memo ON 11/09/2022] phenobarbital  32.4 mg Oral Q8H   thiamine  100 mg Oral Daily   Or   thiamine  100 mg Intravenous Daily   Infusions:   PHENObarbital      Assessment: 30 yoF admitted on 1/26.  Patient meets criteria for active DT per Dr. Claria Dice.  Pharmacy is consulted to enter phenobarbital dosing taper.    AUDIT-C: PAWSS: CIWA-Ar range: 15 - 22 Last known drink approximately 5 days ago  Plan:  Phenobarbital 260mg  IV once Phenobarbital 97.2 mg PO q8h x6 doses followed by Phenobarbital 64.8 mg PO q8h x6 doses followed by Phenobarbital 32.4 mg PO q8h x6 doses. Lorazepam 1mg  IV q4h prn agitation    Gretta Arab PharmD, BCPS [facility] main pharmacy [phone] 11/05/2022 8:48 AM

## 2022-11-06 DIAGNOSIS — F410 Panic disorder [episodic paroxysmal anxiety] without agoraphobia: Secondary | ICD-10-CM

## 2022-11-06 LAB — CBC WITH DIFFERENTIAL/PLATELET
Abs Immature Granulocytes: 0.02 10*3/uL (ref 0.00–0.07)
Basophils Absolute: 0 10*3/uL (ref 0.0–0.1)
Basophils Relative: 1 %
Eosinophils Absolute: 0 10*3/uL (ref 0.0–0.5)
Eosinophils Relative: 1 %
HCT: 38 % (ref 36.0–46.0)
Hemoglobin: 12.5 g/dL (ref 12.0–15.0)
Immature Granulocytes: 0 %
Lymphocytes Relative: 28 %
Lymphs Abs: 1.8 10*3/uL (ref 0.7–4.0)
MCH: 28.5 pg (ref 26.0–34.0)
MCHC: 32.9 g/dL (ref 30.0–36.0)
MCV: 86.8 fL (ref 80.0–100.0)
Monocytes Absolute: 0.5 10*3/uL (ref 0.1–1.0)
Monocytes Relative: 7 %
Neutro Abs: 4.1 10*3/uL (ref 1.7–7.7)
Neutrophils Relative %: 63 %
Platelets: 328 10*3/uL (ref 150–400)
RBC: 4.38 MIL/uL (ref 3.87–5.11)
RDW: 12.8 % (ref 11.5–15.5)
WBC: 6.4 10*3/uL (ref 4.0–10.5)
nRBC: 0 % (ref 0.0–0.2)

## 2022-11-06 LAB — BASIC METABOLIC PANEL
Anion gap: 7 (ref 5–15)
BUN: 13 mg/dL (ref 6–20)
CO2: 25 mmol/L (ref 22–32)
Calcium: 8.4 mg/dL — ABNORMAL LOW (ref 8.9–10.3)
Chloride: 103 mmol/L (ref 98–111)
Creatinine, Ser: 0.59 mg/dL (ref 0.44–1.00)
GFR, Estimated: >60 mL/min (ref >60)
Glucose, Bld: 105 mg/dL — ABNORMAL HIGH (ref 70–99)
Potassium: 3.1 mmol/L — ABNORMAL LOW (ref 3.5–5.1)
Sodium: 135 mmol/L (ref 135–145)

## 2022-11-06 LAB — MAGNESIUM: Magnesium: 2.5 mg/dL — ABNORMAL HIGH (ref 1.7–2.4)

## 2022-11-06 MED ORDER — POTASSIUM CHLORIDE CRYS ER 20 MEQ PO TBCR
40.0000 meq | EXTENDED_RELEASE_TABLET | Freq: Once | ORAL | Status: AC
  Administered 2022-11-06: 40 meq via ORAL
  Filled 2022-11-06: qty 2

## 2022-11-06 MED ORDER — GABAPENTIN 100 MG PO CAPS
100.0000 mg | ORAL_CAPSULE | Freq: Three times a day (TID) | ORAL | Status: DC
  Administered 2022-11-06 – 2022-11-07 (×3): 100 mg via ORAL
  Filled 2022-11-06 (×3): qty 1

## 2022-11-06 NOTE — Consult Note (Signed)
[facility] Consult   Reason for Consult:''Presented from severe withdrawal from alcohol/benzodiazepines. Psychiatry had seen her in the emergency department and was planned for detoxification.''  Referring Physician:  Shelly Coss, MD Patient Identification: KASSIA DEMARINIS MRN:  [id number] Principal Diagnosis: Polysubstance abuse [facility]) Diagnosis:  Principal Problem:   Polysubstance abuse ([place]) Active Problems:   Polysubstance (including opioids) dependence with physiological dependence ([place])   Panic disorder   Bipolar 1 disorder ([place])   Alcohol dependence with intoxication with complication ([place])   Benzodiazepine dependence ([place])   Heroin abuse ([place])   IVDU (intravenous drug user)   History of ETOH abuse   Methadone use   Tobacco abuse   Alcohol withdrawal ([place])   Alcohol withdrawal delirium ([place])   Benzodiazepine withdrawal ([place])   Total Time spent with patient: 1 hour  Subjective:   TERRYN ROSENKRANZ is a 42 y.o. female patient admitted with dehydration and drug withdrawal.  HPI: Patient is a 41 y.o. female with history of bipolar disorder, alcohol dependence, IVDU, tobacco use, polysubstance abuse(Etoh, cannabis,benzo & opioid), who presents with dehydration and drug withdrawal symptoms. Patient reports history of substance abuse dating back to age 18 but  can only remember one rehab history in 2012. She reports being sobered from then till 2017 when she relapsed following the death of her wife. She reports being detox and placed on [facility]. However, she relapsed again from days ago. She combined $50 dollar worth of Fentanyl with Methadone, alcohol, Xanax and Cannabis following which she had to be brought to the ED for treatment due to dehydration, anxiety, body tremors, hallucinations and paranoia. Today, she continues to have withdrawal symptoms but denies perceptual disturbances and self harming thoughts. She is  requesting for referral to drug & alcohol rehab facility after she is medically stable.   Past Psychiatric History: as above  Risk to Self:  denies Risk to Others:  denies Prior Inpatient Therapy:  yes, in the past Prior Outpatient Therapy:  [facility]  Past Medical History:  Past Medical History:  Diagnosis Date   Anxiety    Anxiety    Depression    Insomnia    Polysubstance abuse ([place])    Seizures ([place])     Past Surgical History:  Procedure Laterality Date   BIOPSY  07/14/2022   Procedure: BIOPSY;  Surgeon: Ronnette Juniper, MD;  Location: [facility];  Service: Gastroenterology;;   COLONOSCOPY N/A 07/14/2022   Procedure: COLONOSCOPY;  Surgeon: Ronnette Juniper, MD;  Location: [facility];  Service: Gastroenterology;  Laterality: N/A;   NO PAST SURGERIES     SHOULDER SURGERY     WISDOM TOOTH EXTRACTION     Family History: History reviewed. No pertinent family history. Family Psychiatric  History: denies Social History:  Social History   Substance and Sexual Activity  Alcohol Use Yes   Alcohol/week: 21.0 standard drinks of alcohol   Types: 21 Standard drinks or equivalent per week   Comment: denies     Social History   Substance and Sexual Activity  Drug Use Yes   Types: Marijuana, Oxycodone, Heroin   Comment: denies    Social History   Socioeconomic History   Marital status: Single    Spouse name: Not on file   Number of children: Not on file   Years of education: Not on file   Highest education level: Not on file  Occupational History   Not on file  Tobacco Use   Smoking status: Every Day    Packs/day: 1.00    Years: 11.00    Total pack years: 11.00    Types: Cigarettes   Smokeless tobacco: Never  Substance and Sexual Activity   Alcohol use: Yes    Alcohol/week: 21.0 standard drinks of alcohol    Types: 21 Standard drinks or equivalent per week    Comment: denies   Drug use: Yes    Types: Marijuana, Oxycodone, Heroin    Comment:  denies   Sexual activity: Not on file  Other Topics Concern   Not on file  Social History Narrative   Not on file   Social Determinants of Health   Financial Resource Strain: Not on file  Food Insecurity: Food Insecurity Present (11/05/2022)   Hunger Vital Sign    Worried About Running Out of Food in the Last Year: Often true    Ran Out of Food in the Last Year: Often true  Transportation Needs: Unmet Transportation Needs (11/05/2022)   PRAPARE - Administrator, Civil Service (Medical): Yes    Lack of Transportation (Non-Medical): Yes  Physical Activity: Not on file  Stress: Not on file  Social Connections: Not on file   Additional Social History:    Allergies:  No Known Allergies  Labs:  Results for orders placed or performed during the hospital encounter of 11/04/22 (from the past 48 hour(s))  CBC with Differential     Status: Abnormal   Collection Time: 11/04/22 10:31 PM  Result Value Ref Range   WBC 10.7 (H) 4.0 - 10.5 K/uL   RBC 4.45 3.87 - 5.11 MIL/uL   Hemoglobin 12.8 12.0 - 15.0 g/dL   HCT 63.7 85.8 - 85.0 %   MCV 86.7 80.0 - 100.0 fL   MCH 28.8 26.0 - 34.0 pg   MCHC 33.2 30.0 - 36.0 g/dL   RDW 27.7 41.2 - 87.8 %   Platelets 437 (H) 150 - 400 K/uL   nRBC 0.0 0.0 - 0.2 %   Neutrophils Relative % 80 %   Neutro Abs 8.4 (H) 1.7 - 7.7 K/uL   Lymphocytes Relative 14 %   Lymphs Abs 1.5 0.7 - 4.0 K/uL   Monocytes Relative 6 %   Monocytes Absolute 0.7 0.1 - 1.0 K/uL   Eosinophils Relative 0 %   Eosinophils Absolute 0.0 0.0 - 0.5 K/uL   Basophils Relative 0 %   Basophils Absolute 0.0 0.0 - 0.1 K/uL   Immature Granulocytes 0 %   Abs Immature Granulocytes 0.04 0.00 - 0.07 K/uL    Comment: Performed at [facility], [address] [place], [place] [address]  Comprehensive metabolic panel     Status: Abnormal   Collection Time: 11/04/22 10:31 PM  Result Value Ref Range   Sodium 135 135 - 145 mmol/L   Potassium 3.5 3.5 - 5.1 mmol/L    Chloride 99 98 - 111 mmol/L   CO2 26 22 - 32 mmol/L   Glucose, Bld 165 (H) 70 - 99 mg/dL    Comment: Glucose reference range applies only to samples taken after fasting for at least 8 hours.   BUN 22 (H) 6 - 20 mg/dL   Creatinine, Ser 0.94 0.44 - 1.00 mg/dL   Calcium 9.0 8.9 - 70.9 mg/dL   Total Protein 8.1 6.5 - 8.1 g/dL   Albumin 4.0 3.5 - 5.0 g/dL   AST 40 15 - 41 U/L   ALT 39 0 -  44 U/L   Alkaline Phosphatase 156 (H) 38 - 126 U/L   Total Bilirubin 0.5 0.3 - 1.2 mg/dL   GFR, Estimated >20 >94 mL/min    Comment: (NOTE) Calculated using the CKD-EPI Creatinine Equation (2021)    Anion gap 10 5 - 15    Comment: Performed at [facility], [address], [place], [place] [address]  Ethanol     Status: None   Collection Time: 11/04/22 10:31 PM  Result Value Ref Range   Alcohol, Ethyl (B) <10 <10 mg/dL    Comment: (NOTE) Lowest detectable limit for serum alcohol is 10 mg/dL.  For medical purposes only. Performed at [facility], [address] [place], [place] [address]   I-Stat beta hCG blood, ED     Status: None   Collection Time: 11/04/22 10:36 PM  Result Value Ref Range   I-stat hCG, quantitative <5.0 <5 mIU/mL   Comment 3            Comment:   GEST. AGE      CONC.  (mIU/mL)   <=1 WEEK        5 - 50     2 WEEKS       50 - 500     3 WEEKS       100 - 10,000     4 WEEKS     1,000 - 30,000        FEMALE AND NON-PREGNANT FEMALE:     LESS THAN 5 mIU/mL   Urinalysis, Routine w reflex microscopic -Urine, Clean Catch     Status: Abnormal   Collection Time: 11/05/22 12:35 AM  Result Value Ref Range   Color, Urine YELLOW YELLOW   APPearance HAZY (A) CLEAR   Specific Gravity, Urine 1.033 (H) 1.005 - 1.030   pH 6.0 5.0 - 8.0   Glucose, UA NEGATIVE NEGATIVE mg/dL   Hgb urine dipstick NEGATIVE NEGATIVE   Bilirubin Urine NEGATIVE NEGATIVE   Ketones, ur 20 (A) NEGATIVE mg/dL   Protein, ur NEGATIVE NEGATIVE mg/dL   Nitrite NEGATIVE NEGATIVE    Leukocytes,Ua NEGATIVE NEGATIVE    Comment: Performed at [facility], [address] [place], [place] [address]  Rapid urine drug screen (hospital performed)     Status: Abnormal   Collection Time: 11/05/22 12:35 AM  Result Value Ref Range   Opiates NONE DETECTED NONE DETECTED   Cocaine NONE DETECTED NONE DETECTED   Benzodiazepines POSITIVE (A) NONE DETECTED   Amphetamines NONE DETECTED NONE DETECTED   Tetrahydrocannabinol POSITIVE (A) NONE DETECTED   Barbiturates NONE DETECTED NONE DETECTED    Comment: (NOTE) DRUG SCREEN FOR MEDICAL PURPOSES ONLY.  IF CONFIRMATION IS NEEDED FOR ANY PURPOSE, NOTIFY LAB WITHIN 5 DAYS.  LOWEST DETECTABLE LIMITS FOR URINE DRUG SCREEN Drug Class                     Cutoff (ng/mL) Amphetamine and metabolites    1000 Barbiturate and metabolites    200 Benzodiazepine                 200 Opiates and metabolites        300 Cocaine and metabolites        300 THC                            50 Performed at [facility], [address] [place],  [place] [address]   CBC     Status: Abnormal   Collection Time: 11/05/22 10:45 AM  Result Value Ref Range   WBC 11.7 (H) 4.0 - 10.5 K/uL   RBC 4.37 3.87 - 5.11 MIL/uL   Hemoglobin 12.6 12.0 - 15.0 g/dL   HCT 37.9 36.0 - 46.0 %   MCV 86.7 80.0 - 100.0 fL   MCH 28.8 26.0 - 34.0 pg   MCHC 33.2 30.0 - 36.0 g/dL   RDW 12.7 11.5 - 15.5 %   Platelets 379 150 - 400 K/uL   nRBC 0.0 0.0 - 0.2 %    Comment: Performed at [facility], [place] [address], [place], [place] [address]  Creatinine, serum     Status: None   Collection Time: 11/05/22 10:45 AM  Result Value Ref Range   Creatinine, Ser 0.66 0.44 - 1.00 mg/dL   GFR, Estimated >60 >60 mL/min    Comment: (NOTE) Calculated using the CKD-EPI Creatinine Equation (2021) Performed at [facility], [place] [address] [place], [place] [address]   Basic metabolic panel     Status: Abnormal    Collection Time: 11/06/22  5:29 AM  Result Value Ref Range   Sodium 135 135 - 145 mmol/L   Potassium 3.1 (L) 3.5 - 5.1 mmol/L   Chloride 103 98 - 111 mmol/L   CO2 25 22 - 32 mmol/L   Glucose, Bld 105 (H) 70 - 99 mg/dL    Comment: Glucose reference range applies only to samples taken after fasting for at least 8 hours.   BUN 13 6 - 20 mg/dL   Creatinine, Ser 0.59 0.44 - 1.00 mg/dL   Calcium 8.4 (L) 8.9 - 10.3 mg/dL   GFR, Estimated >60 >60 mL/min    Comment: (NOTE) Calculated using the CKD-EPI Creatinine Equation (2021)    Anion gap 7 5 - 15    Comment: Performed at [facility], [place] [address], [place], [place] [address]  Magnesium     Status: Abnormal   Collection Time: 11/06/22  5:29 AM  Result Value Ref Range   Magnesium 2.5 (H) 1.7 - 2.4 mg/dL    Comment: Performed at [facility], [place] [address], [place], [place] [address]  CBC with Differential/Platelet     Status: None   Collection Time: 11/06/22  5:29 AM  Result Value Ref Range   WBC 6.4 4.0 - 10.5 K/uL   RBC 4.38 3.87 - 5.11 MIL/uL   Hemoglobin 12.5 12.0 - 15.0 g/dL   HCT 38.0 36.0 - 46.0 %   MCV 86.8 80.0 - 100.0 fL   MCH 28.5 26.0 - 34.0 pg   MCHC 32.9 30.0 - 36.0 g/dL   RDW 12.8 11.5 - 15.5 %   Platelets 328 150 - 400 K/uL   nRBC 0.0 0.0 - 0.2 %   Neutrophils Relative % 63 %   Neutro Abs 4.1 1.7 - 7.7 K/uL   Lymphocytes Relative 28 %   Lymphs Abs 1.8 0.7 - 4.0 K/uL   Monocytes Relative 7 %   Monocytes Absolute 0.5 0.1 - 1.0 K/uL   Eosinophils Relative 1 %   Eosinophils Absolute 0.0 0.0 - 0.5 K/uL   Basophils Relative 1 %   Basophils Absolute 0.0 0.0 - 0.1 K/uL   Immature Granulocytes 0 %   Abs Immature Granulocytes 0.02 0.00 - 0.07 K/uL    Comment: Performed at [facility], [place] [address] [place], [place] [address]  Current Facility-Administered Medications  Medication Dose Route Frequency Provider Last Rate Last Admin   0.9 %  sodium chloride  infusion   Intravenous Continuous Burnadette PopAdhikari, Amrit, MD 100 mL/hr at 11/06/22 0507 New Bag at 11/06/22 0507   enoxaparin (LOVENOX) injection 40 mg  40 mg Subcutaneous Daily Gery Prayrosley, Debby, MD   40 mg at 11/06/22 0948   LORazepam (ATIVAN) injection 1 mg  1 mg Intravenous Q4H PRN Gery Prayrosley, Debby, MD   1 mg at 11/05/22 2229   nicotine (NICODERM CQ - dosed in mg/24 hours) patch 21 mg  21 mg Transdermal Once Kommor, [place], MD       ondansetron (ZOFRAN) tablet 4 mg  4 mg Oral Q6H PRN Crosley, Debby, MD       Or   ondansetron (ZOFRAN) injection 4 mg  4 mg Intravenous Q6H PRN Crosley, Debby, MD       PHENobarbital (LUMINAL) tablet 97.2 mg  97.2 mg Oral Q8H Adhikari, Amrit, MD   97.2 mg at 11/06/22 [id number]   Followed by   Melene Muller[START ON 11/07/2022] PHENobarbital (LUMINAL) tablet 64.8 mg  64.8 mg Oral Q8H Adhikari, Amrit, MD       Followed by   Melene Muller[START ON 11/09/2022] PHENobarbital (LUMINAL) tablet 32.4 mg  32.4 mg Oral Q8H Adhikari, Amrit, MD       senna-docusate (Senokot-S) tablet 1 tablet  1 tablet Oral QHS PRN Crosley, Debby, MD       thiamine (VITAMIN B1) tablet 100 mg  100 mg Oral Daily Loetta RoughNaasz, Hayley N, MD   100 mg at 11/06/22 [id number]   Or   thiamine (VITAMIN B1) injection 100 mg  100 mg Intravenous Daily Loetta RoughNaasz, Hayley N, MD   100 mg at 11/05/22 1015    Musculoskeletal: Strength & Muscle Tone: within normal limits Gait & Station: unsteady Patient leans: Right   Psychiatric Specialty Exam:  Presentation  General Appearance:  Appropriate for Environment  Eye Contact: Fleeting  Speech: Clear and Coherent  Speech Volume: Normal  Handedness: Right   Mood and Affect  Mood: Anxious  Affect: Congruent   Thought Process  Thought Processes: Goal Directed  Descriptions of Associations:Intact  Orientation:Full (Time, Place and Person)  Thought Content:Logical  History of Schizophrenia/Schizoaffective disorder:No data recorded Duration of Psychotic Symptoms:No data  recorded Hallucinations:Hallucinations: None Description of Visual Hallucinations: unable to assess  Ideas of Reference:None  Suicidal Thoughts:Suicidal Thoughts: No  Homicidal Thoughts:Homicidal Thoughts: No   Sensorium  Memory: Immediate Good; Recent Good; Remote Good  Judgment: Poor  Insight: Fair   Art therapistxecutive Functions  Concentration: Fair  Attention Span: Fair  Recall: Fair  Fund of Knowledge: Good  Language: Good   Psychomotor Activity  Psychomotor Activity: Psychomotor Activity: Increased   Assets  Assets: Desire for Improvement; Communication Skills   Sleep  Sleep: Sleep: Fair   Physical Exam: Physical Exam Review of Systems  Psychiatric/Behavioral:  Positive for substance abuse. Negative for depression, hallucinations and suicidal ideas. The patient is nervous/anxious. The patient does not have insomnia.    Blood pressure 110/64, pulse (!) 110, temperature 99.2 F (37.3 C), temperature source Oral, resp. rate 20, height 5\' 4"  (1.626 m), weight 47 kg, SpO2 100 %. Body mass index is 17.79 kg/m.  Treatment Plan Summary: 42 year old female with long history of polysubstance dependence who was admitted to the medical floor for treatment of dehydration, seizure and drug withdrawal symptoms. Today, she reports improved symptoms of withdrawal as evidenced by decreased anxiety, tremors, irritability and apprehensions. She denies perceptual  disturbances and self harming thoughts. However, she is requesting for referral to drug & alcohol rehab facility after she is medically stabilized.  Recommendations: -Consider TOC/Social worker referral to facilitate placement in Drug/alcohol rehab facility upon discharge. -Continue CIWA and Lorazepam withdrawal protocol -Add Gabapentin 100 mg twice daily for anxiety/alcohol withdrawal/mood  Disposition: No evidence of imminent risk to self or others at present.   Patient does not meet criteria for psychiatric  inpatient admission. Supportive therapy provided about ongoing stressors. Patient needs referral to Drug/alcohol rehab facility upon discharge. Psychiatric service will follow patient as needed  Thedore Mins, MD 11/06/2022 12:02 PM

## 2022-11-06 NOTE — Progress Notes (Signed)
PROGRESS NOTE  Rita Carey  XBD:[id number] DOB: 10-Apr-1981 DOA: 11/04/2022 PCP: Pcp, No   Brief Narrative: Patient is a 42 year old female with history of alcohol abuse, tobacco abuse, IV fentanyl abuse, marijuana abuse who was brought to the emergency department with withdrawal symptoms.  Takes fentanyl from the streets, injects.  Also drinks a bottle of wine a day.  Takes Xanax.  She was seen by psychiatry in the emergency department.  As per the report, she was unable to sleep, anxiety.  Her friend brought her to the emergency department for help.  She was found to have sad mood with flat affect but no suicidal ideation.  Psychiatry seen her in the emergency department on 1/27 and recommended detox treatment.  There was concern for severe alcohol withdrawal, benzodiazepine withdrawal with CIWA score greater than 20 and we were requested for admission.  Psychiatry also following.  Assessment & Plan:  Principal Problem:   Polysubstance abuse ([place]) Active Problems:   Benzodiazepine dependence ([place])   Heroin abuse ([place])   IVDU (intravenous drug user)   Bipolar 1 disorder ([place])   History of ETOH abuse   Panic disorder   Alcohol dependence with intoxication with complication ([place])   Methadone use   Tobacco abuse   Alcohol withdrawal ([place])   Alcohol withdrawal delirium ([place])   Benzodiazepine withdrawal ([place])  Alcohol withdrawal/benzodiazepine withdrawal: Started on CIWA protocol.  Also on phenobarbital  withdrawal protocol. Continue multivitamins, IV fluids Followed by psychiatry.  She is still intermittently confused, tremulous but overall oriented.  More alert and awake today.   Fentanyl use/marijuana use: Counseled for cessation.  Patient is motivated to get enrolled into detoxification program.   Tobacco use: Smokes half pack a day.  Continue nicotine patch  Hypokalemia: Supplement with potassium   History of bipolar disorder         DVT prophylaxis:enoxaparin (LOVENOX)  injection 40 mg Start: 11/05/22 1000 SCDs Start: 11/05/22 0656     Code Status: Full Code  Family Communication: None at bedside  Patient status:Inpatient  Patient is from :Home  Anticipated discharge ST:[facility] vs inpatient psych  Estimated DC date: 1 to 2 days .  Needs psychiatry clearance before discharge   Consultants: Psychiatry  Procedures: None  Antimicrobials:  Anti-infectives (From admission, onward)    None       Subjective: Patient seen and examined at bedside today.  Hemodynamically stable.  Lying in bed.  More oriented than yesterday.  Denies significant complaints except for feeling weak  Objective: Vitals:   11/05/22 1546 11/05/22 2001 11/05/22 2357 11/06/22 0444  BP: 103/74 110/69 111/66 (!) 99/57  Pulse: 80 85 66 (!) 58  Resp: 20 20 20 20   Temp: 98.6 F (37 C) 99.5 F (37.5 C) (!) 97.4 F (36.3 C)   TempSrc: Oral Oral Oral   SpO2: 100% 100% 98% 98%  Weight: 47 kg     Height:        Intake/Output Summary (Last 24 hours) at 11/06/2022 1039 Last data filed at 11/06/2022 0600 Gross per 24 hour  Intake 1982.88 ml  Output --  Net 1982.88 ml   Filed Weights   11/04/22 2005 11/05/22 1546  Weight: 51 kg 47 kg    Examination:  General exam: Overall comfortable, sleeping,not in distress HEENT: PERRL Respiratory system:  no wheezes or crackles  Cardiovascular system: S1 & S2 heard, RRR.  Gastrointestinal system: Abdomen is nondistended, soft and nontender. Central nervous system: Alert and mostly oriented Extremities: No edema, no  clubbing ,no cyanosis Skin: Tattoos, needle marks, skin piercing   Data Reviewed: I have personally reviewed following labs and imaging studies  CBC: Recent Labs  Lab 11/04/22 2231 11/05/22 1045 11/06/22 0529  WBC 10.7* 11.7* 6.4  NEUTROABS 8.4*  --  4.1  HGB 12.8 12.6 12.5  HCT 38.6 37.9 38.0  MCV 86.7 86.7 86.8  PLT 437* 379 315   Basic Metabolic Panel: Recent Labs  Lab 11/04/22 2231 11/05/22 1045  11/06/22 0529  NA 135  --  135  K 3.5  --  3.1*  CL 99  --  103  CO2 26  --  25  GLUCOSE 165*  --  105*  BUN 22*  --  13  CREATININE 0.57 0.66 0.59  CALCIUM 9.0  --  8.4*  MG  --   --  2.5*     No results found for this or any previous visit (from the past 240 hour(s)).   Radiology Studies: No results found.  Scheduled Meds:  enoxaparin (LOVENOX) injection  40 mg Subcutaneous Daily   nicotine  21 mg Transdermal Once   PHENobarbital  97.2 mg Oral Q8H   Followed by   Derrill Memo ON 11/07/2022] PHENobarbital  64.8 mg Oral Q8H   Followed by   Derrill Memo ON 11/09/2022] phenobarbital  32.4 mg Oral Q8H   thiamine  100 mg Oral Daily   Or   thiamine  100 mg Intravenous Daily   Continuous Infusions:  sodium chloride 100 mL/hr at 11/06/22 0507     LOS: 1 day   Shelly Coss, MD [facility] P1/28/2024, 10:39 AM

## 2022-11-07 ENCOUNTER — Other Ambulatory Visit (HOSPITAL_COMMUNITY): Payer: Self-pay

## 2022-11-07 LAB — BASIC METABOLIC PANEL
Anion gap: 7 (ref 5–15)
BUN: 11 mg/dL (ref 6–20)
CO2: 21 mmol/L — ABNORMAL LOW (ref 22–32)
Calcium: 8 mg/dL — ABNORMAL LOW (ref 8.9–10.3)
Chloride: 104 mmol/L (ref 98–111)
Creatinine, Ser: 0.54 mg/dL (ref 0.44–1.00)
GFR, Estimated: >60 mL/min (ref >60)
Glucose, Bld: 102 mg/dL — ABNORMAL HIGH (ref 70–99)
Potassium: 3.6 mmol/L (ref 3.5–5.1)
Sodium: 132 mmol/L — ABNORMAL LOW (ref 135–145)

## 2022-11-07 MED ORDER — NICOTINE 21 MG/24HR TD PT24
21.0000 mg | MEDICATED_PATCH | Freq: Every day | TRANSDERMAL | 0 refills | Status: DC
  Filled 2022-11-07: qty 28, 28d supply, fill #0

## 2022-11-07 MED ORDER — VITAMIN B-1 100 MG PO TABS
100.0000 mg | ORAL_TABLET | Freq: Every day | ORAL | 1 refills | Status: DC
  Filled 2022-11-07: qty 100, 100d supply, fill #0

## 2022-11-07 MED ORDER — GABAPENTIN 100 MG PO CAPS
100.0000 mg | ORAL_CAPSULE | Freq: Three times a day (TID) | ORAL | 0 refills | Status: DC
  Filled 2022-11-07: qty 90, 30d supply, fill #0

## 2022-11-07 MED ORDER — FOLIC ACID 1 MG PO TABS
1.0000 mg | ORAL_TABLET | Freq: Every day | ORAL | 1 refills | Status: DC
  Filled 2022-11-07: qty 30, 30d supply, fill #0

## 2022-11-07 NOTE — Discharge Summary (Signed)
Physician Discharge Summary  Rita Carey XBJ:[id number] DOB: 17-Jan-1981 DOA: 11/04/2022  PCP: Pcp, No  Admit date: 11/04/2022 Discharge date: 11/07/2022  Admitted From: Home Disposition:  Home  Discharge Condition:Stable CODE STATUS:FULL Diet recommendation:  Regular   Brief/Interim Summary:  Patient is a 42 year old female with history of alcohol abuse, tobacco abuse, IV fentanyl abuse, marijuana abuse who was brought to the emergency department with withdrawal symptoms.  Takes fentanyl from the streets, injects.  Also drinks a bottle of wine a day.  Takes Xanax.  She was seen by psychiatry in the emergency department.  As per the report, she was unable to sleep, anxiety.  Her friend brought her to the emergency department for help.  She was found to have sad mood with flat affect but no suicidal ideation.  Psychiatry seen her in the emergency department on 1/27 and recommended detox treatment.  There was concern for severe alcohol withdrawal, benzodiazepine withdrawal with CIWA score greater than 20 and we were requested for admission.  Psychiatry was also following.  She was started on detox protocol for alcohol and benzodiazepine. Mentation significantly improved.  Currently she is alert and oriented.  She is interested to pursue alcohol rehabilitation as an outpatient.  Psychiatry cleared her for discharge.  Medically stable for discharge today.  Use the provided rehab medicine services formation.    Following problems were addressed during the hospitalization:   Discharge Diagnoses:  Principal Problem:   Polysubstance abuse (HCC) Active Problems:   Polysubstance (including opioids) dependence with physiological dependence (HCC)   Benzodiazepine dependence (HCC)   Heroin abuse (HCC)   IVDU (intravenous drug user)   Bipolar 1 disorder (HCC)   History of ETOH abuse   Panic disorder   Alcohol dependence with intoxication with complication (HCC)   Methadone use   Tobacco abuse    Alcohol withdrawal (HCC)   Alcohol withdrawal delirium (HCC)   Benzodiazepine withdrawal (HCC)    Discharge Instructions  Discharge Instructions     Diet general   Complete by: As directed    Discharge instructions   Complete by: As directed    1)Please quit alcohol and drugs 2)Follow up with alcohol rehab services 3)Follow up with psychiatry as an outpatient   Increase activity slowly   Complete by: As directed       Allergies as of 11/07/2022   No Known Allergies      Medication List     TAKE these medications    acetaminophen 500 MG tablet Commonly known as: TYLENOL Take 1 tablet (500 mg total) by mouth every 6 (six) hours as needed.   ALPRAZolam 0.5 MG tablet Commonly known as: Xanax Take 1 tablet (0.5 mg total) by mouth 3 (three) times daily as needed for sleep or anxiety. What changed:  how much to take when to take this   Excedrin Migraine 250-250-65 MG tablet Generic drug: aspirin-acetaminophen-caffeine Take 1 tablet by mouth every 6 (six) hours as needed for headache or migraine.   folic acid 1 MG tablet Commonly known as: FOLVITE Take 1 tablet (1 mg total) by mouth daily.   gabapentin 100 MG capsule Commonly known as: NEURONTIN Take 1 capsule (100 mg total) by mouth 3 (three) times daily.   methadone 10 MG/ML solution Commonly known as: DOLOPHINE Take 67 mg by mouth in the morning.   multivitamin with minerals Tabs tablet Take 1 tablet by mouth daily.   nicotine 21 mg/24hr patch Commonly known as: NICODERM CQ - dosed in mg/24 hours  Place 1 patch (21 mg total) onto the skin daily.   thiamine 100 MG tablet Commonly known as: Vitamin B-1 Take 1 tablet (100 mg total) by mouth daily.        No Known Allergies  Consultations: Psychiatry   Procedures/Studies: No results found.    Subjective: Patient seen and examined at bedside today.  Hemodynamically stable for discharge.  Discharge Exam: Vitals:   11/06/22 1923 11/07/22  0520  BP: 116/74 110/73  Pulse: (!) 104 82  Resp: 20 20  Temp: 98.9 F (37.2 C) 98.3 F (36.8 C)  SpO2: 99% 99%   Vitals:   11/06/22 0444 11/06/22 1154 11/06/22 1923 11/07/22 0520  BP: (!) 99/57 110/64 116/74 110/73  Pulse: (!) 58 (!) 110 (!) 104 82  Resp: 20 20 20 20   Temp:  99.2 F (37.3 C) 98.9 F (37.2 C) 98.3 F (36.8 C)  TempSrc:  Oral Oral Oral  SpO2: 98% 100% 99% 99%  Weight:      Height:        General: Pt is alert, awake, not in acute distress Cardiovascular: RRR, S1/S2 +, no rubs, no gallops Respiratory: CTA bilaterally, no wheezing, no rhonchi Abdominal: Soft, NT, ND, bowel sounds + Extremities: no edema, no cyanosis Skin: Tattoos, needle marks    The results of significant diagnostics from this hospitalization (including imaging, microbiology, ancillary and laboratory) are listed below for reference.     Microbiology: No results found for this or any previous visit (from the past 240 hour(s)).   Labs: BNP (last 3 results) No results for input(s): "BNP" in the last 8760 hours. Basic Metabolic Panel: Recent Labs  Lab 11/04/22 2231 11/05/22 1045 11/06/22 0529 11/07/22 0503  NA 135  --  135 132*  K 3.5  --  3.1* 3.6  CL 99  --  103 104  CO2 26  --  25 21*  GLUCOSE 165*  --  105* 102*  BUN 22*  --  13 11  CREATININE 0.57 0.66 0.59 0.54  CALCIUM 9.0  --  8.4* 8.0*  MG  --   --  2.5*  --    Liver Function Tests: Recent Labs  Lab 11/04/22 2231  AST 40  ALT 39  ALKPHOS 156*  BILITOT 0.5  PROT 8.1  ALBUMIN 4.0   No results for input(s): "LIPASE", "AMYLASE" in the last 168 hours. No results for input(s): "AMMONIA" in the last 168 hours. CBC: Recent Labs  Lab 11/04/22 2231 11/05/22 1045 11/06/22 0529  WBC 10.7* 11.7* 6.4  NEUTROABS 8.4*  --  4.1  HGB 12.8 12.6 12.5  HCT 38.6 37.9 38.0  MCV 86.7 86.7 86.8  PLT 437* 379 328   Cardiac Enzymes: No results for input(s): "CKTOTAL", "CKMB", "CKMBINDEX", "TROPONINI" in the last 168  hours. BNP: Invalid input(s): "POCBNP" CBG: No results for input(s): "GLUCAP" in the last 168 hours. D-Dimer No results for input(s): "DDIMER" in the last 72 hours. Hgb A1c No results for input(s): "HGBA1C" in the last 72 hours. Lipid Profile No results for input(s): "CHOL", "HDL", "LDLCALC", "TRIG", "CHOLHDL", "LDLDIRECT" in the last 72 hours. Thyroid function studies No results for input(s): "TSH", "T4TOTAL", "T3FREE", "THYROIDAB" in the last 72 hours.  Invalid input(s): "FREET3" Anemia work up No results for input(s): "VITAMINB12", "FOLATE", "FERRITIN", "TIBC", "IRON", "RETICCTPCT" in the last 72 hours. Urinalysis    Component Value Date/Time   COLORURINE YELLOW 11/05/2022 0035   APPEARANCEUR HAZY (A) 11/05/2022 0035   LABSPEC 1.033 (H) 11/05/2022 3500  PHURINE 6.0 11/05/2022 0035   GLUCOSEU NEGATIVE 11/05/2022 0035   HGBUR NEGATIVE 11/05/2022 0035   BILIRUBINUR NEGATIVE 11/05/2022 0035   KETONESUR 20 (A) 11/05/2022 0035   PROTEINUR NEGATIVE 11/05/2022 0035   UROBILINOGEN 0.2 12/28/2013 1207   NITRITE NEGATIVE 11/05/2022 0035   LEUKOCYTESUR NEGATIVE 11/05/2022 0035   Sepsis Labs Recent Labs  Lab 11/04/22 2231 11/05/22 1045 11/06/22 0529  WBC 10.7* 11.7* 6.4   Microbiology No results found for this or any previous visit (from the past 240 hour(s)).  Please note: You were cared for by a hospitalist during your hospital stay. Once you are discharged, your primary care physician will handle any further medical issues. Please note that NO REFILLS for any discharge medications will be authorized once you are discharged, as it is imperative that you return to your primary care physician (or establish a relationship with a primary care physician if you do not have one) for your post hospital discharge needs so that they can reassess your need for medications and monitor your lab values.    Time coordinating discharge: 40 minutes  SIGNED:   Shelly Coss, MD  [facility] 11/07/2022, 10:20 AM Pager [phone]  If 7PM-7AM, please contact night-coverage [email or web address] Password [email or web address]

## 2022-11-07 NOTE — TOC Transition Note (Signed)
Transition of Care [facility]) - CM/SW Discharge Note   Patient Details  Name: Rita Carey MRN: [id number] Date of Birth: 1981/01/04  [facility] CM/SW Contact:  Dessa Phi, RN Phone Number: 11/07/2022, 10:03 AM   Clinical Narrative: d/c home w/pcp list-informed to also contact insurance cust service for pcp listing in network. Otpt rehab detox provided/inpt detox list provided as resource. No further CM needs.      Final next level of care: Home/Self Care Barriers to Discharge: No Barriers Identified   Patient Goals and CMS Choice      Discharge Placement                         Discharge Plan and Services Additional resources added to the After Visit Summary for                                       Social Determinants of Health (SDOH) Interventions SDOH Screenings   Food Insecurity: Food Insecurity Present (11/05/2022)  Housing: High Risk (11/05/2022)  Transportation Needs: Unmet Transportation Needs (11/05/2022)  Utilities: At Risk (11/05/2022)  Depression (PHQ2-9): High Risk (11/19/2021)  Tobacco Use: High Risk (11/04/2022)     Readmission Risk Interventions     No data to display

## 2022-11-08 ENCOUNTER — Other Ambulatory Visit (HOSPITAL_COMMUNITY): Payer: Self-pay

## 2022-11-30 ENCOUNTER — Emergency Department (HOSPITAL_BASED_OUTPATIENT_CLINIC_OR_DEPARTMENT_OTHER)
Admission: EM | Admit: 2022-11-30 | Discharge: 2022-11-30 | Disposition: A | Discharge status: Home / Self Care | Payer: Self-pay | Attending: Emergency Medicine | Admitting: Emergency Medicine

## 2022-11-30 ENCOUNTER — Other Ambulatory Visit: Payer: Self-pay

## 2022-11-30 ENCOUNTER — Encounter (HOSPITAL_BASED_OUTPATIENT_CLINIC_OR_DEPARTMENT_OTHER): Payer: Self-pay | Admitting: Radiology

## 2022-11-30 ENCOUNTER — Emergency Department (HOSPITAL_BASED_OUTPATIENT_CLINIC_OR_DEPARTMENT_OTHER): Payer: Self-pay

## 2022-11-30 DIAGNOSIS — W4904XA Ring or other jewelry causing external constriction, initial encounter: Secondary | ICD-10-CM | POA: Insufficient documentation

## 2022-11-30 DIAGNOSIS — S00551A Superficial foreign body of lip, initial encounter: Secondary | ICD-10-CM | POA: Insufficient documentation

## 2022-11-30 NOTE — ED Notes (Signed)
Discharge paperwork reviewed entirely with patient, including Rx's and follow up care. Pain was under control. Pt verbalized understanding as well as all parties involved. No questions or concerns voiced at the time of discharge. No acute distress noted.   Pt ambulated out to PVA without incident or assistance.  

## 2022-11-30 NOTE — Discharge Instructions (Addendum)
Please make sure you watch for signs of redness, warmth, swelling to the site.  You can take Tylenol for some discomfort after the removal of the ring.

## 2022-11-30 NOTE — ED Triage Notes (Signed)
Pt states she realized her lip piercing was stuck and she can't get it out. She doesn't want it to get infected and she needs it out. Pt states she is currently at [facility] and didn't have the tools she needed to get it out.

## 2022-11-30 NOTE — ED Provider Notes (Signed)
[facility] Provider Note   CSN: [id number] Arrival date & time: 11/30/22  1741     History  Chief Complaint  Patient presents with   stuck peircing    Rita Carey is a 42 y.o. female, no pertinent past medical history, who presents to the ED secondary to her lip piercing being stuck in in her left lip.  She states she has had this lip piercing for the last 3 years, and she would try to take it out today, and realize she could not take it out.  She wants to get it taken out before gets infected.  Denies any redness, swelling.  Just wants it out.    Home Medications Prior to Admission medications   Medication Sig Start Date End Date Taking? Authorizing Provider  acetaminophen (TYLENOL) 500 MG tablet Take 1 tablet (500 mg total) by mouth every 6 (six) hours as needed. 07/19/22   Redwine, Madison A, PA-C  ALPRAZolam (XANAX) 0.5 MG tablet Take 1 tablet (0.5 mg total) by mouth 3 (three) times daily as needed for sleep or anxiety. Patient taking differently: Take 1 mg by mouth daily. 07/14/22   Hosie Poisson, MD  EXCEDRIN MIGRAINE [phone] MG tablet Take 1 tablet by mouth every 6 (six) hours as needed for headache or migraine.    [provider]  folic acid (FOLVITE) 1 MG tablet Take 1 tablet (1 mg total) by mouth daily. 11/07/22   Shelly Coss, MD  gabapentin (NEURONTIN) 100 MG capsule Take 1 capsule (100 mg total) by mouth 3 (three) times daily. 11/07/22   Shelly Coss, MD  methadone (DOLOPHINE) 10 MG/ML solution Take 67 mg by mouth in the morning.    [provider]  Multiple Vitamin (MULTIVITAMIN WITH MINERALS) TABS tablet Take 1 tablet by mouth daily. 07/14/22   Hosie Poisson, MD  nicotine (NICODERM CQ - DOSED IN MG/24 HOURS) 21 mg/24hr patch Place 1 patch (21 mg total) onto the skin daily. 11/07/22   Shelly Coss, MD  thiamine (VITAMIN B-1) 100 MG tablet Take 1 tablet (100 mg total) by mouth daily. 11/07/22    Shelly Coss, MD      Allergies    Patient has no known allergies.    Review of Systems   Review of Systems  Skin:  Negative for wound.    Physical Exam Updated Vital Signs BP 127/81 (BP Location: Right Arm)   Pulse (!) 104   Temp 98.4 F (36.9 C) (Oral)   Resp 17   Ht 5' 4"$  (1.626 m)   Wt 49.5 kg   SpO2 100%   BMI 18.73 kg/m  Physical Exam Vitals and nursing note reviewed.  Constitutional:      General: She is not in acute distress.    Appearance: She is well-developed.  HENT:     Head: Normocephalic and atraumatic.     Mouth/Throat:     Comments: Piercings in bilateral lower lip, left piercing patient unable to remove.  No redness or swelling. Eyes:     General:        Right eye: No discharge.        Left eye: No discharge.     Conjunctiva/sclera: Conjunctivae normal.  Pulmonary:     Effort: No respiratory distress.  Neurological:     Mental Status: She is alert.     Comments: Clear speech.   Psychiatric:        Behavior: Behavior normal.  Thought Content: Thought content normal.     ED Results / Procedures / Treatments   Labs (all labs ordered are listed, but only abnormal results are displayed) Labs Reviewed - No data to display  EKG None  Radiology No results found.  Procedures Procedures    Medications Ordered in ED Medications - No data to display  ED Course/ Medical Decision Making/ A&P                             Medical Decision Making Patient is a 43 year old female, here for a lip piercing that she cannot get out, I utilized some scissors, and removed it.  There was no laceration that was created in the removal.  And she tolerated the procedure well.  This has been in place for a long time.  And there is no new wounds.  Discharged home With return precautions.   Final Clinical Impression(s) / ED Diagnoses Final diagnoses:  Ring or other jewelry causing external constriction, initial encounter    Rx / DC Orders ED  Discharge Orders     None         Amyrah Pinkhasov, Si Gaul, PA 11/30/22 2026    Tretha Sciara, MD 11/30/22 2329

## 2022-12-05 ENCOUNTER — Other Ambulatory Visit (HOSPITAL_COMMUNITY): Payer: Self-pay

## 2022-12-09 ENCOUNTER — Inpatient Hospital Stay: Payer: Self-pay | Admitting: Nurse Practitioner

## 2022-12-12 ENCOUNTER — Ambulatory Visit: Payer: Self-pay | Admitting: Physician Assistant

## 2022-12-12 ENCOUNTER — Encounter: Payer: Self-pay | Admitting: Physician Assistant

## 2022-12-12 VITALS — BP 114/74 | HR 114 | Ht 63.0 in | Wt 110.0 lb

## 2022-12-12 DIAGNOSIS — R000 Tachycardia, unspecified: Secondary | ICD-10-CM

## 2022-12-12 DIAGNOSIS — F5104 Psychophysiologic insomnia: Secondary | ICD-10-CM

## 2022-12-12 DIAGNOSIS — F1010 Alcohol abuse, uncomplicated: Secondary | ICD-10-CM

## 2022-12-12 DIAGNOSIS — E870 Hyperosmolality and hypernatremia: Secondary | ICD-10-CM

## 2022-12-12 DIAGNOSIS — R748 Abnormal levels of other serum enzymes: Secondary | ICD-10-CM

## 2022-12-12 DIAGNOSIS — F1410 Cocaine abuse, uncomplicated: Secondary | ICD-10-CM

## 2022-12-12 DIAGNOSIS — E559 Vitamin D deficiency, unspecified: Secondary | ICD-10-CM

## 2022-12-12 DIAGNOSIS — F17210 Nicotine dependence, cigarettes, uncomplicated: Secondary | ICD-10-CM

## 2022-12-12 DIAGNOSIS — F1110 Opioid abuse, uncomplicated: Secondary | ICD-10-CM

## 2022-12-12 DIAGNOSIS — F410 Panic disorder [episodic paroxysmal anxiety] without agoraphobia: Secondary | ICD-10-CM

## 2022-12-12 MED ORDER — FOLIC ACID 1 MG PO TABS: 1.0000 mg | ORAL_TABLET | Freq: Every day | ORAL | 1 refills | Status: AC

## 2022-12-12 MED ORDER — GABAPENTIN 100 MG PO CAPS: 100.0000 mg | ORAL_CAPSULE | Freq: Three times a day (TID) | ORAL | 1 refills | Status: AC

## 2022-12-12 MED ORDER — MIRTAZAPINE 30 MG PO TABS: 30.0000 mg | ORAL_TABLET | Freq: Every day | ORAL | 1 refills | Status: AC

## 2022-12-12 MED ORDER — CLONIDINE HCL 0.1 MG PO TABS: ORAL_TABLET | ORAL | 1 refills | Status: AC

## 2022-12-12 MED ORDER — NICOTINE 21 MG/24HR TD PT24: 21.0000 mg | MEDICATED_PATCH | Freq: Every day | TRANSDERMAL | 0 refills | Status: AC

## 2022-12-12 NOTE — Patient Instructions (Addendum)
You are going to take an increased dose of Remeron 30 mg at bedtime.  You can continue taking clonidine as well.  You are experiencing tachycardia, I encourage you to check your blood pressure and pulse once daily, keep a written log and have available for all office visits.  We will call you with your lab results, if your thyroid is normal, then we will start metoprolol at that time.  Kennieth Rad, PA-C Physician Assistant [facility] [email or web address]   Sinus Tachycardia  Sinus tachycardia is a fast heartbeat. In sinus tachycardia, the heart beats more than 100 times a minute. Sinus tachycardia starts in the part of the heart called the sinoatrial (SA) node. Sinus tachycardia may be harmless, or it may be a sign of a serious condition. What are the causes? This condition may be caused by: Exercise or exertion. A fever. Pain. Loss of body fluids (dehydration). Severe bleeding (hemorrhage). Anxiety and stress. Certain substances, including: Alcohol. Caffeine. Tobacco and nicotine products. Cold medicines. Illegal drugs. Medical conditions including: Heart disease. An infection. An overactive thyroid (hyperthyroidism). A lack of red blood cells (anemia). What are the signs or symptoms? Symptoms of this condition include: A feeling that the heart is beating fast or unevenly (palpitations). Suddenly noticing your heartbeat (cardiac awareness). Lightheadedness. Tiredness (fatigue). Shortness of breath. Chest pain. Nausea. Fainting. How is this diagnosed? This condition is diagnosed with: A physical exam. Tests or monitoring, such as: Blood tests. An electrocardiogram (ECG). This test measures the electrical activity of the heart. Ambulatory cardiac monitor. This records your heartbeats for 24 hours or more. You may be referred to a heart specialist (cardiologist). How is this treated? Treatment for this  condition depends on the cause. Treatment may involve: Treating the underlying condition. Taking new medicines or changing your current medicines as told by your health care provider. Making changes to your diet or lifestyle. Follow these instructions at home: Lifestyle  Do not use any products that contain nicotine or tobacco. These products include cigarettes, chewing tobacco, and vaping devices, such as e-cigarettes. If you need help quitting, ask your health care provider. Do not use illegal drugs, such as cocaine. Learn relaxation methods to help you when you get stressed or anxious. These include deep breathing. Avoid caffeine or other stimulants, including herbal stimulants that are found in energy drinks. Alcohol use  Do not drink alcohol if: Your health care provider tells you not to drink. You are pregnant, may be pregnant, or are planning to become pregnant. If you drink alcohol: Limit how much you have to: 0-1 drink a day for women. 0-2 drinks a day for men. Know how much alcohol is in your drink. In the [place] one drink equals one 12 oz bottle of beer (355 mL), one 5 oz glass of wine (148 mL), or one 1 oz glass of hard liquor (44 mL). General instructions Drink enough fluids to keep your urine pale yellow. Take over-the-counter and prescription medicines only as told by your health care provider. Ask your health care provider about taking vitamins, herbs, and supplements. Contact a health care provider if: You have vomiting or diarrhea that does not go away. You have a fever. You have weakness or dizziness. You feel faint. Get help right away if: You have pain in your chest, upper arms, jaw, or neck. You have palpitations that do not go away. Summary In sinus tachycardia, the heart beats more than 100 times a minute. Sinus tachycardia may be harmless, or  it may be a sign of a serious condition. Treatment for this condition depends on the cause or the underlying  condition. Get help right away if you have pain in your chest, upper arms, jaw, or neck. This information is not intended to replace advice given to you by your health care provider. Make sure you discuss any questions you have with your health care provider. Document Revised: 01/25/2022 Document Reviewed: 01/25/2022 Elsevier Patient Education  [place].

## 2022-12-12 NOTE — Progress Notes (Unsigned)
New Patient Office Visit  Subjective    Patient ID: Rita Carey, female    DOB: [birth year]  Age: 42 y.o. MRN: [id number]  CC:  Chief Complaint  Patient presents with   Medication Refill    C/o high pulse rate, anxiety  and unable to sleep     HPI Rita Carey states that she is currently being treated for substance abuse at [facility] residential treatment center.  States that she arrived 11/08/22.  States that she plans on transitioning to caring services at [facility], is just waiting for a bed.  Reports that she is having difficulty sleeping both falling asleep and staying asleep.  States that she does take the clonidine and  Remeron at bedtime.  States that previously benzodiazepines and Ambien offered relief.  .  States that she has been having episodes of elevated heart rate, states that she is drinking plenty of water, does feel anxious but states that she will have feelings of heart racing even when she is not feeling anxious.   Outpatient Encounter Medications as of 12/12/2022  Medication Sig   cloNIDine (CATAPRES) 0.1 MG tablet Take 1/2 - 1 full tab PO TID PRN   mirtazapine (REMERON) 30 MG tablet Take 1 tablet (30 mg total) by mouth at bedtime.   acetaminophen (TYLENOL) 500 MG tablet Take 1 tablet (500 mg total) by mouth every 6 (six) hours as needed.   EXCEDRIN MIGRAINE 250-250-65 MG tablet Take 1 tablet by mouth every 6 (six) hours as needed for headache or migraine.   folic acid (FOLVITE) 1 MG tablet Take 1 tablet (1 mg total) by mouth daily.   gabapentin (NEURONTIN) 100 MG capsule Take 1 capsule (100 mg total) by mouth 3 (three) times daily.   methadone (DOLOPHINE) 10 MG/ML solution Take 67 mg by mouth in the morning.   Multiple Vitamin (MULTIVITAMIN WITH MINERALS) TABS tablet Take 1 tablet by mouth daily.   nicotine (NICODERM CQ - DOSED IN MG/24 HOURS) 21 mg/24hr patch Place 1 patch (21 mg total) onto the skin daily.   [DISCONTINUED] ALPRAZolam (XANAX) 0.5 MG tablet  Take 1 tablet (0.5 mg total) by mouth 3 (three) times daily as needed for sleep or anxiety. (Patient taking differently: Take 1 mg by mouth daily.)   [DISCONTINUED] folic acid (FOLVITE) 1 MG tablet Take 1 tablet (1 mg total) by mouth daily.   [DISCONTINUED] gabapentin (NEURONTIN) 100 MG capsule Take 1 capsule (100 mg total) by mouth 3 (three) times daily.   [DISCONTINUED] nicotine (NICODERM CQ - DOSED IN MG/24 HOURS) 21 mg/24hr patch Place 1 patch (21 mg total) onto the skin daily.   [DISCONTINUED] thiamine (VITAMIN B-1) 100 MG tablet Take 1 tablet (100 mg total) by mouth daily.   No facility-administered encounter medications on file as of 12/12/2022.    Past Medical History:  Diagnosis Date   Anxiety    Anxiety    Depression    Insomnia    Polysubstance abuse ([place])    Seizures ([place])     Past Surgical History:  Procedure Laterality Date   BIOPSY  07/14/2022   Procedure: BIOPSY;  Surgeon: Ronnette Juniper, MD;  Location: [facility];  Service: Gastroenterology;;   COLONOSCOPY N/A 07/14/2022   Procedure: COLONOSCOPY;  Surgeon: Ronnette Juniper, MD;  Location: [facility];  Service: Gastroenterology;  Laterality: N/A;   NO PAST SURGERIES     SHOULDER SURGERY     WISDOM TOOTH EXTRACTION      History reviewed. No pertinent  family history.  Social History   Socioeconomic History   Marital status: Single    Spouse name: Not on file   Number of children: Not on file   Years of education: Not on file   Highest education level: Not on file  Occupational History   Not on file  Tobacco Use   Smoking status: Every Day    Packs/day: 1.00    Years: 11.00    Total pack years: 11.00    Types: Cigarettes   Smokeless tobacco: Never  Vaping Use   Vaping Use: Never used  Substance and Sexual Activity   Alcohol use: Not Currently    Alcohol/week: 21.0 standard drinks of alcohol    Types: 21 Standard drinks or equivalent per week    Comment: 23 days sober   Drug use: Yes    Types:  Methamphetamines    Comment: 23 days sober as of 11/30/2022   Sexual activity: Not Currently  Other Topics Concern   Not on file  Social History Narrative   Not on file   Social Determinants of Health   Financial Resource Strain: Not on file  Food Insecurity: No Food Insecurity (11/07/2022)   Hunger Vital Sign    Worried About Running Out of Food in the Last Year: Never true    Ran Out of Food in the Last Year: Never true  Recent Concern: Food Insecurity - Food Insecurity Present (11/05/2022)   Hunger Vital Sign    Worried About Charity fundraiser in the Last Year: Often true    Ran Out of Food in the Last Year: Often true  Transportation Needs: Unmet Transportation Needs (11/07/2022)   PRAPARE - Hydrologist (Medical): Yes    Lack of Transportation (Non-Medical): Yes  Physical Activity: Not on file  Stress: Not on file  Social Connections: Not on file  Intimate Partner Violence: Not At Risk (11/05/2022)   Humiliation, Afraid, Rape, and Kick questionnaire    Fear of Current or Ex-Partner: No    Emotionally Abused: No    Physically Abused: No    Sexually Abused: No    Review of Systems  Constitutional: Negative.   HENT: Negative.    Eyes:  Negative for blurred vision.  Respiratory:  Negative for shortness of breath.   Cardiovascular:  Negative for chest pain and palpitations.  Gastrointestinal: Negative.   Genitourinary: Negative.   Musculoskeletal: Negative.   Skin: Negative.   Neurological:  Negative for weakness and headaches.  Endo/Heme/Allergies: Negative.   Psychiatric/Behavioral:  Negative for depression. The patient is nervous/anxious and has insomnia.         Objective    BP 114/74 (BP Location: Left Arm, Patient Position: Sitting, Cuff Size: Normal)   Pulse (!) 114   Ht '5\' 3"'$  (1.6 m)   Wt 110 lb (49.9 kg)   LMP 11/15/2022   SpO2 99%   BMI 19.49 kg/m   Physical Exam Vitals and nursing note reviewed.  Constitutional:       Appearance: Normal appearance.  HENT:     Head: Normocephalic and atraumatic.     Right Ear: External ear normal.     Left Ear: External ear normal.     Nose: Nose normal.     Mouth/Throat:     Mouth: Mucous membranes are moist.     Pharynx: Oropharynx is clear.  Eyes:     Extraocular Movements: Extraocular movements intact.     Conjunctiva/sclera: Conjunctivae normal.  Pupils: Pupils are equal, round, and reactive to light.  Cardiovascular:     Rate and Rhythm: Regular rhythm. Tachycardia present.     Pulses: Normal pulses.     Heart sounds: Normal heart sounds.  Pulmonary:     Effort: Pulmonary effort is normal.     Breath sounds: Normal breath sounds.  Musculoskeletal:        General: Normal range of motion.     Cervical back: Normal range of motion and neck supple.  Skin:    General: Skin is warm and dry.  Neurological:     General: No focal deficit present.     Mental Status: She is alert and oriented to person, place, and time.  Psychiatric:        Mood and Affect: Mood normal.        Behavior: Behavior normal.        Thought Content: Thought content normal.        Judgment: Judgment normal.         Assessment & Plan:   Problem List Items Addressed This Visit       Other   Panic disorder - Primary   Relevant Medications   gabapentin (NEURONTIN) 100 MG capsule   cloNIDine (CATAPRES) 0.1 MG tablet   mirtazapine (REMERON) 30 MG tablet   Heroin abuse (H[facility])   Alcohol abuse   Relevant Medications   folic acid (FOLVITE) 1 MG tablet   Cocaine abuse ([facility])   Psychophysiological insomnia   Relevant Medications   cloNIDine (CATAPRES) 0.1 MG tablet   mirtazapine (REMERON) 30 MG tablet   nicotine (NICODERM CQ - DOSED IN MG/24 HOURS) 21 mg/24hr patch   Other Relevant Orders   Vitamin D, 25-hydroxy   Other Visit Diagnoses     Tachycardia       Relevant Orders   Thyroid Panel With TSH   Hypocalcemia       Relevant Orders   Comp. Metabolic Panel (12)    Hypernatremia       Hypermagnesemia       Elevated liver enzymes       Relevant Orders   Acute Viral Hepatitis (HAV, HBV, HCV)      1. Panic disorder Continue current regimen, increase Remeron 30 mg.  Patient education given on good sleep hygiene, coping skills.  Red flags given for prompt reevaluation.  Patient encouraged to follow-up with mobile unit in 2 weeks  Patient given appointment for future labs to be completed due to excessive vein scarring. - gabapentin (NEURONTIN) 100 MG capsule; Take 1 capsule (100 mg total) by mouth 3 (three) times daily.  Dispense: 90 capsule; Refill: 1 - cloNIDine (CATAPRES) 0.1 MG tablet; Take 1/2 - 1 full tab PO TID PRN  Dispense: 60 tablet; Refill: 1 - mirtazapine (REMERON) 30 MG tablet; Take 1 tablet (30 mg total) by mouth at bedtime.  Dispense: 30 tablet; Refill: 1  2. Psychophysiological insomnia  - cloNIDine (CATAPRES) 0.1 MG tablet; Take 1/2 - 1 full tab PO TID PRN  Dispense: 60 tablet; Refill: 1 - mirtazapine (REMERON) 30 MG tablet; Take 1 tablet (30 mg total) by mouth at bedtime.  Dispense: 30 tablet; Refill: 1 - nicotine (NICODERM CQ - DOSED IN MG/24 HOURS) 21 mg/24hr patch; Place 1 patch (21 mg total) onto the skin daily.  Dispense: 28 patch; Refill: 0 - Vitamin D, 25-hydroxy; Future  3. Tachycardia Patient education given on supportive care, red flags given for prompt reevaluation - Thyroid Panel With TSH;  Future  4. Hypocalcemia   - Comp. Metabolic Panel (12); Future  5. Hypernatremia   6. Hypermagnesemia   7. Elevated liver enzymes  - Acute Viral Hepatitis (HAV, HBV, HCV); Future  8. Cocaine abuse ([place]) Currently in substance abuse treatment program  9. Alcohol abuse Continue current regimen - folic acid (FOLVITE) 1 MG tablet; Take 1 tablet (1 mg total) by mouth daily.  Dispense: 30 tablet; Refill: 1  10. Heroin abuse ([place])   I have reviewed the patient's medical history (PMH, PSH, Social History, Family History,  Medications, and allergies) , and have been updated if relevant. I spent 30 minutes reviewing chart and  face to face time with patient.    Return in about 2 weeks (around 12/26/2022) for with [facility].   Loraine Grip Mayers, PA-C

## 2022-12-13 DIAGNOSIS — F1410 Cocaine abuse, uncomplicated: Secondary | ICD-10-CM | POA: Insufficient documentation

## 2022-12-13 DIAGNOSIS — F5104 Psychophysiologic insomnia: Secondary | ICD-10-CM | POA: Insufficient documentation

## 2022-12-13 DIAGNOSIS — F1010 Alcohol abuse, uncomplicated: Secondary | ICD-10-CM | POA: Insufficient documentation

## 2022-12-14 ENCOUNTER — Ambulatory Visit: Payer: Self-pay

## 2022-12-14 DIAGNOSIS — R000 Tachycardia, unspecified: Secondary | ICD-10-CM

## 2022-12-14 DIAGNOSIS — R748 Abnormal levels of other serum enzymes: Secondary | ICD-10-CM

## 2022-12-14 DIAGNOSIS — F5104 Psychophysiologic insomnia: Secondary | ICD-10-CM

## 2022-12-15 LAB — COMP. METABOLIC PANEL (12)
AST: 19 IU/L (ref 0–40)
Albumin/Globulin Ratio: 1.7 (ref 1.2–2.2)
Albumin: 4.6 g/dL (ref 3.9–4.9)
Alkaline Phosphatase: 99 IU/L (ref 44–121)
BUN/Creatinine Ratio: 19 (ref 9–23)
BUN: 12 mg/dL (ref 6–24)
Bilirubin Total: 0.2 mg/dL (ref 0.0–1.2)
Calcium: 9.5 mg/dL (ref 8.7–10.2)
Chloride: 102 mmol/L (ref 96–106)
Creatinine, Ser: 0.64 mg/dL (ref 0.57–1.00)
Globulin, Total: 2.7 g/dL (ref 1.5–4.5)
Glucose: 86 mg/dL (ref 70–99)
Potassium: 4.4 mmol/L (ref 3.5–5.2)
Sodium: 140 mmol/L (ref 134–144)
Total Protein: 7.3 g/dL (ref 6.0–8.5)
eGFR: 112 mL/min/{1.73_m2} (ref >59)

## 2022-12-15 LAB — THYROID PANEL WITH TSH
Free Thyroxine Index: 2.2 (ref 1.2–4.9)
T3 Uptake Ratio: 26 % (ref 24–39)
T4, Total: 8.4 ug/dL (ref 4.5–12.0)
TSH: 0.995 u[IU]/mL (ref 0.450–4.500)

## 2022-12-15 LAB — ACUTE VIRAL HEPATITIS (HAV, HBV, HCV)
HCV Ab: NONREACTIVE
Hep A IgM: NEGATIVE
Hep B C IgM: NEGATIVE
Hepatitis B Surface Ag: NEGATIVE

## 2022-12-15 LAB — VITAMIN D 25 HYDROXY (VIT D DEFICIENCY, FRACTURES): Vit D, 25-Hydroxy: 16.7 ng/mL — ABNORMAL LOW (ref 30.0–100.0)

## 2022-12-15 LAB — HCV INTERPRETATION

## 2022-12-15 MED ORDER — VITAMIN D (ERGOCALCIFEROL) 1.25 MG (50000 UNIT) PO CAPS: 50000.0000 [IU] | ORAL_CAPSULE | ORAL | 2 refills | Status: AC

## 2022-12-15 NOTE — Addendum Note (Signed)
Addended by: Kennieth Rad on: 12/15/2022 09:29 AM   Modules accepted: Orders
